# Patient Record
Sex: Female | Born: 1990
Health system: Southern US, Community
[De-identification: ages and names within clinical notes are randomized; demographics above are authoritative.]

## PROBLEM LIST (undated history)

## (undated) ENCOUNTER — Inpatient Hospital Stay (HOSPITAL_COMMUNITY): Payer: Self-pay

## (undated) DIAGNOSIS — O10019 Pre-existing essential hypertension complicating pregnancy, unspecified trimester: Secondary | ICD-10-CM

## (undated) DIAGNOSIS — R87629 Unspecified abnormal cytological findings in specimens from vagina: Secondary | ICD-10-CM

## (undated) DIAGNOSIS — E669 Obesity, unspecified: Secondary | ICD-10-CM

## (undated) DIAGNOSIS — I10 Essential (primary) hypertension: Secondary | ICD-10-CM

## (undated) DIAGNOSIS — O139 Gestational [pregnancy-induced] hypertension without significant proteinuria, unspecified trimester: Secondary | ICD-10-CM

## (undated) HISTORY — DX: Essential (primary) hypertension: I10

## (undated) HISTORY — DX: Gestational (pregnancy-induced) hypertension without significant proteinuria, unspecified trimester: O13.9

## (undated) HISTORY — DX: Obesity, unspecified: E66.9

## (undated) HISTORY — PX: LAPAROSCOPIC CHOLECYSTECTOMY W/ CHOLANGIOGRAPHY: SUR757

## (undated) HISTORY — DX: Unspecified abnormal cytological findings in specimens from vagina: R87.629

## (undated) HISTORY — DX: Pre-existing essential hypertension complicating pregnancy, unspecified trimester: O10.019

---
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
## 2009-08-12 ENCOUNTER — Inpatient Hospital Stay (HOSPITAL_COMMUNITY): Admission: AD | Admit: 2009-08-12 | Discharge: 2009-08-12 | Payer: Self-pay | Admitting: Obstetrics and Gynecology

## 2009-08-12 ENCOUNTER — Ambulatory Visit: Payer: Self-pay | Admitting: Nurse Practitioner

## 2009-08-15 ENCOUNTER — Inpatient Hospital Stay (HOSPITAL_COMMUNITY): Admission: AD | Admit: 2009-08-15 | Discharge: 2009-08-15 | Payer: Self-pay | Admitting: Obstetrics and Gynecology

## 2009-08-15 ENCOUNTER — Ambulatory Visit: Payer: Self-pay | Admitting: Nurse Practitioner

## 2009-08-22 ENCOUNTER — Ambulatory Visit (HOSPITAL_COMMUNITY): Admission: RE | Admit: 2009-08-22 | Discharge: 2009-08-22 | Payer: Self-pay | Admitting: Obstetrics and Gynecology

## 2009-08-22 ENCOUNTER — Ambulatory Visit: Payer: Self-pay | Admitting: Obstetrics and Gynecology

## 2009-08-22 ENCOUNTER — Inpatient Hospital Stay (HOSPITAL_COMMUNITY): Admission: AD | Admit: 2009-08-22 | Discharge: 2009-08-22 | Payer: Self-pay | Admitting: Family Medicine

## 2009-08-29 ENCOUNTER — Inpatient Hospital Stay (HOSPITAL_COMMUNITY): Admission: RE | Admit: 2009-08-29 | Discharge: 2009-08-29 | Payer: Self-pay | Admitting: Family Medicine

## 2009-08-29 ENCOUNTER — Ambulatory Visit: Payer: Self-pay | Admitting: Nurse Practitioner

## 2009-10-09 ENCOUNTER — Ambulatory Visit (HOSPITAL_COMMUNITY): Admission: RE | Admit: 2009-10-09 | Discharge: 2009-10-09 | Payer: Self-pay | Admitting: Family Medicine

## 2009-10-30 ENCOUNTER — Ambulatory Visit (HOSPITAL_COMMUNITY): Admission: RE | Admit: 2009-10-30 | Discharge: 2009-10-30 | Payer: Self-pay | Admitting: Family Medicine

## 2009-11-13 ENCOUNTER — Encounter: Payer: Self-pay | Admitting: Family Medicine

## 2009-11-13 ENCOUNTER — Ambulatory Visit (HOSPITAL_COMMUNITY): Admission: RE | Admit: 2009-11-13 | Discharge: 2009-11-13 | Payer: Self-pay | Admitting: Family Medicine

## 2009-12-04 ENCOUNTER — Ambulatory Visit (HOSPITAL_COMMUNITY)
Admission: RE | Admit: 2009-12-04 | Discharge: 2009-12-04 | Payer: Self-pay | Source: Home / Self Care | Admitting: Family Medicine

## 2009-12-04 ENCOUNTER — Encounter: Payer: Self-pay | Admitting: Family Medicine

## 2009-12-09 ENCOUNTER — Ambulatory Visit: Payer: Self-pay | Admitting: Family Medicine

## 2009-12-10 ENCOUNTER — Encounter: Payer: Self-pay | Admitting: Family Medicine

## 2009-12-15 ENCOUNTER — Inpatient Hospital Stay (HOSPITAL_COMMUNITY)
Admission: AD | Admit: 2009-12-15 | Discharge: 2009-12-15 | Payer: Self-pay | Source: Home / Self Care | Attending: Family Medicine | Admitting: Family Medicine

## 2009-12-25 ENCOUNTER — Encounter: Payer: Self-pay | Admitting: Family Medicine

## 2009-12-25 ENCOUNTER — Ambulatory Visit: Payer: Self-pay | Admitting: Family Medicine

## 2009-12-25 LAB — CONVERTED CEMR LAB
ALT: 18 units/L (ref 0–35)
AST: 13 units/L (ref 0–37)
Albumin: 3.4 g/dL — ABNORMAL LOW (ref 3.5–5.2)
Alkaline Phosphatase: 98 units/L (ref 39–117)
BUN: 6 mg/dL (ref 6–23)
CO2: 25 meq/L (ref 19–32)
Calcium: 9.1 mg/dL (ref 8.4–10.5)
Chloride: 104 meq/L (ref 96–112)
Creatinine, Ser: 0.66 mg/dL (ref 0.40–1.20)
Glucose, Bld: 80 mg/dL
Glucose, Bld: 138 mg/dL — ABNORMAL HIGH (ref 70–99)
Glucose, Urine, Semiquant: NEGATIVE
HCT: 32.3 % — ABNORMAL LOW (ref 36.0–46.0)
Hemoglobin: 11.9 g/dL
Hemoglobin: 10.6 g/dL — ABNORMAL LOW (ref 12.0–15.0)
MCHC: 32.8 g/dL (ref 30.0–36.0)
MCV: 94.7 fL (ref 78.0–100.0)
Platelets: 254 10*3/uL (ref 150–400)
Potassium: 3.8 meq/L (ref 3.5–5.3)
RBC: 3.41 M/uL — ABNORMAL LOW (ref 3.87–5.11)
RDW: 13.1 % (ref 11.5–15.5)
Sodium: 137 meq/L (ref 135–145)
Total Bilirubin: 0.3 mg/dL (ref 0.3–1.2)
Total Protein: 6.7 g/dL (ref 6.0–8.3)
WBC: 9.6 10*3/uL (ref 4.0–10.5)

## 2009-12-26 ENCOUNTER — Encounter: Payer: Self-pay | Admitting: Family Medicine

## 2009-12-26 ENCOUNTER — Ambulatory Visit: Payer: Self-pay | Admitting: Family Medicine

## 2009-12-26 LAB — CONVERTED CEMR LAB: Protein, 24H Urine: 45 mg/24hr — ABNORMAL LOW (ref 50–100)

## 2009-12-31 ENCOUNTER — Ambulatory Visit: Payer: Self-pay | Admitting: Family Medicine

## 2010-01-01 ENCOUNTER — Ambulatory Visit: Payer: Self-pay | Admitting: Family Medicine

## 2010-01-01 ENCOUNTER — Encounter: Payer: Self-pay | Admitting: Family Medicine

## 2010-01-01 DIAGNOSIS — O99810 Abnormal glucose complicating pregnancy: Secondary | ICD-10-CM | POA: Insufficient documentation

## 2010-01-09 ENCOUNTER — Ambulatory Visit
Admission: RE | Admit: 2010-01-09 | Discharge: 2010-01-09 | Payer: Self-pay | Source: Home / Self Care | Attending: Obstetrics and Gynecology | Admitting: Obstetrics and Gynecology

## 2010-01-09 LAB — POCT URINALYSIS DIPSTICK
Bilirubin Urine: NEGATIVE
Hemoglobin, Urine: NEGATIVE
Ketones, ur: NEGATIVE mg/dL
Nitrite: NEGATIVE
Protein, ur: NEGATIVE mg/dL
Specific Gravity, Urine: 1.025 (ref 1.005–1.030)
Urine Glucose, Fasting: NEGATIVE mg/dL
Urobilinogen, UA: 1 mg/dL (ref 0.0–1.0)
pH: 6 (ref 5.0–8.0)

## 2010-01-23 ENCOUNTER — Encounter: Payer: Self-pay | Admitting: Family Medicine

## 2010-01-23 ENCOUNTER — Ambulatory Visit
Admission: RE | Admit: 2010-01-23 | Discharge: 2010-01-23 | Payer: Self-pay | Source: Home / Self Care | Attending: Family Medicine | Admitting: Family Medicine

## 2010-01-23 ENCOUNTER — Encounter: Payer: Self-pay | Admitting: Obstetrics & Gynecology

## 2010-01-23 LAB — CONVERTED CEMR LAB
ALT: 17 units/L (ref 0–35)
AST: 13 units/L (ref 0–37)
Albumin: 3.7 g/dL (ref 3.5–5.2)
Alkaline Phosphatase: 108 units/L (ref 39–117)
BUN: 7 mg/dL (ref 6–23)
CO2: 25 meq/L (ref 19–32)
Calcium: 9.3 mg/dL (ref 8.4–10.5)
Chloride: 104 meq/L (ref 96–112)
Creatinine, Ser: 0.54 mg/dL (ref 0.40–1.20)
Creatinine, Urine: 61.2 mg/dL
Glucose, Bld: 76 mg/dL (ref 70–99)
HCT: 34.9 % — ABNORMAL LOW (ref 36.0–46.0)
HIV: NONREACTIVE
Hemoglobin: 11.4 g/dL — ABNORMAL LOW (ref 12.0–15.0)
MCHC: 32.7 g/dL (ref 30.0–36.0)
MCV: 95.4 fL (ref 78.0–100.0)
Platelets: 250 10*3/uL (ref 150–400)
Potassium: 4.1 meq/L (ref 3.5–5.3)
RBC: 3.66 M/uL — ABNORMAL LOW (ref 3.87–5.11)
RDW: 12.8 % (ref 11.5–15.5)
Sodium: 135 meq/L (ref 135–145)
Total Bilirubin: 0.4 mg/dL (ref 0.3–1.2)
Total Protein, Urine: <3
Total Protein: 6.8 g/dL (ref 6.0–8.3)
Uric Acid, Serum: 4.5 mg/dL (ref 2.4–7.0)
WBC: 8 10*3/uL (ref 4.0–10.5)

## 2010-01-24 ENCOUNTER — Encounter: Payer: Self-pay | Admitting: Obstetrics & Gynecology

## 2010-01-24 LAB — CONVERTED CEMR LAB
Collection Interval-CRCL: 24 hr
Creatinine 24 HR UR: 984 mg/24hr (ref 700–1800)
Creatinine Clearance: 127 mL/min — ABNORMAL HIGH (ref 75–115)
Creatinine, Urine: 75.7 mg/dL
Protein, 24H Urine: 39 mg/24hr — ABNORMAL LOW (ref 50–100)

## 2010-01-27 LAB — POCT URINALYSIS DIPSTICK
Bilirubin Urine: NEGATIVE
Hgb urine dipstick: NEGATIVE
Ketones, ur: NEGATIVE mg/dL
Nitrite: NEGATIVE
Protein, ur: NEGATIVE mg/dL
Specific Gravity, Urine: 1.01 (ref 1.005–1.030)
Urine Glucose, Fasting: NEGATIVE mg/dL
Urobilinogen, UA: 1 mg/dL (ref 0.0–1.0)
pH: 6.5 (ref 5.0–8.0)

## 2010-01-30 ENCOUNTER — Ambulatory Visit
Admission: RE | Admit: 2010-01-30 | Discharge: 2010-01-30 | Payer: Self-pay | Source: Home / Self Care | Attending: Obstetrics and Gynecology | Admitting: Obstetrics and Gynecology

## 2010-01-30 LAB — POCT URINALYSIS DIPSTICK
Bilirubin Urine: NEGATIVE
Hgb urine dipstick: NEGATIVE
Ketones, ur: NEGATIVE mg/dL
Nitrite: NEGATIVE
Protein, ur: NEGATIVE mg/dL
Specific Gravity, Urine: <=1.005 (ref 1.005–1.030)
Urine Glucose, Fasting: NEGATIVE mg/dL
Urobilinogen, UA: 0.2 mg/dL (ref 0.0–1.0)
pH: 5.5 (ref 5.0–8.0)

## 2010-01-31 ENCOUNTER — Ambulatory Visit (HOSPITAL_COMMUNITY)
Admission: RE | Admit: 2010-01-31 | Discharge: 2010-01-31 | Payer: Self-pay | Source: Home / Self Care | Attending: Obstetrics & Gynecology | Admitting: Obstetrics & Gynecology

## 2010-02-04 NOTE — Miscellaneous (Signed)
Summary: ROI  ROI   Imported By: De Nurse 12/12/2009 16:27:20  _____________________________________________________________________  External Attachment:    Type:   Image     Comment:   External Document

## 2010-02-04 NOTE — Assessment & Plan Note (Signed)
Summary: np,df   Vital Signs:  Patient profile:   20 year old female Height:      65 inches Weight:      245.5 pounds Pulse rate:   80 / minute BP sitting:   130 / 80  (right arm)  Vitals Entered By: Arlyss Repress CMA, (December 09, 2009 2:57 PM)  History of Present Illness: pt is seeking to establish/transfer care for her pregnancy.  she is a 20yo G1P0 at 21.6 wks by LMP c/w 17 wk sono without any acute complaints today.  she denies any contractions, bleeding, spotting, leakage of fluid, and notes good fetal movement.  she has been followed at the health department and per her report denies any complications with this pregnancy.   Past History:  Past Medical History: none  Past Surgical History: none  Family History: sis with preeclampsia/chronic hypertension  Social History: Alcohol use-no Drug use-no no tobaccoDrug Use:  no  Physical Exam  General:  Well-developed,well-nourished,in no acute distress; alert,appropriate and cooperative throughout examination Lungs:  Normal respiratory effort, chest expands symmetrically. Lungs are clear to auscultation, no crackles or wheezes. Heart:  Normal rate and regular rhythm. S1 and S2 normal without gallop, murmur, click, rub or other extra sounds. Abdomen:  Bowel sounds positive,abdomen soft and non-tender without masses, organomegaly or hernias noted.Gravid, FH 27-obese. FHR 150s Extremities:  No clubbing, cyanosis, edema, or deformity noted with normal full range of motion of all joints.     Impression & Recommendations:  Problem # 1:  INTRAUTERINE PREGNANCY (ICD-V22.2) Assessment New labs and ultrasound reviewed.  pt is at 21.6 wks by LMP c/w 17 wk ultrasound and most recent ultrasound from 11/30 with adequate growth and is consistent with LMP.  will have pt to establish care with continuity physician.  pt with borderline BP manual 132/84.  will have her to return in 2 weeks for BP check and ROB visit.  pt currently  asymptomatic.  will obtain medical records from the health department and will review at next visit.   Patient Instructions: 1)  It was a pleasure to meet with you today.  2)  Please make a follow up appointment in 2 weeks for BP check and ROB visit.  please assign continuity MD, either PGY1 or PGY2. thanks.  3)  Return to the Emergency department if with any contractions, bleeding, spotting, decreased fetal movement, leakage of fluid, or any other concerning symptoms.  4)  Please sign release of information for prenatal records from health department.    Orders Added: 1)  [facility]- New Level 3 [99203]     OB Initial Intake Information    Race: Black    Marital status: Single   Flowsheet View for Follow-up Visit    Weight:     245.5    Blood pressure:   130 / 80

## 2010-02-06 ENCOUNTER — Encounter: Payer: Self-pay | Admitting: Obstetrics & Gynecology

## 2010-02-06 ENCOUNTER — Other Ambulatory Visit: Payer: Self-pay

## 2010-02-06 DIAGNOSIS — IMO0002 Reserved for concepts with insufficient information to code with codable children: Secondary | ICD-10-CM

## 2010-02-06 DIAGNOSIS — O139 Gestational [pregnancy-induced] hypertension without significant proteinuria, unspecified trimester: Secondary | ICD-10-CM

## 2010-02-06 LAB — POCT URINALYSIS DIPSTICK
Hgb urine dipstick: NEGATIVE
Nitrite: NEGATIVE
Protein, ur: NEGATIVE mg/dL
Specific Gravity, Urine: 1.025 (ref 1.005–1.030)
Urine Glucose, Fasting: NEGATIVE mg/dL
Urobilinogen, UA: 1 mg/dL (ref 0.0–1.0)
pH: 6 (ref 5.0–8.0)

## 2010-02-06 LAB — CONVERTED CEMR LAB: TSH: 0.649 microintl units/mL (ref 0.350–4.500)

## 2010-02-06 NOTE — Assessment & Plan Note (Signed)
Summary: NOB/TRANSFER FROM HD/DSL   Vital Signs:  Patient profile:   20 year old female LMP:     07/09/2009 Height:      65 inches Weight:      245.5 pounds BMI:     41.00 Temp:     98.4 degrees F oral Pulse rate:   86 / minute BP sitting:   151 / 87  (left arm) Cuff size:   large  Vitals Entered By: Jimmy Footman, CMA (December 25, 2009 1:41 PM)  Serial Vital Signs/Assessments:  Time      Position  BP       Pulse  Resp  Temp     By                     142/76                         Lloyd Huger MD                     130/78                         Lloyd Huger MD  CC: New OB from HD/ 24    1/7 Is Patient Diabetic? No Pain Assessment Patient in pain? no       Have you ever been in a relationship where you felt threatened, hurt or afraid?No  LMP (date): 07/09/2009 EDC by LMP==> 04/15/2010 EDC 04/15/2010 LMP - Reliable? YES     On BCP's at conception: no Enter LMP: 07/09/2009   Primary Provider:  Maryelizabeth Kaufmann MD  CC:  New OB from HD/ 24    1/7.  History of Present Illness: 20 yo G1 at 24.1 weeks for routine OB visit. No complications thus far. On review of records, has had SBP ranging 136-138 and DBP 81-86 throughout first and second trimesters. Today she has elevated to 151/87 with manual recheck 142/76. Denies any HA, RUQ pain, pedal edema, visual changes.   Habits & Providers  Alcohol-Tobacco-Diet     Tobacco Status: never     Cigarette Packs/Day: n/a  Past History:  Past Medical History: Last updated: 12/09/2009 none  Past Surgical History: Last updated: 12/09/2009 none  Family History: Last updated: 12/09/2009 sis with preeclampsia/chronic hypertension  Social History: Last updated: 12/09/2009 Alcohol use-no Drug use-no no tobacco  Risk Factors: Smoking Status: never (12/25/2009) Packs/Day: n/a (12/25/2009)  Social History: Smoking Status:  never Occupation:  [employer] Hepatitis Risk:  no Packs/Day:  n/a  Review of Systems  The  patient denies anorexia, fever, weight loss, weight gain, chest pain, syncope, dyspnea on exertion, peripheral edema, prolonged cough, headaches, abdominal pain, and severe indigestion/heartburn.    Physical Exam  General:  Well-developed,well-nourished,in no acute distress; alert,appropriate and cooperative throughout examination Head:  normocephalic and atraumatic.   Eyes:  No corneal or conjunctival inflammation noted. EOMI. Perrla. Funduscopic exam benign, without hemorrhages, exudates or papilledema. Vision grossly normal. Lungs:  Normal respiratory effort, chest expands symmetrically. Lungs are clear to auscultation, no crackles or wheezes. Heart:  Normal rate and regular rhythm. S1 and S2 normal without gallop, murmur, click, rub or other extra sounds. Abdomen:  Gravid, Fundal height 25 cm. Nontender, BS+ Extremities:  No clubbing, cyanosis, edema, or deformity noted with normal full range of motion of all joints.   Neurologic:  No cranial nerve deficits noted. Station and gait are  normal. Plantar reflexes are down-going bilaterally. DTRs are symmetrical throughout. Sensory, motor and coordinative functions appear intact. Skin:  Intact without suspicious lesions or rashes Psych:  Cognition and judgment appear intact. Alert and cooperative with normal attention span and concentration. No apparent delusions, illusions, hallucinations  PHQ9 score is 1. Additional Exam:  (10/02/09 routine labs at HD): HIV NR, HepBsAg neg, RPR neg, blood type O+, Antibody screen negative, Rubella Imm, Gonorrhea/chlamydia negative, urine ctx no growth, 1hr GTT 80 Integrated screen Negative (10/30/09)   Impression & Recommendations:  Problem # 1:  INTRAUTERINE PREGNANCY (ICD-V22.2) Patient has hypertension, possibly pre-existing. No current symptoms preeclampsia, will check routine labs to rule out. Refer to [facility] for hypertension in pregnancy management. Patient will follow up tomorrow for work-in appointment  and blood pressure check. If protein is present in urine, will need 24 hour urine collection and closer follow up.   Orders: UA Glucose/Protein-[facility] (81002) Comp Met-[facility] ([id number]) CBC-[facility] (09811) Medicaid OB visit - [facility] (99213)Future Orders: Glucose 1 hr-[facility] (91478) ... 12/27/2009  Patient Instructions: 1)  Nice to meet you. 2)  Will schedule you for an appointment tomorrow to check blood pressure. It is on the borderline high.  3)  Your baby's heart tones are great!   Orders Added: 1)  UA Glucose/Protein-[facility] [81002] 2)  Comp Met-[facility] [[id number]] 3)  CBC-[facility] [85027] 4)  Glucose 1 hr-[facility] [82950] 5)  Medicaid OB visit - [facility] [[id number]]     OB Initial Intake Information    Positive HCG by: [facility]    Race: Black    Marital status: Single    Occupation: outside work    Type of work: [employer]    Number of children at home: 0  FOB Information    Husband/Father of baby: Marcia Brash    FOB occupation Landscaping    FOB Comments: Boyfriend, supportive  Menstrual History    LMP (date): 07/09/2009    EDC by LMP: 04/15/2010    Best Working EDC: 04/15/2010    LMP - Reliable? : YES    On BCP's at conception: no    Pre Pregnancy Weight: 244 lbs.    Symptoms since LMP: fatigue Prenatal Visit    FOB name: Marcia Brash [facility] Confirmation:    New working [facility]: 04/15/2010    LMP reliable? YES    Last menses onset (LMP) date: 07/09/2009    EDC by LMP: 04/15/2010 Ultrasound Dating Information:    First U/S on 08/29/2009   Gest age: 21.5   EDC: 04/09/2010.    Second U/S on 11/13/2009   Gest age: 40.6   EDC: 04/17/2010.   Genetic History     Thalassemia:     mother: no    Neural tube defect:   mother: no    Down's Syndrome:   mother: no    Tay-Sachs:     mother: no    Sickle Cell Dz/Trait:   mother: no    Hemophilia:     mother: no    Muscular Dystrophy:   mother: no    Cystic Fibrosis:   mother: no    Huntington's Dz:   mother: no    Mental Retardation:   mother: no    Fragile  X:     mother: no    Other Genetic or       Chromosomal Dz:   mother: no    Child with other       birth defect:     mother: no    >  3 spont. abortions:   mother: no    Hx of stillbirth:     mother: no  Infection Risk History    High Risk Hepatitis B: no    Immunized against Hepatitis B: yes    Exposure to TB: no    Patient with history of Genital Herpes: no    Sexual partner with history of Genital Herpes: no    History of STD (GC, Chlamydia, Syphilis, HPV): no    Rash, Viral, or Febrile Illness since LMP: no    Exposure to Cat Litter: no    Chicken Pox Immune Status: Hx of Disease: Immune    Occupational Exposure to Children: other  Environmental Exposures    Xray Exposure since LMP: no    Chemical or other exposure: no    Medication, drug, or alcohol use since LMP: no   Flowsheet View for Follow-up Visit    Estimated weeks of       gestation:     24 1/7    Weight:     245.5    Blood pressure:   151 / 87    Urine Protein:     trace    Urine Glucose:   negative    Headache:     No    Nausea/vomiting:   No    Edema:     0    Vaginal bleeding:   no    Vaginal discharge:   no    Fundal height:      25    FHR:       140    Fetal activity:     yes    Labor symptoms:   no    Fetal position:     N/A    Taking prenatal vits?   Y    Smoking:     n/a    Next visit:     4 wk    Laboratory Results   Urine Tests  Date/Time Received: December 25, 2009 2:35 PM  Date/Time Reported: December 25, 2009 2:57 PM   Routine Urinalysis   Glucose: negative   (Normal Range: Negative) Protein: trace   (Normal Range: Negative)    Comments: ...............test performed by......Marland KitchenBonnie A. [place]az[place], MLS (ASCP)cm   CBC   HGB:  11.9 g/dL   (Normal Range: 86.5-78.4 in Males, 12.0-15.0 in Females)     Prevention & Chronic Care Immunizations   Influenza vaccine: Not documented    Tetanus booster: Not documented    Pneumococcal vaccine: Not documented  Other Screening    Pap smear: Not documented   Pap smear action/deferral: Not indicated-other  (12/25/2009)   Smoking status: never  (12/25/2009)     Lab Results    Hgb:     11.9 g/dL    Glu:     80  mg/dL    LMP:      69/62/9528 Urinalysis:      Prot:     trace    Glu:     negative    Appended Document: NOB/TRANSFER FROM HD/DSL Patient chart and old records from Health Dept reviewed, case discussed with Dr. Cristal Ford.  Patient with history of borderline HTN, elevated initial pressure on today's visit.  She failed her 1hrGTT today.  I agree with checking pre-eclampsia labs today, recheck BP tomorrow as well.  Will refer to [facility] for evaluation of patient with suspected essential hypertension, check 3hrGTT and refer to [facility].

## 2010-02-06 NOTE — Letter (Signed)
Summary: *Referral Letter  [facility]  [address]   [place], [place] [address]   Phone: [phone]  Fax: [phone]    12/25/2009  Thank you in advance for agreeing to see my patient:  Carol Blair [address] [place], [place]  [address]  Phone: [phone]  Reason for Referral: Hypertension in Pregnancy  Carol Blair is a pleasant 19yo G1 with uncomplicated pregancy found to have elevated blood pressures consistently 130/80s range. Today in the office she has 151/87 with recheck 142/76. We are checking urine studies and routine preeclampsia labs, but would like her to be established in [facility] for her hypertension.  Current Medical Problems: 1)  INTRAUTERINE PREGNANCY (ICD-V22.2)   Current Medications: PNV   Past Medical History: 1)  none   Pertinent Labs: Prenatal labs negative.    Thank you again for agreeing to see our patient; please contact us if you have any further questions or need additional information.  Sincerely,  Lloyd Huger MD

## 2010-02-06 NOTE — Assessment & Plan Note (Signed)
Summary: F/U BP/[facility]   Vital Signs:  Patient profile:   20 year old female Height:      65 inches Weight:      247 pounds Temp:     97.7 degrees F oral Pulse rate:   68 / minute BP sitting:   133 / 80  Vitals Entered By: Tessie Fass CMA (December 26, 2009 3:31 PM) CC: F/U BP (24.2 wks) Pain Assessment Patient in pain? no        Primary Care Provider:  Maryelizabeth Kaufmann MD  CC:  F/U BP (24.2 wks).  History of Present Illness: 20 yo G1 at 24.2 weeks for follow up for elevated BP to  151/87 with manual recheck 142/76.Marland Kitchen No complications thus far and normal pre-ecclampsia labs. 24 hr urine done today. BP 133/80 today. Denies prior history of hypertension. Has had SBP ranging 136-138 and DBP 81-86 throughout first and second trimesters.    Denies any HA, RUQ pain, pedal edema, visual changes.   Habits & Providers  Alcohol-Tobacco-Diet     Cigarette Packs/Day: n/a  Physical Exam  Abdomen:  Gravid, Fundal height 25 cm. Nontender, BS+   Impression & Recommendations:  Problem # 1:  INTRAUTERINE PREGNANCY (ICD-V22.2) 19 yo G1 at 24.2 weeks with elevated BP. Normotensive (though still somewhat elevated for pregnancy) today. PIH labs wnl. 24 hour urine pending. Patient to follow up next week for blood pressure recheck and fetal dopplers, then follow up with [facility]. Also needs 3 hr GTT for failed one hour. Discussed with Dr. Mauricio Po who agrees with plan as outlined.   Orders: 24hr. Urine TP- [facility] [phone]) Medicaid OB visit - [facility] [phone])  Patient Instructions: 1)  Make an appointment for Wednesday  2)  If you have any bleeding or contractions go straight to [facility]  3)  Come in on Wednesday as well for your 3 hr glucose test.    Orders Added: 1)  24hr. Urine TP- [facility] [[id number]] 2)  Medicaid OB visit - [facility] [[id number]]     Flowsheet View for Follow-up Visit    Estimated weeks of       gestation:     24 2/7    Weight:     247    Blood pressure:   133 /  80    Hx headache?     No    Nausea/vomiting?   No    Edema?     0    Bleeding?     no    Leakage/discharge?   no    Fetal activity:       yes    Labor symptoms?   no    Fundal height:      25    FHR:       156    Fetal position:      N/A    Taking Vitamins?   Y    Smoking PPD:   n/a    Comment:     Follow up on Wednesday, then at [facility] clinic; 3 hr GTT Wednesday    Resident:     Wallene Huh

## 2010-02-06 NOTE — Assessment & Plan Note (Signed)
Summary: f/u HTN per Dr Wallene Huh   Vital Signs:  Patient profile:   20 year old female Height:      65 inches Weight:      247.6 pounds BMI:     41.35 Temp:     97.8 degrees F oral Pulse rate:   55 / minute BP sitting:   135 / 80  (left arm) Cuff size:   regular  Vitals Entered By: Jimmy Footman, CMA (December 31, 2009 10:58 AM) CC: HTN follow up Is Patient Diabetic? No Pain Assessment Patient in pain? no        Primary Care Carol Blair:  Carol Kaufmann MD  CC:  HTN follow up.  History of Present Illness: Pt was seen by Dr. Wallene Huh and had high BP the day of the visit. She has normal PIH labs and her 24 hour urine had low protein at 45. She has not had any headaches, RUQ, blurry vision, leg swelling or any other symptoms.  She has an appointment tomorrow to get her 3 hr GTT and an appointment with [facility] clinic on Jan 5th. BP is better today!  Habits & Providers  Alcohol-Tobacco-Diet     Tobacco Status: never     Cigarette Packs/Day: n/a  Current Medications (verified): 1)  Prenatal Vitamins .... Take 1 Daily While Pregnant and Breast Feeding.  Allergies (verified): No Known Drug Allergies  Review of Systems        vitals reviewed and pertinent negatives and positives seen in HPI   Physical Exam  General:  Well-developed,well-nourished,in no acute distress; alert,appropriate and cooperative throughout examination Abdomen:  gravid, 25 cm, no tenderness Psych:  Cognition and judgment appear intact. Alert and cooperative with normal attention span and concentration. No apparent delusions, illusions, hallucinations   Impression & Recommendations:  Problem # 1:  INTRAUTERINE PREGNANCY (ICD-V22.2) Assessment Improved There was concern for pre-eclampsia but the patient is not having any signs or symptoms of it now. Her BP is back down into her normal range (130's). She has never been told that she is hypertensive prior to pregnancy. She has a sister who had pre-eclampsia. She  is being evaluated at [facility] clinic on Jan 5th. Pt is also havnig her 3 hr GTT tomorrow. Pt given verbal instructions to follow up with coming appointments. She verbalized consent.   Orders: Medicaid OB visit - [facility] ([id number])  Complete Medication List: 1)  Prenatal Vitamins  .... Take 1 daily while pregnant and breast feeding.   Orders Added: 1)  Medicaid OB visit - [facility] [[id number]]     Flowsheet View for Follow-up Visit    Estimated weeks of       gestation:     25 0/7    Weight:     247.6    Blood pressure:   135 / 80    Hx headache?     No    Nausea/vomiting?   No    Edema?     0    Bleeding?     no    Leakage/discharge?   no    Fetal activity:       yes    Labor symptoms?   no    Fundal height:      25    FHR:       155    Fetal position:      N/A    Taking Vitamins?   Y    Smoking PPD:   n/a    Comment:  come in tomorrow for 3 GTT    Next visit:     1 d    Resident:     Clotilde Dieter

## 2010-02-12 ENCOUNTER — Encounter: Payer: Self-pay | Admitting: *Deleted

## 2010-02-20 ENCOUNTER — Other Ambulatory Visit: Payer: Self-pay

## 2010-02-20 DIAGNOSIS — O139 Gestational [pregnancy-induced] hypertension without significant proteinuria, unspecified trimester: Secondary | ICD-10-CM

## 2010-02-20 DIAGNOSIS — O0990 Supervision of high risk pregnancy, unspecified, unspecified trimester: Secondary | ICD-10-CM

## 2010-02-20 LAB — POCT URINALYSIS DIPSTICK
Bilirubin Urine: NEGATIVE
Ketones, ur: NEGATIVE mg/dL
Nitrite: NEGATIVE
Protein, ur: NEGATIVE mg/dL
Specific Gravity, Urine: <=1.005 (ref 1.005–1.030)
Urine Glucose, Fasting: NEGATIVE mg/dL
Urobilinogen, UA: 0.2 mg/dL (ref 0.0–1.0)
pH: 5.5 (ref 5.0–8.0)

## 2010-02-24 ENCOUNTER — Other Ambulatory Visit: Payer: Medicaid Other

## 2010-02-24 DIAGNOSIS — O139 Gestational [pregnancy-induced] hypertension without significant proteinuria, unspecified trimester: Secondary | ICD-10-CM

## 2010-02-27 ENCOUNTER — Other Ambulatory Visit: Payer: Self-pay | Admitting: Obstetrics & Gynecology

## 2010-02-27 ENCOUNTER — Other Ambulatory Visit: Payer: Medicaid Other

## 2010-02-27 ENCOUNTER — Other Ambulatory Visit: Payer: Self-pay

## 2010-02-27 DIAGNOSIS — O139 Gestational [pregnancy-induced] hypertension without significant proteinuria, unspecified trimester: Secondary | ICD-10-CM

## 2010-02-27 LAB — POCT URINALYSIS DIPSTICK
Bilirubin Urine: NEGATIVE
Hgb urine dipstick: NEGATIVE
Ketones, ur: NEGATIVE mg/dL
Nitrite: NEGATIVE
Protein, ur: NEGATIVE mg/dL
Specific Gravity, Urine: <=1.005 (ref 1.005–1.030)
Urine Glucose, Fasting: NEGATIVE mg/dL
Urobilinogen, UA: 0.2 mg/dL (ref 0.0–1.0)
pH: 6 (ref 5.0–8.0)

## 2010-03-03 ENCOUNTER — Other Ambulatory Visit: Payer: Medicaid Other

## 2010-03-03 DIAGNOSIS — O139 Gestational [pregnancy-induced] hypertension without significant proteinuria, unspecified trimester: Secondary | ICD-10-CM

## 2010-03-06 ENCOUNTER — Encounter (HOSPITAL_COMMUNITY): Payer: Self-pay

## 2010-03-06 ENCOUNTER — Ambulatory Visit (HOSPITAL_COMMUNITY)
Admission: RE | Admit: 2010-03-06 | Discharge: 2010-03-06 | Disposition: A | Discharge status: Home / Self Care | Payer: Medicaid Other | Source: Ambulatory Visit | Attending: Obstetrics & Gynecology | Admitting: Obstetrics & Gynecology

## 2010-03-06 ENCOUNTER — Other Ambulatory Visit: Payer: Self-pay

## 2010-03-06 ENCOUNTER — Other Ambulatory Visit: Payer: Medicaid Other

## 2010-03-06 DIAGNOSIS — O139 Gestational [pregnancy-induced] hypertension without significant proteinuria, unspecified trimester: Secondary | ICD-10-CM

## 2010-03-06 DIAGNOSIS — Z3689 Encounter for other specified antenatal screening: Secondary | ICD-10-CM | POA: Insufficient documentation

## 2010-03-06 LAB — POCT URINALYSIS DIPSTICK
Nitrite: NEGATIVE
Urobilinogen, UA: 1 mg/dL (ref 0.0–1.0)
pH: 7 (ref 5.0–8.0)

## 2010-03-10 ENCOUNTER — Other Ambulatory Visit: Payer: Medicaid Other

## 2010-03-10 DIAGNOSIS — O139 Gestational [pregnancy-induced] hypertension without significant proteinuria, unspecified trimester: Secondary | ICD-10-CM

## 2010-03-13 ENCOUNTER — Other Ambulatory Visit: Payer: Self-pay

## 2010-03-13 ENCOUNTER — Other Ambulatory Visit: Payer: Medicaid Other

## 2010-03-13 DIAGNOSIS — O165 Unspecified maternal hypertension, complicating the puerperium: Secondary | ICD-10-CM

## 2010-03-13 LAB — POCT URINALYSIS DIPSTICK
Hgb urine dipstick: NEGATIVE
Ketones, ur: NEGATIVE mg/dL
Nitrite: NEGATIVE
Protein, ur: NEGATIVE mg/dL
pH: 7.5 (ref 5.0–8.0)

## 2010-03-17 ENCOUNTER — Other Ambulatory Visit: Payer: Medicaid Other

## 2010-03-17 DIAGNOSIS — O139 Gestational [pregnancy-induced] hypertension without significant proteinuria, unspecified trimester: Secondary | ICD-10-CM

## 2010-03-18 LAB — URINALYSIS, ROUTINE W REFLEX MICROSCOPIC
Glucose, UA: NEGATIVE mg/dL
Hgb urine dipstick: NEGATIVE
Protein, ur: NEGATIVE mg/dL
pH: 7 (ref 5.0–8.0)

## 2010-03-20 ENCOUNTER — Encounter: Payer: Self-pay | Admitting: *Deleted

## 2010-03-20 ENCOUNTER — Other Ambulatory Visit: Payer: Medicaid Other

## 2010-03-20 ENCOUNTER — Other Ambulatory Visit: Payer: Self-pay

## 2010-03-20 DIAGNOSIS — O139 Gestational [pregnancy-induced] hypertension without significant proteinuria, unspecified trimester: Secondary | ICD-10-CM

## 2010-03-20 LAB — POCT URINALYSIS DIPSTICK
Glucose, UA: NEGATIVE mg/dL
Hgb urine dipstick: NEGATIVE
Ketones, ur: NEGATIVE mg/dL
Specific Gravity, Urine: 1.015 (ref 1.005–1.030)
Urobilinogen, UA: 1 mg/dL (ref 0.0–1.0)

## 2010-03-20 LAB — CONVERTED CEMR LAB
Chlamydia, DNA Probe: NEGATIVE
GC Probe Amp, Genital: NEGATIVE

## 2010-03-21 LAB — WET PREP, GENITAL

## 2010-03-21 LAB — URINALYSIS, ROUTINE W REFLEX MICROSCOPIC
Bilirubin Urine: NEGATIVE
Ketones, ur: 15 mg/dL — AB
Nitrite: NEGATIVE
Protein, ur: NEGATIVE mg/dL
Specific Gravity, Urine: >1.03 — ABNORMAL HIGH (ref 1.005–1.030)
Urobilinogen, UA: 4 mg/dL — ABNORMAL HIGH (ref 0.0–1.0)

## 2010-03-21 LAB — GC/CHLAMYDIA PROBE AMP, GENITAL: GC Probe Amp, Genital: NEGATIVE

## 2010-03-21 LAB — CBC
Hemoglobin: 11.8 g/dL — ABNORMAL LOW (ref 12.0–15.0)
Platelets: 283 10*3/uL (ref 150–400)
RBC: 3.84 MIL/uL — ABNORMAL LOW (ref 3.87–5.11)
WBC: 6.7 10*3/uL (ref 4.0–10.5)

## 2010-03-21 LAB — ABO/RH: ABO/RH(D): O POS

## 2010-03-21 LAB — HCG, QUANTITATIVE, PREGNANCY: hCG, Beta Chain, Quant, S: 434 m[IU]/mL — ABNORMAL HIGH (ref <5)

## 2010-03-24 ENCOUNTER — Other Ambulatory Visit: Payer: Medicaid Other

## 2010-03-24 DIAGNOSIS — O139 Gestational [pregnancy-induced] hypertension without significant proteinuria, unspecified trimester: Secondary | ICD-10-CM

## 2010-03-27 ENCOUNTER — Encounter: Payer: Self-pay | Admitting: Physician Assistant

## 2010-03-27 ENCOUNTER — Other Ambulatory Visit: Payer: Self-pay | Admitting: Obstetrics & Gynecology

## 2010-03-27 ENCOUNTER — Other Ambulatory Visit: Payer: Medicaid Other

## 2010-03-27 DIAGNOSIS — O139 Gestational [pregnancy-induced] hypertension without significant proteinuria, unspecified trimester: Secondary | ICD-10-CM

## 2010-03-27 DIAGNOSIS — Z331 Pregnant state, incidental: Secondary | ICD-10-CM

## 2010-03-27 DIAGNOSIS — E669 Obesity, unspecified: Secondary | ICD-10-CM

## 2010-03-27 DIAGNOSIS — O99210 Obesity complicating pregnancy, unspecified trimester: Secondary | ICD-10-CM

## 2010-03-27 LAB — POCT URINALYSIS DIP (DEVICE)
Protein, ur: NEGATIVE mg/dL
Urobilinogen, UA: 0.2 mg/dL (ref 0.0–1.0)
pH: 7 (ref 5.0–8.0)

## 2010-03-27 LAB — CONVERTED CEMR LAB

## 2010-03-31 ENCOUNTER — Ambulatory Visit (HOSPITAL_COMMUNITY): Payer: Medicaid Other

## 2010-03-31 ENCOUNTER — Ambulatory Visit (HOSPITAL_COMMUNITY)
Admission: RE | Admit: 2010-03-31 | Discharge: 2010-03-31 | Disposition: A | Discharge status: Home / Self Care | Payer: Medicaid Other | Source: Ambulatory Visit | Attending: Obstetrics & Gynecology | Admitting: Obstetrics & Gynecology

## 2010-03-31 ENCOUNTER — Other Ambulatory Visit: Payer: Medicaid Other

## 2010-03-31 DIAGNOSIS — O139 Gestational [pregnancy-induced] hypertension without significant proteinuria, unspecified trimester: Secondary | ICD-10-CM | POA: Insufficient documentation

## 2010-03-31 DIAGNOSIS — Z3689 Encounter for other specified antenatal screening: Secondary | ICD-10-CM | POA: Insufficient documentation

## 2010-04-03 ENCOUNTER — Other Ambulatory Visit: Payer: Self-pay | Admitting: Family Medicine

## 2010-04-03 ENCOUNTER — Inpatient Hospital Stay (HOSPITAL_COMMUNITY): Payer: Medicaid Other

## 2010-04-03 ENCOUNTER — Other Ambulatory Visit: Payer: Medicaid Other

## 2010-04-03 ENCOUNTER — Inpatient Hospital Stay (HOSPITAL_COMMUNITY)
Admission: AD | Admit: 2010-04-03 | Discharge: 2010-04-03 | Disposition: A | Discharge status: Home / Self Care | Payer: Medicaid Other | Source: Ambulatory Visit | Attending: Obstetrics & Gynecology | Admitting: Obstetrics & Gynecology

## 2010-04-03 DIAGNOSIS — O99210 Obesity complicating pregnancy, unspecified trimester: Secondary | ICD-10-CM

## 2010-04-03 DIAGNOSIS — O139 Gestational [pregnancy-induced] hypertension without significant proteinuria, unspecified trimester: Secondary | ICD-10-CM | POA: Insufficient documentation

## 2010-04-03 DIAGNOSIS — Z331 Pregnant state, incidental: Secondary | ICD-10-CM

## 2010-04-03 DIAGNOSIS — E669 Obesity, unspecified: Secondary | ICD-10-CM

## 2010-04-03 LAB — PROTEIN / CREATININE RATIO, URINE
Creatinine, Urine: 111.4 mg/dL
Protein Creatinine Ratio: 0.1 (ref 0.00–0.15)
Total Protein, Urine: 11 mg/dL

## 2010-04-03 LAB — COMPREHENSIVE METABOLIC PANEL
Albumin: 2.9 g/dL — ABNORMAL LOW (ref 3.5–5.2)
Alkaline Phosphatase: 122 U/L — ABNORMAL HIGH (ref 39–117)
BUN: 5 mg/dL — ABNORMAL LOW (ref 6–23)
Chloride: 103 mEq/L (ref 96–112)
Creatinine, Ser: 0.73 mg/dL (ref 0.4–1.2)
GFR calc non Af Amer: >60 mL/min (ref >60)
Glucose, Bld: 78 mg/dL (ref 70–99)
Potassium: 3.9 mEq/L (ref 3.5–5.1)
Total Bilirubin: 0.5 mg/dL (ref 0.3–1.2)

## 2010-04-03 LAB — CBC
HCT: 37 % (ref 36.0–46.0)
MCH: 30.8 pg (ref 26.0–34.0)
MCV: 95.1 fL (ref 78.0–100.0)
Platelets: 207 10*3/uL (ref 150–400)
RBC: 3.89 MIL/uL (ref 3.87–5.11)
WBC: 8.8 10*3/uL (ref 4.0–10.5)

## 2010-04-03 LAB — POCT URINALYSIS DIP (DEVICE)
Glucose, UA: NEGATIVE mg/dL
Hgb urine dipstick: NEGATIVE
Ketones, ur: NEGATIVE mg/dL
Protein, ur: NEGATIVE mg/dL
Specific Gravity, Urine: 1.01 (ref 1.005–1.030)
Urobilinogen, UA: 0.2 mg/dL (ref 0.0–1.0)

## 2010-04-07 ENCOUNTER — Other Ambulatory Visit: Payer: Medicaid Other

## 2010-04-07 DIAGNOSIS — O139 Gestational [pregnancy-induced] hypertension without significant proteinuria, unspecified trimester: Secondary | ICD-10-CM

## 2010-04-10 ENCOUNTER — Other Ambulatory Visit: Payer: Self-pay | Admitting: Obstetrics and Gynecology

## 2010-04-10 DIAGNOSIS — Z331 Pregnant state, incidental: Secondary | ICD-10-CM

## 2010-04-10 DIAGNOSIS — O139 Gestational [pregnancy-induced] hypertension without significant proteinuria, unspecified trimester: Secondary | ICD-10-CM

## 2010-04-10 LAB — POCT URINALYSIS DIP (DEVICE)
Ketones, ur: NEGATIVE mg/dL
Nitrite: NEGATIVE
Protein, ur: NEGATIVE mg/dL
Urobilinogen, UA: 0.2 mg/dL (ref 0.0–1.0)
pH: 6 (ref 5.0–8.0)

## 2010-04-14 ENCOUNTER — Other Ambulatory Visit: Payer: Medicaid Other

## 2010-04-14 DIAGNOSIS — O139 Gestational [pregnancy-induced] hypertension without significant proteinuria, unspecified trimester: Secondary | ICD-10-CM

## 2010-04-15 ENCOUNTER — Inpatient Hospital Stay (HOSPITAL_COMMUNITY)
Admission: RE | Admit: 2010-04-15 | Discharge: 2010-04-18 | DRG: 775 | Disposition: A | Discharge status: Home / Self Care | Payer: Medicaid Other | Source: Ambulatory Visit | Attending: Obstetrics & Gynecology | Admitting: Obstetrics & Gynecology

## 2010-04-15 DIAGNOSIS — O139 Gestational [pregnancy-induced] hypertension without significant proteinuria, unspecified trimester: Principal | ICD-10-CM | POA: Diagnosis present

## 2010-04-15 LAB — CBC
HCT: 35.6 % — ABNORMAL LOW (ref 36.0–46.0)
WBC: 8.3 10*3/uL (ref 4.0–10.5)

## 2010-04-15 LAB — RPR: RPR Ser Ql: NONREACTIVE

## 2010-04-16 DIAGNOSIS — O139 Gestational [pregnancy-induced] hypertension without significant proteinuria, unspecified trimester: Secondary | ICD-10-CM

## 2010-04-17 LAB — CBC
HCT: 30.9 % — ABNORMAL LOW (ref 36.0–46.0)
Hemoglobin: 10.1 g/dL — ABNORMAL LOW (ref 12.0–15.0)
MCH: 31.2 pg (ref 26.0–34.0)
MCHC: 32.7 g/dL (ref 30.0–36.0)

## 2010-04-22 ENCOUNTER — Ambulatory Visit (INDEPENDENT_AMBULATORY_CARE_PROVIDER_SITE_OTHER): Payer: Medicaid Other | Admitting: Family Medicine

## 2010-04-22 ENCOUNTER — Encounter: Payer: Self-pay | Admitting: Family Medicine

## 2010-04-22 ENCOUNTER — Telehealth: Payer: Self-pay | Admitting: *Deleted

## 2010-04-22 DIAGNOSIS — I10 Essential (primary) hypertension: Secondary | ICD-10-CM | POA: Insufficient documentation

## 2010-04-22 DIAGNOSIS — Z309 Encounter for contraceptive management, unspecified: Secondary | ICD-10-CM

## 2010-04-22 LAB — POCT UA - MICROSCOPIC ONLY

## 2010-04-22 LAB — CBC
MCV: 93.8 fL (ref 78.0–100.0)
Platelets: 305 10*3/uL (ref 150–400)
RBC: 3.7 MIL/uL — ABNORMAL LOW (ref 3.87–5.11)
WBC: 7.6 10*3/uL (ref 4.0–10.5)

## 2010-04-22 LAB — COMPREHENSIVE METABOLIC PANEL
Albumin: 3.5 g/dL (ref 3.5–5.2)
CO2: 25 mEq/L (ref 19–32)
Calcium: 10.8 mg/dL — ABNORMAL HIGH (ref 8.4–10.5)
Chloride: 104 mEq/L (ref 96–112)
Glucose, Bld: 93 mg/dL (ref 70–99)
Sodium: 140 mEq/L (ref 135–145)
Total Bilirubin: 0.4 mg/dL (ref 0.3–1.2)
Total Protein: 6.9 g/dL (ref 6.0–8.3)

## 2010-04-22 LAB — POCT URINALYSIS DIPSTICK
Ketones, UA: NEGATIVE
Protein, UA: NEGATIVE
Spec Grav, UA: 1.015

## 2010-04-22 NOTE — Progress Notes (Signed)
  Subjective:    Patient ID: Carol Blair, female    DOB: 1990/01/25, 20 y.o.   MRN: [id number]  HPI 1. HTN. Patient in clinic today after [facility] RN recommended evaluation of BP 156/102. Recently gave birth on 4/11 by SVD, discharged on 04/18/10. BP elevated since early 2nd trimester. Early pregnancy values 130s/80s. Antepartum and peripartum values 140s-150s/80-100. Patient was followed in high-risk clinic during pregnancy and monitored without antihypertensive agents necessary. Post-partum BP ranged from 137-154/83-104. Was given prescription for HCTZ 25 daily and only started this medication today after [facility] RN checked her BP. Not taking medications for pain. Denies visual changes, HA, swelling, SOB, abdominal pain, dizzyness, recent change in salt intake. No problems with her baby girl.    Review of Systems See HPI.    Objective:   Physical Exam  Vitals reviewed. Constitutional: She is oriented to person, place, and time. She appears well-developed and well-nourished. No distress.  HENT:  Head: Normocephalic and atraumatic.  Eyes: Conjunctivae and EOM are normal. Pupils are equal, round, and reactive to light.  Cardiovascular: Normal rate and regular rhythm.  Exam reveals no gallop.   No murmur heard. Pulmonary/Chest: Effort normal and breath sounds normal. No respiratory distress. She has no wheezes. She has no rales. She exhibits no tenderness.  Abdominal: Soft.  Musculoskeletal: She exhibits no edema and no tenderness.       Trace bilateral LE edema. 2+ DP pulses bilaterally.   Neurological: She is alert and oriented to person, place, and time. She exhibits normal muscle tone. Coordination normal.  Skin: She is not diaphoretic.          Assessment & Plan:

## 2010-04-22 NOTE — Patient Instructions (Signed)
Start taking HCTZ in the morning. Normal blood pressure in <140/90.  Please make nursing appointment for BP check on Thursday or Friday this week. Call clinic sooner if you have vision changes, headaches, swelling, shortness of breath, or stomach pains. Don't start birth control until 05/19/2010. See you on May 1!   Hypertension (High Blood Pressure) As your heart beats, it forces blood through your arteries. This force is your blood pressure. If the pressure is too high, it is called hypertension (HTN) or high blood pressure. HTN is dangerous because you may have it and not know it. High blood pressure may mean that your heart has to work harder to pump blood. Your arteries may be narrow or stiff. The extra work puts you at risk for heart disease, stroke, and other problems.  Blood pressure consists of two numbers, a higher number over a lower, 110/72, for example. It is stated as "110 over 72." The ideal is below 120 for the top number (systolic) and under 80 for the bottom (diastolic). Write down your blood pressure today. You should pay close attention to your blood pressure if you have certain conditions such as:  Heart failure.  Prior heart attack.   Diabetes   Chronic kidney disease.   Prior stroke.   Multiple risk factors for heart disease.   To see if you have HTN, your blood pressure should be measured while you are seated with your arm held at the level of the heart. It should be measured at least twice. A one-time elevated blood pressure reading (especially in the Emergency Department) does not mean that you need treatment. There may be conditions in which the blood pressure is different between your right and left arms. It is important to see your caregiver soon for a recheck. Most people have essential hypertension which means that there is not a specific cause. This type of high blood pressure may be lowered by changing lifestyle factors such as:  Stress.  Smoking.   Lack  of exercise.   Excessive weight.  Drug/tobacco/alcohol use.   Eating less salt.   Most people do not have symptoms from high blood pressure until it has caused damage to the body. Effective treatment can often prevent, delay or reduce that damage. TREATMENT Treatment for high blood pressure, when a cause has been identified, is directed at the cause. There are a large number of medications to treat HTN. These fall into several categories, and your caregiver will help you select the medicines that are best for you. Medications may have side effects. You should review side effects with your caregiver. If your blood pressure stays high after you have made lifestyle changes or started on medicines,   Your medication(s) may need to be changed.   Other problems may need to be addressed.   Be certain you understand your prescriptions, and know how and when to take your medicine.   Be sure to follow up with your caregiver within the time frame advised (usually within two weeks) to have your blood pressure rechecked and to review your medications.   If you are taking more than one medicine to lower your blood pressure, make sure you know how and at what times they should be taken. Taking two medicines at the same time can result in blood pressure that is too low.  SEEK IMMEDIATE MEDICAL CARE IF YOU DEVELOP:  A severe headache, blurred or changing vision, or confusion.   Unusual weakness or numbness, or a faint feeling.  Severe chest or abdominal pain, vomiting, or breathing problems.  MAKE SURE YOU:   Understand these instructions.   Will watch your condition.   Will get help right away if you are not doing well or get worse.  Document Released: 12/22/2004 Document Re-Released: 06/11/2009 [facility] Patient Information 2011 [place], [place].

## 2010-04-22 NOTE — Telephone Encounter (Signed)
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 , nurse  stating  BP reading today was 156/102. Patient was given HCTZ at hospital discharge after birth of baby but she has not started. Was instructed today to start . Patient denies headache, or visual changes. Does have some edema. Appointment is scheduled today for appointment with Dr. Cristal Ford.

## 2010-04-22 NOTE — Assessment & Plan Note (Addendum)
Goal is <140/90. No symptoms of pre-eclampsia currently. Will check UA, CBC and CMET to rule out. Likely has some pre-existing hypertension and has family history of sister having pre-eclampsia and subsequent chronic HTN. Advised to start HCTZ daily and f/u for BP check this week to evaluate therapeutic effect of medication. Routine post-partum visit on 05/06/10 scheduled. Return to care as stated or emergency care if swelling, HA, visual changes, SOB or any other concerning symptoms. PATP.

## 2010-04-24 ENCOUNTER — Ambulatory Visit (INDEPENDENT_AMBULATORY_CARE_PROVIDER_SITE_OTHER): Payer: Medicaid Other | Admitting: *Deleted

## 2010-04-24 VITALS — BP 142/88 | HR 68

## 2010-04-24 DIAGNOSIS — I10 Essential (primary) hypertension: Secondary | ICD-10-CM

## 2010-04-24 NOTE — Progress Notes (Signed)
In for BP check . She  Started HCTZ 04/22/2010. BP checked manually  using large adult cuff.  BP RA 146/88 and LA 142/88 pulse 68. Dr. Deirdre Priest notified.  advised for patient to check BP outside of office and bring log to next appointment. Has appointment scheduled 05/06/2010 .

## 2010-05-06 ENCOUNTER — Encounter: Payer: Self-pay | Admitting: Family Medicine

## 2010-05-06 ENCOUNTER — Ambulatory Visit (INDEPENDENT_AMBULATORY_CARE_PROVIDER_SITE_OTHER): Payer: Medicaid Other | Admitting: Family Medicine

## 2010-05-06 VITALS — BP 142/100 | HR 95 | Temp 98.1°F | Ht 65.0 in | Wt 234.6 lb

## 2010-05-06 DIAGNOSIS — I10 Essential (primary) hypertension: Secondary | ICD-10-CM

## 2010-05-06 DIAGNOSIS — Z309 Encounter for contraceptive management, unspecified: Secondary | ICD-10-CM

## 2010-05-06 LAB — POCT URINALYSIS DIPSTICK
Blood, UA: NEGATIVE
Ketones, UA: NEGATIVE
Protein, UA: NEGATIVE
Spec Grav, UA: 1.015
Urobilinogen, UA: 1

## 2010-05-06 LAB — COMPREHENSIVE METABOLIC PANEL
AST: 15 U/L (ref 0–37)
Albumin: 4.2 g/dL (ref 3.5–5.2)
Alkaline Phosphatase: 124 U/L — ABNORMAL HIGH (ref 39–117)
Potassium: 3.3 mEq/L — ABNORMAL LOW (ref 3.5–5.3)
Sodium: 138 mEq/L (ref 135–145)
Total Bilirubin: 0.5 mg/dL (ref 0.3–1.2)
Total Protein: 8.5 g/dL — ABNORMAL HIGH (ref 6.0–8.3)

## 2010-05-06 LAB — CBC
MCH: 30.1 pg (ref 26.0–34.0)
MCHC: 32 g/dL (ref 30.0–36.0)
Platelets: 378 10*3/uL (ref 150–400)
RDW: 13.1 % (ref 11.5–15.5)

## 2010-05-06 MED ORDER — AMLODIPINE BESYLATE 10 MG PO TABS: 10.0000 mg | ORAL_TABLET | Freq: Every day | ORAL | Status: DC

## 2010-05-06 NOTE — Progress Notes (Signed)
  Subjective:    Patient ID: Carol Blair, female    DOB: [birth year], 20 y.o.   MRN: [id number]  HPI 1. HTN. Gestational/?chronic HTN diagnosed during recent pregnancy. Did return for BP check recently. Was improved 146/82 after starting HCTZ. Has been taking medicine regularly, but not checking BP at home. Felt stomach cramps/spasm yesterday and took motrin but threw it up. Denies pain, SOB, dizzyness, HA, visual changes, increased urination. Has not started OCPs since postpartum discharge on 4/13 (gave birth on 4/11).    Review of Systems See HPI    Objective:   Physical Exam  Vitals reviewed. Constitutional: She is oriented to person, place, and time. She appears well-developed and well-nourished. No distress.  HENT:  Head: Normocephalic and atraumatic.  Eyes: EOM are normal. Pupils are equal, round, and reactive to light.  Cardiovascular: Normal rate, regular rhythm and normal heart sounds.   No murmur heard. Pulmonary/Chest: Effort normal and breath sounds normal. No respiratory distress. She has no wheezes. She has no rales.  Abdominal: Soft. She exhibits no distension. There is no tenderness.  Musculoskeletal: She exhibits no edema.  Neurological: She is alert and oriented to person, place, and time. No cranial nerve deficit.  Skin: She is not diaphoretic.  Psychiatric: She has a normal mood and affect.          Assessment & Plan:

## 2010-05-06 NOTE — Assessment & Plan Note (Signed)
Will check pre-eclampsia labs once more today (negative 4/17) to evaluate for late-onset pre-eclamptic syndrome or proteinuria. Plan to obtain 24-hr urine if protein is present. Will also start norvasc in addition to HCTZ. Follow up for BP check in one week. Goal <140/90. Will avoid OCPs due to increased risk of VTE with uncontrolled HTN currently.

## 2010-05-06 NOTE — Assessment & Plan Note (Signed)
Will avoid estrogen-containing agents. Discussed depo vs IUD. Patient not currently sexually active or interested in pursuiting these options. Will follow up as needed.

## 2010-05-06 NOTE — Patient Instructions (Signed)
Please start taking norvasc daily with your HCTZ for blood pressure. Schedule a blood pressure check in next week or call office with measurements from home.  Do not start your birth control pills. We should avoid estrogen in birth control with your high blood pressure. Can discuss depo or Mirena IUD as good options with fewer side effects. Please call office or go to [facility] hospital if you have headache, vision changes, swelling, or shortness of breath.

## 2010-05-07 ENCOUNTER — Emergency Department (HOSPITAL_COMMUNITY)
Admission: EM | Admit: 2010-05-07 | Discharge: 2010-05-07 | Disposition: A | Discharge status: Home / Self Care | Payer: Medicaid Other | Attending: Emergency Medicine | Admitting: Emergency Medicine

## 2010-05-07 ENCOUNTER — Emergency Department (HOSPITAL_COMMUNITY): Payer: Medicaid Other

## 2010-05-07 DIAGNOSIS — R112 Nausea with vomiting, unspecified: Secondary | ICD-10-CM | POA: Insufficient documentation

## 2010-05-07 DIAGNOSIS — Z79899 Other long term (current) drug therapy: Secondary | ICD-10-CM | POA: Insufficient documentation

## 2010-05-07 DIAGNOSIS — R1013 Epigastric pain: Secondary | ICD-10-CM | POA: Insufficient documentation

## 2010-05-07 DIAGNOSIS — I10 Essential (primary) hypertension: Secondary | ICD-10-CM | POA: Insufficient documentation

## 2010-05-07 DIAGNOSIS — K5900 Constipation, unspecified: Secondary | ICD-10-CM | POA: Insufficient documentation

## 2010-05-07 DIAGNOSIS — K8020 Calculus of gallbladder without cholecystitis without obstruction: Secondary | ICD-10-CM | POA: Insufficient documentation

## 2010-05-07 LAB — DIFFERENTIAL
Basophils Absolute: 0 10*3/uL (ref 0.0–0.1)
Eosinophils Relative: 0 % (ref 0–5)
Lymphocytes Relative: 29 % (ref 12–46)
Lymphs Abs: 2.2 10*3/uL (ref 0.7–4.0)
Monocytes Absolute: 0.5 10*3/uL (ref 0.1–1.0)
Monocytes Relative: 7 % (ref 3–12)
Neutro Abs: 4.8 10*3/uL (ref 1.7–7.7)

## 2010-05-07 LAB — URINALYSIS, ROUTINE W REFLEX MICROSCOPIC
Nitrite: NEGATIVE
Protein, ur: NEGATIVE mg/dL
Specific Gravity, Urine: 1.022 (ref 1.005–1.030)
Urobilinogen, UA: >8 mg/dL — ABNORMAL HIGH (ref 0.0–1.0)

## 2010-05-07 LAB — BASIC METABOLIC PANEL
CO2: 27 mEq/L (ref 19–32)
Calcium: 10.5 mg/dL (ref 8.4–10.5)
Creatinine, Ser: 0.79 mg/dL (ref 0.4–1.2)
GFR calc Af Amer: >60 mL/min (ref >60)
GFR calc non Af Amer: >60 mL/min (ref >60)
Sodium: 138 mEq/L (ref 135–145)

## 2010-05-07 LAB — CBC
HCT: 37.8 % (ref 36.0–46.0)
Hemoglobin: 12.7 g/dL (ref 12.0–15.0)
MCH: 30.8 pg (ref 26.0–34.0)
MCHC: 33.6 g/dL (ref 30.0–36.0)
MCV: 91.7 fL (ref 78.0–100.0)
RDW: 12.6 % (ref 11.5–15.5)

## 2010-05-07 LAB — HEPATIC FUNCTION PANEL
Alkaline Phosphatase: 145 U/L — ABNORMAL HIGH (ref 39–117)
Bilirubin, Direct: 0.4 mg/dL — ABNORMAL HIGH (ref 0.0–0.3)
Indirect Bilirubin: 0.5 mg/dL (ref 0.3–0.9)
Total Bilirubin: 0.9 mg/dL (ref 0.3–1.2)

## 2010-05-07 LAB — POCT PREGNANCY, URINE: Preg Test, Ur: NEGATIVE

## 2010-05-07 LAB — URINE MICROSCOPIC-ADD ON

## 2010-05-14 ENCOUNTER — Ambulatory Visit: Payer: Medicaid Other | Admitting: Obstetrics and Gynecology

## 2010-05-14 ENCOUNTER — Ambulatory Visit: Payer: Medicaid Other | Admitting: Obstetrics & Gynecology

## 2010-05-23 ENCOUNTER — Ambulatory Visit (INDEPENDENT_AMBULATORY_CARE_PROVIDER_SITE_OTHER): Payer: Medicaid Other | Admitting: Family Medicine

## 2010-05-23 ENCOUNTER — Telehealth: Payer: Self-pay | Admitting: Family Medicine

## 2010-05-23 ENCOUNTER — Encounter: Payer: Self-pay | Admitting: Family Medicine

## 2010-05-23 VITALS — BP 125/77 | HR 75 | Temp 97.9°F | Ht 65.0 in | Wt 235.2 lb

## 2010-05-23 DIAGNOSIS — I10 Essential (primary) hypertension: Secondary | ICD-10-CM

## 2010-05-23 DIAGNOSIS — K8020 Calculus of gallbladder without cholecystitis without obstruction: Secondary | ICD-10-CM | POA: Insufficient documentation

## 2010-05-23 NOTE — Assessment & Plan Note (Signed)
Pt's blood pressure is improved. Pt is taking BP pills and doing well with them.

## 2010-05-23 NOTE — Assessment & Plan Note (Signed)
Pt told that she had gallstones in the ED 05-07-10. She had labs that were abnormal at the time including elelvated liver enzymes and a low potassium. Plan to repeat those labs today.

## 2010-05-23 NOTE — Progress Notes (Signed)
Post-partum; Pt was not seen for pregnancy care other than the NOB visit. She was seen at the [facility] clinic the rest of the pregnancy. She was suppose to follow up with them for post partum care but for multiple reasons has not made it there. She comes in today with a h/o induction at 40.0 weeks, vaginal delivery, spotting for 2 weeks after the delivery and then a normal menstruation that started yesterday. She is not having sex now and is not taking her birth control pills. She says she has been abstinent since 2 months into the pregnancy. She has no discharge, she is having abdominal pain (see below), she is bottle feeding, sleeping 5 hours a night, feels happy (no depression), she starts back to work on Monday and is noticing some constipation (see below).  Abd pain: Pt is having some RUQ abdominal pain. She went to the ED on 05-07-10 and was told that she has gallstones. She was told to have surgery in 2-3 weeks but says that she can not get the surgery until some time in July due to her pregnancy medicaid not paying for it and having to wait until regular pregnancy kicks in. She was given Vicodin which she says helped some but is not taking care of all the pain. She is having some constipation with the medicine and is taking colace and bisacodyl for it.   HTN: Pt was diagnosed with HTN during the pregnancy although we suspect she had it before. When she first came to [place] in Dec she was already pregnant and hypertensive. She does not remember being told she was hypertensive before the pregnancy but since it has not resolved post-partum it was likely present before.   ROS: neg except as noted in HPI.   PE: Gen: holding and feeding baby.  GU: normal introitus, no erythema, no tears, no bleeding, uterus and adnexa are normal size.

## 2010-05-23 NOTE — Telephone Encounter (Signed)
Is asking to speak with Dr Clotilde Dieter about meds for her gall stones before she has surgery.  Wants to know if anything can be called in to [facility] [address] [facility]

## 2010-05-23 NOTE — Patient Instructions (Addendum)
Your blood pressure is better today. We are checking your blood today because your liver enzymes and your potassium levels were at of balance on 05-07-10.  Keep up the good work with the baby.

## 2010-05-24 LAB — COMPREHENSIVE METABOLIC PANEL
AST: 44 U/L — ABNORMAL HIGH (ref 0–37)
Albumin: 4.2 g/dL (ref 3.5–5.2)
Alkaline Phosphatase: 146 U/L — ABNORMAL HIGH (ref 39–117)
Glucose, Bld: 97 mg/dL (ref 70–99)
Potassium: 3.8 mEq/L (ref 3.5–5.3)
Sodium: 138 mEq/L (ref 135–145)
Total Bilirubin: 0.6 mg/dL (ref 0.3–1.2)
Total Protein: 7.7 g/dL (ref 6.0–8.3)

## 2010-05-26 ENCOUNTER — Encounter: Payer: Self-pay | Admitting: Family Medicine

## 2010-05-26 NOTE — Telephone Encounter (Signed)
Called and left a message.  Advised pt to stay away from Tylenol as her liver enzymes are already elevated.  Also, advised the patient to use motrin or aleve for the pain. Advised that she say away from spicy and oily/fatty foods.  She can call me or her PCP Dr. Cristal Ford back if she has any further questions.

## 2010-05-29 ENCOUNTER — Ambulatory Visit: Payer: Medicaid Other | Admitting: Family Medicine

## 2010-06-01 ENCOUNTER — Emergency Department (HOSPITAL_COMMUNITY)
Admission: EM | Admit: 2010-06-01 | Discharge: 2010-06-01 | Disposition: A | Discharge status: Home / Self Care | Payer: Medicaid Other | Attending: Emergency Medicine | Admitting: Emergency Medicine

## 2010-06-01 DIAGNOSIS — R1011 Right upper quadrant pain: Secondary | ICD-10-CM | POA: Insufficient documentation

## 2010-06-01 DIAGNOSIS — E669 Obesity, unspecified: Secondary | ICD-10-CM | POA: Insufficient documentation

## 2010-06-01 DIAGNOSIS — M549 Dorsalgia, unspecified: Secondary | ICD-10-CM | POA: Insufficient documentation

## 2010-06-01 DIAGNOSIS — K8020 Calculus of gallbladder without cholecystitis without obstruction: Secondary | ICD-10-CM | POA: Insufficient documentation

## 2010-06-01 DIAGNOSIS — I10 Essential (primary) hypertension: Secondary | ICD-10-CM | POA: Insufficient documentation

## 2010-06-01 LAB — URINE MICROSCOPIC-ADD ON

## 2010-06-01 LAB — DIFFERENTIAL
Basophils Absolute: 0 10*3/uL (ref 0.0–0.1)
Eosinophils Relative: 0 % (ref 0–5)
Lymphocytes Relative: 24 % (ref 12–46)
Monocytes Absolute: 0.4 10*3/uL (ref 0.1–1.0)
Monocytes Relative: 5 % (ref 3–12)
Neutro Abs: 5.4 10*3/uL (ref 1.7–7.7)

## 2010-06-01 LAB — URINALYSIS, ROUTINE W REFLEX MICROSCOPIC
Hgb urine dipstick: NEGATIVE
Nitrite: NEGATIVE
Protein, ur: NEGATIVE mg/dL
Specific Gravity, Urine: 1.02 (ref 1.005–1.030)
Urobilinogen, UA: 4 mg/dL — ABNORMAL HIGH (ref 0.0–1.0)

## 2010-06-01 LAB — CBC
HCT: 38.3 % (ref 36.0–46.0)
Hemoglobin: 13 g/dL (ref 12.0–15.0)
MCH: 30.4 pg (ref 26.0–34.0)
MCHC: 33.9 g/dL (ref 30.0–36.0)
RDW: 12.6 % (ref 11.5–15.5)

## 2010-06-01 LAB — COMPREHENSIVE METABOLIC PANEL
BUN: 7 mg/dL (ref 6–23)
CO2: 29 mEq/L (ref 19–32)
Calcium: 10.1 mg/dL (ref 8.4–10.5)
Creatinine, Ser: 0.67 mg/dL (ref 0.4–1.2)
GFR calc non Af Amer: >60 mL/min (ref >60)
Glucose, Bld: 99 mg/dL (ref 70–99)
Total Protein: 8.3 g/dL (ref 6.0–8.3)

## 2010-06-01 LAB — LIPASE, BLOOD: Lipase: 23 U/L (ref 11–59)

## 2010-06-01 LAB — POCT PREGNANCY, URINE: Preg Test, Ur: NEGATIVE

## 2010-06-04 ENCOUNTER — Ambulatory Visit: Payer: Medicaid Other | Admitting: Obstetrics and Gynecology

## 2010-06-04 ENCOUNTER — Encounter (INDEPENDENT_AMBULATORY_CARE_PROVIDER_SITE_OTHER): Payer: Self-pay | Admitting: General Surgery

## 2010-06-05 ENCOUNTER — Other Ambulatory Visit: Payer: Self-pay | Admitting: General Surgery

## 2010-06-05 ENCOUNTER — Encounter (HOSPITAL_COMMUNITY): Payer: Medicaid Other

## 2010-06-05 ENCOUNTER — Ambulatory Visit (HOSPITAL_COMMUNITY)
Admission: RE | Admit: 2010-06-05 | Discharge: 2010-06-05 | Disposition: A | Discharge status: Home / Self Care | Payer: Medicaid Other | Source: Ambulatory Visit | Attending: General Surgery | Admitting: General Surgery

## 2010-06-05 DIAGNOSIS — Z01811 Encounter for preprocedural respiratory examination: Secondary | ICD-10-CM | POA: Insufficient documentation

## 2010-06-05 DIAGNOSIS — I10 Essential (primary) hypertension: Secondary | ICD-10-CM | POA: Insufficient documentation

## 2010-06-05 DIAGNOSIS — K8020 Calculus of gallbladder without cholecystitis without obstruction: Secondary | ICD-10-CM

## 2010-06-05 DIAGNOSIS — I498 Other specified cardiac arrhythmias: Secondary | ICD-10-CM | POA: Insufficient documentation

## 2010-06-05 DIAGNOSIS — Z01818 Encounter for other preprocedural examination: Secondary | ICD-10-CM | POA: Insufficient documentation

## 2010-06-05 DIAGNOSIS — Z01810 Encounter for preprocedural cardiovascular examination: Secondary | ICD-10-CM | POA: Insufficient documentation

## 2010-06-05 DIAGNOSIS — Z01812 Encounter for preprocedural laboratory examination: Secondary | ICD-10-CM | POA: Insufficient documentation

## 2010-06-05 LAB — COMPREHENSIVE METABOLIC PANEL
Albumin: 3.7 g/dL (ref 3.5–5.2)
Alkaline Phosphatase: 132 U/L — ABNORMAL HIGH (ref 39–117)
BUN: 6 mg/dL (ref 6–23)
CO2: 30 mEq/L (ref 19–32)
Chloride: 99 mEq/L (ref 96–112)
GFR calc non Af Amer: >60 mL/min (ref >60)
Potassium: 3.4 mEq/L — ABNORMAL LOW (ref 3.5–5.1)
Total Bilirubin: 0.4 mg/dL (ref 0.3–1.2)

## 2010-06-05 LAB — SURGICAL PCR SCREEN: MRSA, PCR: NEGATIVE

## 2010-06-10 ENCOUNTER — Other Ambulatory Visit: Payer: Self-pay | Admitting: General Surgery

## 2010-06-10 ENCOUNTER — Ambulatory Visit (HOSPITAL_COMMUNITY): Payer: Medicaid Other

## 2010-06-10 ENCOUNTER — Ambulatory Visit (HOSPITAL_COMMUNITY)
Admission: RE | Admit: 2010-06-10 | Discharge: 2010-06-10 | Disposition: A | Discharge status: Home / Self Care | Payer: Medicaid Other | Source: Ambulatory Visit | Attending: General Surgery | Admitting: General Surgery

## 2010-06-10 DIAGNOSIS — R1011 Right upper quadrant pain: Secondary | ICD-10-CM | POA: Insufficient documentation

## 2010-06-10 DIAGNOSIS — K8020 Calculus of gallbladder without cholecystitis without obstruction: Secondary | ICD-10-CM | POA: Insufficient documentation

## 2010-06-10 DIAGNOSIS — E669 Obesity, unspecified: Secondary | ICD-10-CM | POA: Insufficient documentation

## 2010-06-10 DIAGNOSIS — I10 Essential (primary) hypertension: Secondary | ICD-10-CM | POA: Insufficient documentation

## 2010-06-10 HISTORY — PX: LAPAROSCOPIC CHOLECYSTECTOMY W/ CHOLANGIOGRAPHY: SUR757

## 2010-07-03 ENCOUNTER — Ambulatory Visit: Payer: Medicaid Other | Admitting: Family Medicine

## 2010-07-07 ENCOUNTER — Encounter (INDEPENDENT_AMBULATORY_CARE_PROVIDER_SITE_OTHER): Payer: Self-pay | Admitting: General Surgery

## 2010-07-10 ENCOUNTER — Encounter (INDEPENDENT_AMBULATORY_CARE_PROVIDER_SITE_OTHER): Payer: Self-pay | Admitting: General Surgery

## 2010-07-10 ENCOUNTER — Ambulatory Visit (INDEPENDENT_AMBULATORY_CARE_PROVIDER_SITE_OTHER): Payer: Medicaid Other | Admitting: General Surgery

## 2010-07-10 DIAGNOSIS — K8010 Calculus of gallbladder with chronic cholecystitis without obstruction: Secondary | ICD-10-CM

## 2010-07-10 DIAGNOSIS — E669 Obesity, unspecified: Secondary | ICD-10-CM

## 2010-07-10 DIAGNOSIS — I10 Essential (primary) hypertension: Secondary | ICD-10-CM

## 2010-07-10 NOTE — Patient Instructions (Addendum)
Can resume normal activities.  Avoid fatty, greasy foods.

## 2010-07-10 NOTE — Op Note (Signed)
NAMEZONNIQUE, NORKUS               ACCOUNT NO.:  [id number]  MEDICAL RECORD NO.:  [id number]  LOCATION:  DAYL                         FACILITY:  [facility]  PHYSICIAN:  Mary Sella. Andrey Campanile, MD     DATE OF BIRTH:  07-07-1990  DATE OF PROCEDURE:  06/10/2010 DATE OF DISCHARGE:  06/10/2010                              OPERATIVE REPORT   PREOPERATIVE DIAGNOSIS:  Symptomatic cholelithiasis.  POSTOPERATIVE DIAGNOSIS:  Symptomatic cholelithiasis.  PROCEDURE:  Laparoscopic cholecystectomy with intraoperative cholangiogram.  SURGEON:  Mary Sella. Andrey Campanile, MD  ASSISTANT SURGEON:  None.  ANESTHESIA:  General plus 30 cc of Exparel.  SPECIMEN:  Gallbladder.  ESTIMATED BLOOD LOSS:  Minimal.  FINDINGS:  Critical view was achieved.  The cholangiogram demonstrated prompt filling of the cystic duct, common hepatic, left and right hepatic ducts, and common bile duct and emptying into the duodenum. There was no evidence of filling defect.  INDICATIONS FOR PROCEDURE:  The patient is an obese 20 year old [place] American female who recently delivered a child about 21 weeks ago.  She has been having intermittent right upper quadrant pain.  This actually started during the pregnancy, but has worsened now that she has delivered.  The pain will start in the right upper quadrant and radiate to both sidesof her abdomen.  She will occasionally have nausea and vomiting.  It generally occurs postprandial.  She went to the ER where an ultrasound was done, which demonstrated multiple small gallstones. We discussed the risks and benefits of surgery including bleeding, infection, injury to surrounding structures, bile leak, prolonged diarrhea, DVT occurrence, injury to the common bile duct requiring major reconstructive bile duct surgery, need to convert to an open procedure, as well as failure to abdominal pain.  She elected to proceed to surgery.  DESCRIPTION OF PROCEDURE:  After obtaining informed consent, the  patient was brought to the operating room and placed supine on the operating table.  General endotracheal anesthesia was established.  A surgical time-out was performed.  Sequential compression devices were placed. Her abdomen was prepped and draped in the usual standard surgical fashion.  She received 2 g of cefoxitin prior to skin incision.  A surgical time-out was performed.  A 1 inch vertical infraumbilical incision was made with a #11 blade.  The fascia was grasped with Kocher and lifted anteriorly.  Next, the fascia was incised with a #11 blade and the abdominal cavity was entered.  Next, a pursestring suture consisting of 0-Vicryl on a UR-6 needle was placed from the fascial edges.  A Hasson trocar was directly placed into the abdominal cavity and secured with a pursestring suture.  Pneumoperitoneum was smoothly established up to a patient pressure of 15 mmHg.  Laparoscope was advanced and the abdominal cavity was surveilled.  There were no overt signs of abnormal pathology.  There were no signs of injury to surrounding structures.  She was then placed in reverse Trendelenburg and rotated slightly to the left.  Three additional 5 mm trocars were placed under direct visualization, one in the subxiphoid and two in the right hypochondrium, all under direct visualization.  The gallbladder was grasped and then lifted toward the right shoulder.  The neck was  grasped and retracted laterally.  I then incised the peritoneum overlying the gallbladder both medially and laterally with hook electrocautery.  Using a [employer], I dissected out 2 structures entering the gallbladder, one was the cystic duct and one was the cystic artery, each were circumferentially dissected out around with a [place] along with the aid of the hook electrocautery. There were no other structures entering the gallbladder.  A clip was placed on the distal cystic duct as it entered the gallbladder.   A small nick was made in the skin and the cholangiogram catheter was percutaneously advanced through the abdominal wall into the abdominal cavity.  I then partially transected the cystic duct and threaded the cholangiogram catheter into the cystic duct and secured it with a clip. The cholangiogram was performed with the results as described above. The fluoro machine was then brought out.  We then reestablished pneumoperitoneum.  I removed the clip, securing the cholangiogram catheter.  Both the clip and the cholangiogram catheter were discarded from the abdominal cavity.  Four clips were placed on the downside of the cystic duct.  I then transected it with Endoshears.  The cystic artery actually had an anterior and a posterior branch and each branch was very diminutive, each were about 1000 mm.  I put 1 clip on the anterior, 1 clip on the posterior and then used hook electrocautery to ligate the cystic artery distally.  I then rolled the gallbladder up out of the gallbladder fossa using hook electrocautery.  The posterior wall of the gallbladder was somewhat thin.  The gallbladder was entered with spillage of some bile.  However, there was no spillage of stones.  The gallbladder was eventually freed from the gallbladder fossa.  The camera was placed in the subxiphoid trocar and the specimen bag was advanced through the umbilical trocar.  The gallbladder was placed within the specimen bag and then the bag containing the gallbladder was removed from the abdominal cavity.  Pneumoperitoneum was reestablished.  I then obtained hemostasis with hook electrocautery in the gallbladder fossa. I then irrigated the right upper quadrant with 1 L of saline.  There was no evidence of bile leak.  There was no evidence of bleeding.  There was no evidence of spilled stones.  I removed this on trocar and tied down the previously placed pursestring suture, thus obliterating the fascial defect.  This was  confirmed laparoscopically.  There was no evidence of air leak at umbilicus.  I then injected Exparel preperitoneally and in subcutaneous tissue at the umbilicus as well as at all trocar sites. Pneumoperitoneum was released and the remaining trocars were removed. All skin incisions were closed with 4-0 Monocryl in a subcuticular fashion followed by application of benzoin, Steri-Strips, and sterile bandages.  All needle, instruments, and sponge counts were x2.  There were no immediate complications.  The patient tolerated the procedure well.     Mary Sella. Andrey Campanile, MD     EMW/MEDQ  D:  06/10/2010  T:  06/11/2010  Job:  [id number]  cc:   Lloyd Huger, MD  Electronically Signed by Gaynelle Adu M.D. on 07/10/2010 12:09:08 PM

## 2010-07-10 NOTE — Progress Notes (Signed)
Procedure: Laparoscopic cholecystectomy with intraoperative cholangiogram on June 10, 2010  History of Present Ilness: Patient comes in today for her first postoperative visit after undergoing the above mentioned procedure. She did quite well after surgery. She only took 3 pain pills. She denies any fevers, chills, nausea, vomiting, constipation, or abdominal pain. She had one episode of loose stool last week but that was after eating fried food. Her appetite is normal. She has no questions or concerns today.  Physical Exam: Well-developed well-nourished overweight African American female in no apparent distress Lungs are clear Regular rate and rhythm Abdomen is soft, nontender, nondistended. Well-healed trocar incisions. No signs of umbilical hernia. No scleral icterus or jaundice.  Pathology: Chronic cholecystitis, cholelithiasis, and cholesterolosis  Assessment and Plan: Status post laparoscopic cholecystectomy with interoperative cholangiogram for chronic cholecystitis doing well. I reviewed her surgical pathology with her. Since she is doing well we will see her on an as-needed basis.

## 2010-08-29 ENCOUNTER — Other Ambulatory Visit: Payer: Self-pay | Admitting: Family Medicine

## 2010-08-29 NOTE — Telephone Encounter (Signed)
Refill request

## 2010-09-02 NOTE — Telephone Encounter (Signed)
rx was sent to pharmacy in April . Called and pharmacist states they only had 3 refills listed. He will put in for 6 refills now.

## 2011-01-06 NOTE — L&D Delivery Note (Signed)
Delivery Note At 12:56 AM a viable and healthy female was delivered via Vaginal, Spontaneous Delivery (Presentation: ; Right Occiput Anterior) with light meconium.  APGAR: 9, 9; weight 6 lb 1.4 oz (2761 g).   Placenta status: Intact, Spontaneous.  Cord: 3 vessels with the following complications: None.    Anesthesia: None  Episiotomy: None Lacerations: None Suture Repair: n/a Est. Blood Loss (mL): 200  Mom to postpartum.  Baby to nursery-stable.  Simone Curia 09/16/2011, 5:13 AM  I have seen and examined this patient and I agree with the above. OVELL, ADKINSON 8:12 AM 09/16/2011

## 2011-02-04 ENCOUNTER — Other Ambulatory Visit: Payer: Self-pay | Admitting: Family Medicine

## 2011-02-11 ENCOUNTER — Ambulatory Visit (INDEPENDENT_AMBULATORY_CARE_PROVIDER_SITE_OTHER): Payer: Medicaid Other | Admitting: Family Medicine

## 2011-02-11 ENCOUNTER — Encounter: Payer: Self-pay | Admitting: Family Medicine

## 2011-02-11 VITALS — BP 152/79 | HR 79 | Temp 97.9°F | Ht 65.0 in | Wt 251.6 lb

## 2011-02-11 DIAGNOSIS — Z3201 Encounter for pregnancy test, result positive: Secondary | ICD-10-CM

## 2011-02-11 DIAGNOSIS — I10 Essential (primary) hypertension: Secondary | ICD-10-CM

## 2011-02-11 DIAGNOSIS — O10019 Pre-existing essential hypertension complicating pregnancy, unspecified trimester: Secondary | ICD-10-CM

## 2011-02-11 DIAGNOSIS — N912 Amenorrhea, unspecified: Secondary | ICD-10-CM

## 2011-02-11 DIAGNOSIS — O0990 Supervision of high risk pregnancy, unspecified, unspecified trimester: Secondary | ICD-10-CM | POA: Insufficient documentation

## 2011-02-11 DIAGNOSIS — O10919 Unspecified pre-existing hypertension complicating pregnancy, unspecified trimester: Secondary | ICD-10-CM

## 2011-02-11 DIAGNOSIS — Z3490 Encounter for supervision of normal pregnancy, unspecified, unspecified trimester: Secondary | ICD-10-CM

## 2011-02-11 HISTORY — DX: Pre-existing essential hypertension complicating pregnancy, unspecified trimester: O10.019

## 2011-02-11 LAB — POCT URINE PREGNANCY: Preg Test, Ur: POSITIVE

## 2011-02-11 MED ORDER — TH PRENATAL VITAMINS 28-0.8 MG PO TABS: 1.0000 | ORAL_TABLET | Freq: Every day | ORAL | Status: DC

## 2011-02-11 NOTE — Assessment & Plan Note (Signed)
Off meds due to pregnancy. No urinary or lab evidence of secondary causes. Strong family history.

## 2011-02-11 NOTE — Assessment & Plan Note (Signed)
BP elevated to previous levels of 150/79.  Last pregnancy patient was followed in [facility] clinic and never required treatment or developed pre-eclampsia. Will obtain OB panel labs today. Sent rx for PNVs. Will refer to high risk clinic for [facility].

## 2011-02-11 NOTE — Progress Notes (Signed)
  Subjective:    Patient ID: Carol Blair, female    DOB: September 06, 1990, 21 y.o.   MRN: [id number]  HPI  1. Positive pregnancy test. Has positive test at home and today in office.  LMP 12/25/10. Did not desire pregnancy now but does wish to keep it. States she used a condom and got pregnant. Actually is smiling and seems happy today. Delivered first daughter in April 2012. Doing well, mother is very supportive. Current FOB is a long-time family friend and also supportive. Denies any n/v, edema, bleeding.  2. HTN. Has not been taking medications since her cholecystectomy. HTN has been present since prior to first pregnancy last year. Her sister also has HTN. Has been feeling very well recently and is surprised BP is elevated. Denies HA, edema, CP.   Review of Systems See HPI otherwise negative.     Objective:   Physical Exam  Vitals reviewed. Constitutional: She is oriented to person, place, and time. She appears well-developed and well-nourished. No distress.  HENT:  Head: Normocephalic and atraumatic.  Eyes: EOM are normal. Pupils are equal, round, and reactive to light.  Cardiovascular: Normal rate, regular rhythm, normal heart sounds and intact distal pulses.   No murmur heard. Pulmonary/Chest: Effort normal and breath sounds normal. No respiratory distress. She has no wheezes. She has no rales.  Musculoskeletal: She exhibits no edema and no tenderness.  Neurological: She is alert and oriented to person, place, and time. No cranial nerve deficit. She exhibits normal muscle tone. Coordination normal.  Skin: No rash noted.  Psychiatric: She has a normal mood and affect.       Assessment & Plan:
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
## 2011-02-12 LAB — OBSTETRIC PANEL
Basophils Relative: 0 % (ref 0–1)
Hemoglobin: 11.5 g/dL — ABNORMAL LOW (ref 12.0–15.0)
Hepatitis B Surface Ag: NEGATIVE
Lymphs Abs: 2.9 10*3/uL (ref 0.7–4.0)
MCHC: 32 g/dL (ref 30.0–36.0)
Monocytes Relative: 6 % (ref 3–12)
Neutro Abs: 3.7 10*3/uL (ref 1.7–7.7)
Neutrophils Relative %: 52 % (ref 43–77)
RBC: 3.99 MIL/uL (ref 3.87–5.11)
Rh Type: POSITIVE
WBC: 7.1 10*3/uL (ref 4.0–10.5)

## 2011-02-12 LAB — HIV ANTIBODY (ROUTINE TESTING W REFLEX): HIV: NONREACTIVE

## 2011-02-26 ENCOUNTER — Ambulatory Visit (INDEPENDENT_AMBULATORY_CARE_PROVIDER_SITE_OTHER): Payer: Medicaid Other | Admitting: Family Medicine

## 2011-02-26 ENCOUNTER — Other Ambulatory Visit (HOSPITAL_COMMUNITY)
Admission: RE | Admit: 2011-02-26 | Discharge: 2011-02-26 | Disposition: A | Discharge status: Home / Self Care | Payer: Medicaid Other | Source: Ambulatory Visit | Attending: Family Medicine | Admitting: Family Medicine

## 2011-02-26 ENCOUNTER — Encounter: Payer: Self-pay | Admitting: Family Medicine

## 2011-02-26 ENCOUNTER — Other Ambulatory Visit: Payer: Self-pay | Admitting: Family Medicine

## 2011-02-26 DIAGNOSIS — R87810 Cervical high risk human papillomavirus (HPV) DNA test positive: Secondary | ICD-10-CM | POA: Insufficient documentation

## 2011-02-26 DIAGNOSIS — Z3682 Encounter for antenatal screening for nuchal translucency: Secondary | ICD-10-CM

## 2011-02-26 DIAGNOSIS — Z01419 Encounter for gynecological examination (general) (routine) without abnormal findings: Secondary | ICD-10-CM | POA: Insufficient documentation

## 2011-02-26 DIAGNOSIS — Z113 Encounter for screening for infections with a predominantly sexual mode of transmission: Secondary | ICD-10-CM | POA: Insufficient documentation

## 2011-02-26 DIAGNOSIS — I10 Essential (primary) hypertension: Secondary | ICD-10-CM

## 2011-02-26 DIAGNOSIS — O10019 Pre-existing essential hypertension complicating pregnancy, unspecified trimester: Secondary | ICD-10-CM

## 2011-02-26 DIAGNOSIS — Z23 Encounter for immunization: Secondary | ICD-10-CM

## 2011-02-26 LAB — OB RESULTS CONSOLE GC/CHLAMYDIA: Gonorrhea: NEGATIVE

## 2011-02-26 LAB — POCT URINALYSIS DIP (DEVICE)
Bilirubin Urine: NEGATIVE
pH: 6.5 (ref 5.0–8.0)

## 2011-02-26 MED ORDER — INFLUENZA VIRUS VACC SPLIT PF IM SUSP
0.5000 mL | INTRAMUSCULAR | Status: AC
  Administered 2011-02-26: 0.5 mL via INTRAMUSCULAR

## 2011-02-26 NOTE — Progress Notes (Signed)
First Trimester Screen March 20,2013 at 10 am.

## 2011-02-26 NOTE — Progress Notes (Signed)
   Subjective:    Carol Blair is a G2P1001 [redacted]w[redacted]d being seen today for her first obstetrical visit.  Her obstetrical history is significant for Hypertension. Patient does not intend to breast feed. Pregnancy history fully reviewed.  Patient reports no complaints.  Filed Vitals:   02/26/11 0810 02/26/11 0812  BP: 142/82 141/84  Pulse: 92   Temp: 96.8 F (36 C)   Weight: 246 lb 12.8 oz (111.948 kg)     HISTORY: OB History    Grav Para Term Preterm Abortions TAB SAB Ect Mult Living   2 1 1       1      # Outc Date GA Lbr Len/2nd Wgt Sex Del Anes PTL Lv   1 TRM 4/12 [redacted]w[redacted]d  6lb1.9oz(2.776kg) F SVD EPI  Yes   Comments: System Generated. Please review and update pregnancy details.   2 CUR              Past Medical History  Diagnosis Date  . Hypertension   . Obesity    Past Surgical History  Procedure Date  . Laparoscopic cholecystectomy w/ cholangiography 06/10/10    Dr Gaynelle Adu   Family History  Problem Relation Age of Onset  . Hypertension Sister   . Lupus Sister   . Diabetes Sister   . Cancer Maternal Grandmother     GI  . Cancer Maternal Aunt     Breast  . Cancer Maternal Uncle     colon     Exam    Uterine Size: size equals dates  Pelvic Exam:    Perineum: Normal Perineum   Vulva: normal, Bartholin's, Urethra, Skene's normal   Vagina:  normal mucosa       Cervix: multiparous appearance   Adnexa: normal adnexa   Bony Pelvis: average       Skin: normal coloration and turgor, no rashes    Neurologic: oriented, normal   Extremities: no deformities   HEENT sclera clear, anicteric   Mouth/Teeth mucous membranes moist, pharynx normal without lesions   Neck supple   Cardiovascular: regular rate and rhythm   Respiratory:  appears well, vitals normal, no respiratory distress, acyanotic, normal RR, ear and throat exam is normal, neck free of mass or lymphadenopathy, chest clear, no wheezing, crepitations, rhonchi, normal symmetric air entry   Abdomen:  soft, non-tender; bowel sounds normal; no masses,  no organomegaly          Assessment:    Pregnancy: G2P1001 Patient Active Problem List  Diagnoses  . Hypertension  . Supervision of high-risk pregnancy  . Chronic hypertension in pregnancy        Plan:     Initial labs drawn. Prenatal vitamins. Problem list reviewed and updated. Genetic Screening discussed First Screen: ordered.  Ultrasound discussed; fetal survey: discussed.  Follow up in 3 weeks. Baseline labs and 24 hour urine Pap and GC/Chlam today   Jyra Lagares S 02/26/2011

## 2011-02-26 NOTE — Patient Instructions (Addendum)
Pregnancy - First Trimester During sexual intercourse, millions of sperm go into the vagina. Only 1 sperm will penetrate and fertilize the female egg while it is in the Fallopian tube. One week later, the fertilized egg implants into the wall of the uterus. An embryo begins to develop into a baby. At 6 to 8 weeks, the eyes and face are formed and the heartbeat can be seen on ultrasound. At the end of 12 weeks (first trimester), all the baby's organs are formed. Now that you are pregnant, you will want to do everything you can to have a healthy baby. Two of the most important things are to get good prenatal care and follow your caregiver's instructions. Prenatal care is all the medical care you receive before the baby's birth. It is given to prevent, find, and treat problems during the pregnancy and childbirth. PRENATAL EXAMS  During prenatal visits, your weight, blood pressure and urine are checked. This is done to make sure you are healthy and progressing normally during the pregnancy.   A pregnant woman should gain 25 to 35 pounds during the pregnancy. However, if you are over weight or underweight, your caregiver will advise you regarding your weight.   Your caregiver will ask and answer questions for you.   Blood work, cervical cultures, other necessary tests and a Pap test are done during your prenatal exams. These tests are done to check on your health and the probable health of your baby. Tests are strongly recommended and done for HIV with your permission. This is the virus that causes AIDS. These tests are done because medications can be given to help prevent your baby from being born with this infection should you have been infected without knowing it. Blood work is also used to find out your blood type, previous infections and follow your blood levels (hemoglobin).   Low hemoglobin (anemia) is common during pregnancy. Iron and vitamins are given to help prevent this. Later in the pregnancy,  blood tests for diabetes will be done along with any other tests if any problems develop. You may need tests to make sure you and the baby are doing well.   You may need other tests to make sure you and the baby are doing well.  CHANGES DURING THE FIRST TRIMESTER (THE FIRST 3 MONTHS OF PREGNANCY) Your body goes through many changes during pregnancy. They vary from person to person. Talk to your caregiver about changes you notice and are concerned about. Changes can include:  Your menstrual period stops.   The egg and sperm carry the genes that determine what you look like. Genes from you and your partner are forming a baby. The female genes determine whether the baby is a boy or a girl.   Your body increases in girth and you may feel bloated.   Feeling sick to your stomach (nauseous) and throwing up (vomiting). If the vomiting is uncontrollable, call your caregiver.   Your breasts will begin to enlarge and become tender.   Your nipples may stick out more and become darker.   The need to urinate more. Painful urination may mean you have a bladder infection.   Tiring easily.   Loss of appetite.   Cravings for certain kinds of food.   At first, you may gain or lose a couple of pounds.   You may have changes in your emotions from day to day (excited to be pregnant or concerned something may go wrong with the pregnancy and baby).     You may have more vivid and strange dreams.  HOME CARE INSTRUCTIONS   It is very important to avoid all smoking, alcohol and un-prescribed drugs during your pregnancy. These affect the formation and growth of the baby. Avoid chemicals while pregnant to ensure the delivery of a healthy infant.   Start your prenatal visits by the 12th week of pregnancy. They are usually scheduled monthly at first, then more often in the last 2 months before delivery. Keep your caregiver's appointments. Follow your caregiver's instructions regarding medication use, blood and lab  tests, exercise, and diet.   During pregnancy, you are providing food for you and your baby. Eat regular, well-balanced meals. Choose foods such as meat, fish, milk and other low fat dairy products, vegetables, fruits, and whole-grain breads and cereals. Your caregiver will tell you of the ideal weight gain.   You can help morning sickness by keeping soda crackers at the bedside. Eat a couple before arising in the morning. You may want to use the crackers without salt on them.   Eating 4 to 5 small meals rather than 3 large meals a day also may help the nausea and vomiting.   Drinking liquids between meals instead of during meals also seems to help nausea and vomiting.   A physical sexual relationship may be continued throughout pregnancy if there are no other problems. Problems may be early (premature) leaking of amniotic fluid from the membranes, vaginal bleeding, or belly (abdominal) pain.   Exercise regularly if there are no restrictions. Check with your caregiver or physical therapist if you are unsure of the safety of some of your exercises. Greater weight gain will occur in the last 2 trimesters of pregnancy. Exercising will help:   Control your weight.   Keep you in shape.   Prepare you for labor and delivery.   Help you lose your pregnancy weight after you deliver your baby.   Wear a good support or jogging bra for breast tenderness during pregnancy. This may help if worn during sleep too.   Ask when prenatal classes are available. Begin classes when they are offered.   Do not use hot tubs, steam rooms or saunas.   Wear your seat belt when driving. This protects you and your baby if you are in an accident.   Avoid raw meat, uncooked cheese, cat litter boxes and soil used by cats throughout the pregnancy. These carry germs that can cause birth defects in the baby.   The first trimester is a good time to visit your dentist for your dental health. Getting your teeth cleaned is  OK. Use a softer toothbrush and brush gently during pregnancy.   Ask for help if you have financial, counseling or nutritional needs during pregnancy. Your caregiver will be able to offer counseling for these needs as well as refer you for other special needs.   Do not take any medications or herbs unless told by your caregiver.   Inform your caregiver if there is any mental or physical domestic violence.   Make a list of emergency phone numbers of family, friends, hospital, and police and fire departments.   Write down your questions. Take them to your prenatal visit.   Do not douche.   Do not cross your legs.   If you have to stand for long periods of time, rotate you feet or take small steps in a circle.   You may have more vaginal secretions that may require a sanitary pad. Do not use   tampons or scented sanitary pads.  MEDICATIONS AND DRUG USE IN PREGNANCY  Take prenatal vitamins as directed. The vitamin should contain 1 milligram of folic acid. Keep all vitamins out of reach of children. Only a couple vitamins or tablets containing iron may be fatal to a baby or young child when ingested.   Avoid use of all medications, including herbs, over-the-counter medications, not prescribed or suggested by your caregiver. Only take over-the-counter or prescription medicines for pain, discomfort, or fever as directed by your caregiver. Do not use aspirin, ibuprofen, or naproxen unless directed by your caregiver.   Let your caregiver also know about herbs you may be using.   Alcohol is related to a number of birth defects. This includes fetal alcohol syndrome. All alcohol, in any form, should be avoided completely. Smoking will cause low birth rate and premature babies.   Street or illegal drugs are very harmful to the baby. They are absolutely forbidden. A baby born to an addicted mother will be addicted at birth. The baby will go through the same withdrawal an adult does.   Let your  caregiver know about any medications that you have to take and for what reason you take them.  MISCARRIAGE IS COMMON DURING PREGNANCY A miscarriage does not mean you did something wrong. It is not a reason to worry about getting pregnant again. Your caregiver will help you with questions you may have. If you have a miscarriage, you may need minor surgery. SEEK MEDICAL CARE IF:  You have any concerns or worries during your pregnancy. It is better to call with your questions if you feel they cannot wait, rather than worry about them. SEEK IMMEDIATE MEDICAL CARE IF:   An unexplained oral temperature above 102 F (38.9 C) develops, or as your caregiver suggests.   You have leaking of fluid from the vagina (birth canal). If leaking membranes are suspected, take your temperature and inform your caregiver of this when you call.   There is vaginal spotting or bleeding. Notify your caregiver of the amount and how many pads are used.   You develop a bad smelling vaginal discharge with a change in the color.   You continue to feel sick to your stomach (nauseated) and have no relief from remedies suggested. You vomit blood or coffee ground-like materials.   You lose more than 2 pounds of weight in 1 week.   You gain more than 2 pounds of weight in 1 week and you notice swelling of your face, hands, feet, or legs.   You gain 5 pounds or more in 1 week (even if you do not have swelling of your hands, face, legs, or feet).   You get exposed to German measles and have never had them.   You are exposed to fifth disease or chickenpox.   You develop belly (abdominal) pain. Round ligament discomfort is a common non-cancerous (benign) cause of abdominal pain in pregnancy. Your caregiver still must evaluate this.   You develop headache, fever, diarrhea, pain with urination, or shortness of breath.   You fall or are in a car accident or have any kind of trauma.   There is mental or physical violence in  your home.  Document Released: 12/16/2000 Document Revised: 09/03/2010 Document Reviewed: 06/19/2008 [employer] Patient Information 2012 [employer]. Breastfeeding BENEFITS OF BREASTFEEDING For the baby  The first milk (colostrum) helps the baby's digestive system function better.   There are antibodies from the mother in the milk that help   the baby fight off infections.   The baby has a lower incidence of asthma, allergies, and SIDS (sudden infant death syndrome).   The nutrients in breast milk are better than formulas for the baby and helps the baby's brain grow better.   Babies who breastfeed have less gas, colic, and constipation.  For the mother  Breastfeeding helps develop a very special bond between mother and baby.   It is more convenient, always available at the correct temperature and cheaper than formula feeding.   It burns calories in the mother and helps with losing weight that was gained during pregnancy.   It makes the uterus contract back down to normal size faster and slows bleeding following delivery.   Breastfeeding mothers have a lower risk of developing breast cancer.  NURSE FREQUENTLY  A healthy, full-term baby may breastfeed as often as every hour or space his or her feedings to every 3 hours.   How often to nurse will vary from baby to baby. Watch your baby for signs of hunger, not the clock.   Nurse as often as the baby requests, or when you feel the need to reduce the fullness of your breasts.   Awaken the baby if it has been 3 to 4 hours since the last feeding.   Frequent feeding will help the mother make more milk and will prevent problems like sore nipples and engorgement of the breasts.  BABY'S POSITION AT THE BREAST  Whether lying down or sitting, be sure that the baby's tummy is facing your tummy.   Support the breast with 4 fingers underneath the breast and the thumb above. Make sure your fingers are well away from the nipple and baby's  mouth.   Stroke the baby's lips and cheek closest to the breast gently with your finger or nipple.   When the baby's mouth is open wide enough, place all of your nipple and as much of the dark area around the nipple as possible into your baby's mouth.   Pull the baby in close so the tip of the nose and the baby's cheeks touch the breast during the feeding.  FEEDINGS  The length of each feeding varies from baby to baby and from feeding to feeding.   The baby must suck about 2 to 3 minutes for your milk to get to him or her. This is called a "let down." For this reason, allow the baby to feed on each breast as long as he or she wants. Your baby will end the feeding when he or she has received the right balance of nutrients.   To break the suction, put your finger into the corner of the baby's mouth and slide it between his or her gums before removing your breast from his or her mouth. This will help prevent sore nipples.  REDUCING BREAST ENGORGEMENT  In the first week after your baby is born, you may experience signs of breast engorgement. When breasts are engorged, they feel heavy, warm, full, and may be tender to the touch. You can reduce engorgement if you:   Nurse frequently, every 2 to 3 hours. Mothers who breastfeed early and often have fewer problems with engorgement.   Place light ice packs on your breasts between feedings. This reduces swelling. Wrap the ice packs in a lightweight towel to protect your skin.   Apply moist hot packs to your breast for 5 to 10 minutes before each feeding. This increases circulation and helps the milk flow.     Gently massage your breast before and during the feeding.   Make sure that the baby empties at least one breast at every feeding before switching sides.   Use a breast pump to empty the breasts if your baby is sleepy or not nursing well. You may also want to pump if you are returning to work or or you feel you are getting engorged.   Avoid  bottle feeds, pacifiers or supplemental feedings of water or juice in place of breastfeeding.   Be sure the baby is latched on and positioned properly while breastfeeding.   Prevent fatigue, stress, and anemia.   Wear a supportive bra, avoiding underwire styles.   Eat a balanced diet with enough fluids.  If you follow these suggestions, your engorgement should improve in 24 to 48 hours. If you are still experiencing difficulty, call your lactation consultant or caregiver. IS MY BABY GETTING ENOUGH MILK? Sometimes, mothers worry about whether their babies are getting enough milk. You can be assured that your baby is getting enough milk if:  The baby is actively sucking and you hear swallowing.   The baby nurses at least 8 to 12 times in a 24 hour time period. Nurse your baby until he or she unlatches or falls asleep at the first breast (at least 10 to 20 minutes), then offer the second side.   The baby is wetting 5 to 6 disposable diapers (6 to 8 cloth diapers) in a 24 hour period by 5 to 6 days of age.   The baby is having at least 2 to 3 stools every 24 hours for the first few months. Breast milk is all the food your baby needs. It is not necessary for your baby to have water or formula. In fact, to help your breasts make more milk, it is best not to give your baby supplemental feedings during the early weeks.   The stool should be soft and yellow.   The baby should gain 4 to 7 ounces per week after he is 4 days old.  TAKE CARE OF YOURSELF Take care of your breasts by:  Bathing or showering daily.   Avoiding the use of soaps on your nipples.   Start feedings on your left breast at one feeding and on your right breast at the next feeding.   You will notice an increase in your milk supply 2 to 5 days after delivery. You may feel some discomfort from engorgement, which makes your breasts very firm and often tender. Engorgement "peaks" out within 24 to 48 hours. In the meantime, apply  warm moist towels to your breasts for 5 to 10 minutes before feeding. Gentle massage and expression of some milk before feeding will soften your breasts, making it easier for your baby to latch on. Wear a well fitting nursing bra and air dry your nipples for 10 to 15 minutes after each feeding.   Only use cotton bra pads.   Only use pure lanolin on your nipples after nursing. You do not need to wash it off before nursing.  Take care of yourself by:   Eating well-balanced meals and nutritious snacks.   Drinking milk, fruit juice, and water to satisfy your thirst (about 8 glasses a day).   Getting plenty of rest.   Increasing calcium in your diet (1200 mg a day).   Avoiding foods that you notice affect the baby in a bad way.  SEEK MEDICAL CARE IF:   You have any questions or difficulty   with breastfeeding.   You need help.   You have a hard, red, sore area on your breast, accompanied by a fever of 100.5 F (38.1 C) or more.   Your baby is too sleepy to eat well or is having trouble sleeping.   Your baby is wetting less than 6 diapers per day, by 5 days of age.   Your baby's skin or white part of his or her eyes is more yellow than it was in the hospital.   You feel depressed.  Document Released: 12/22/2004 Document Revised: 09/03/2010 Document Reviewed: 08/06/2008 [employer] Patient Information 2012 [employer]. 

## 2011-03-09 ENCOUNTER — Telehealth: Payer: Self-pay | Admitting: Family Medicine

## 2011-03-09 NOTE — Telephone Encounter (Signed)
Patient is calling to find what kind of cold medicine is ok to take since she is pregnant.

## 2011-03-10 NOTE — Telephone Encounter (Signed)
lvm and informed pt of the following that she can take for cold sx:  cepacol throat lozenges, cold-eeze (40mg  per day), flonase (rx), robitussin (alcohol free), tylenol (2 reg. Strength q 4 hours, 2 extra strength q 6 hours), vitamin c drops (1000 mg q day).Laureen Ochs, Viann Shove

## 2011-03-19 ENCOUNTER — Ambulatory Visit (INDEPENDENT_AMBULATORY_CARE_PROVIDER_SITE_OTHER): Payer: Medicaid Other | Admitting: Obstetrics & Gynecology

## 2011-03-19 ENCOUNTER — Telehealth: Payer: Self-pay | Admitting: *Deleted

## 2011-03-19 VITALS — BP 134/83 | HR 75 | Temp 97.7°F | Wt 240.8 lb

## 2011-03-19 DIAGNOSIS — O10019 Pre-existing essential hypertension complicating pregnancy, unspecified trimester: Secondary | ICD-10-CM

## 2011-03-19 DIAGNOSIS — O10919 Unspecified pre-existing hypertension complicating pregnancy, unspecified trimester: Secondary | ICD-10-CM

## 2011-03-19 LAB — COMPREHENSIVE METABOLIC PANEL
ALT: 20 U/L (ref 0–35)
AST: 16 U/L (ref 0–37)
Albumin: 3.8 g/dL (ref 3.5–5.2)
Alkaline Phosphatase: 88 U/L (ref 39–117)
BUN: 5 mg/dL — ABNORMAL LOW (ref 6–23)
CO2: 24 mEq/L (ref 19–32)
Calcium: 9.6 mg/dL (ref 8.4–10.5)
Chloride: 104 mEq/L (ref 96–112)
Creat: 0.64 mg/dL (ref 0.50–1.10)
Glucose, Bld: 89 mg/dL (ref 70–99)
Potassium: 3.9 mEq/L (ref 3.5–5.3)
Sodium: 138 mEq/L (ref 135–145)
Total Bilirubin: 0.5 mg/dL (ref 0.3–1.2)
Total Protein: 7.1 g/dL (ref 6.0–8.3)

## 2011-03-19 LAB — POCT URINALYSIS DIP (DEVICE)
Glucose, UA: NEGATIVE mg/dL
Nitrite: NEGATIVE
Protein, ur: 100 mg/dL — AB
Specific Gravity, Urine: 1.025 (ref 1.005–1.030)
Urobilinogen, UA: >=8 mg/dL (ref 0.0–1.0)
pH: 6.5 (ref 5.0–8.0)

## 2011-03-19 NOTE — Telephone Encounter (Signed)
Telephoned pt's home # left message to return call to clinic.

## 2011-03-19 NOTE — Progress Notes (Signed)
Pulse: 75

## 2011-03-19 NOTE — Telephone Encounter (Signed)
Message copied by Lynnell Dike on Thu Mar 19, 2011  8:33 AM ------      Message from: Reva Bores      Created: Tue Mar 17, 2011 11:42 AM       May treat with Flagyl 2 gms po, have her get partner treated      ----- Message -----         From: Lurlean Horns, LPN         Sent: 03/17/2011  10:53 AM           To: Reva Bores, MD            Please review abnormal pap and pap also showed trichmomonas. Please advise.

## 2011-03-19 NOTE — Patient Instructions (Signed)
Breastfeeding BENEFITS OF BREASTFEEDING For the baby  The first milk (colostrum) helps the baby's digestive system function better.   There are antibodies from the mother in the milk that help the baby fight off infections.   The baby has a lower incidence of asthma, allergies, and SIDS (sudden infant death syndrome).   The nutrients in breast milk are better than formulas for the baby and helps the baby's brain grow better.   Babies who breastfeed have less gas, colic, and constipation.  For the mother  Breastfeeding helps develop a very special bond between mother and baby.   It is more convenient, always available at the correct temperature and cheaper than formula feeding.   It burns calories in the mother and helps with losing weight that was gained during pregnancy.   It makes the uterus contract back down to normal size faster and slows bleeding following delivery.   Breastfeeding mothers have a lower risk of developing breast cancer.  NURSE FREQUENTLY  A healthy, full-term baby may breastfeed as often as every hour or space his or her feedings to every 3 hours.   How often to nurse will vary from baby to baby. Watch your baby for signs of hunger, not the clock.   Nurse as often as the baby requests, or when you feel the need to reduce the fullness of your breasts.   Awaken the baby if it has been 3 to 4 hours since the last feeding.   Frequent feeding will help the mother make more milk and will prevent problems like sore nipples and engorgement of the breasts.  BABY'S POSITION AT THE BREAST  Whether lying down or sitting, be sure that the baby's tummy is facing your tummy.   Support the breast with 4 fingers underneath the breast and the thumb above. Make sure your fingers are well away from the nipple and baby's mouth.   Stroke the baby's lips and cheek closest to the breast gently with your finger or nipple.   When the baby's mouth is open wide enough, place all  of your nipple and as much of the dark area around the nipple as possible into your baby's mouth.   Pull the baby in close so the tip of the nose and the baby's cheeks touch the breast during the feeding.  FEEDINGS  The length of each feeding varies from baby to baby and from feeding to feeding.   The baby must suck about 2 to 3 minutes for your milk to get to him or her. This is called a "let down." For this reason, allow the baby to feed on each breast as long as he or she wants. Your baby will end the feeding when he or she has received the right balance of nutrients.   To break the suction, put your finger into the corner of the baby's mouth and slide it between his or her gums before removing your breast from his or her mouth. This will help prevent sore nipples.  REDUCING BREAST ENGORGEMENT  In the first week after your baby is born, you may experience signs of breast engorgement. When breasts are engorged, they feel heavy, warm, full, and may be tender to the touch. You can reduce engorgement if you:   Nurse frequently, every 2 to 3 hours. Mothers who breastfeed early and often have fewer problems with engorgement.   Place light ice packs on your breasts between feedings. This reduces swelling. Wrap the ice packs in a   lightweight towel to protect your skin.   Apply moist hot packs to your breast for 5 to 10 minutes before each feeding. This increases circulation and helps the milk flow.   Gently massage your breast before and during the feeding.   Make sure that the baby empties at least one breast at every feeding before switching sides.   Use a breast pump to empty the breasts if your baby is sleepy or not nursing well. You may also want to pump if you are returning to work or or you feel you are getting engorged.   Avoid bottle feeds, pacifiers or supplemental feedings of water or juice in place of breastfeeding.   Be sure the baby is latched on and positioned properly while  breastfeeding.   Prevent fatigue, stress, and anemia.   Wear a supportive bra, avoiding underwire styles.   Eat a balanced diet with enough fluids.  If you follow these suggestions, your engorgement should improve in 24 to 48 hours. If you are still experiencing difficulty, call your lactation consultant or caregiver. IS MY BABY GETTING ENOUGH MILK? Sometimes, mothers worry about whether their babies are getting enough milk. You can be assured that your baby is getting enough milk if:  The baby is actively sucking and you hear swallowing.   The baby nurses at least 8 to 12 times in a 24 hour time period. Nurse your baby until he or she unlatches or falls asleep at the first breast (at least 10 to 20 minutes), then offer the second side.   The baby is wetting 5 to 6 disposable diapers (6 to 8 cloth diapers) in a 24 hour period by 5 to 6 days of age.   The baby is having at least 2 to 3 stools every 24 hours for the first few months. Breast milk is all the food your baby needs. It is not necessary for your baby to have water or formula. In fact, to help your breasts make more milk, it is best not to give your baby supplemental feedings during the early weeks.   The stool should be soft and yellow.   The baby should gain 4 to 7 ounces per week after he is 4 days old.  TAKE CARE OF YOURSELF Take care of your breasts by:  Bathing or showering daily.   Avoiding the use of soaps on your nipples.   Start feedings on your left breast at one feeding and on your right breast at the next feeding.   You will notice an increase in your milk supply 2 to 5 days after delivery. You may feel some discomfort from engorgement, which makes your breasts very firm and often tender. Engorgement "peaks" out within 24 to 48 hours. In the meantime, apply warm moist towels to your breasts for 5 to 10 minutes before feeding. Gentle massage and expression of some milk before feeding will soften your breasts, making  it easier for your baby to latch on. Wear a well fitting nursing bra and air dry your nipples for 10 to 15 minutes after each feeding.   Only use cotton bra pads.   Only use pure lanolin on your nipples after nursing. You do not need to wash it off before nursing.  Take care of yourself by:   Eating well-balanced meals and nutritious snacks.   Drinking milk, fruit juice, and water to satisfy your thirst (about 8 glasses a day).   Getting plenty of rest.   Increasing calcium in   your diet (1200 mg a day).   Avoiding foods that you notice affect the baby in a bad way.  SEEK MEDICAL CARE IF:   You have any questions or difficulty with breastfeeding.   You need help.   You have a hard, red, sore area on your breast, accompanied by a fever of 100.5 F (38.1 C) or more.   Your baby is too sleepy to eat well or is having trouble sleeping.   Your baby is wetting less than 6 diapers per day, by 5 days of age.   Your baby's skin or white part of his or her eyes is more yellow than it was in the hospital.   You feel depressed.  Document Released: 12/22/2004 Document Revised: 12/11/2010 Document Reviewed: 08/06/2008 [employer] Patient Information 2012 [employer]. 

## 2011-03-19 NOTE — Progress Notes (Signed)
March 29th first screen.  Blood and protein in urine.  Will send urine culture.

## 2011-03-20 LAB — CREATININE CLEARANCE, URINE, 24 HOUR
Creatinine Clearance: 160 mL/min — ABNORMAL HIGH (ref 75–115)
Creatinine, 24H Ur: 1474 mg/d (ref 700–1800)
Creatinine: 0.64 mg/dL (ref 0.50–1.10)

## 2011-03-20 LAB — PROTEIN, URINE, 24 HOUR: Protein, 24H Urine: 72 mg/d (ref 50–100)

## 2011-03-20 NOTE — Telephone Encounter (Signed)
Left message that I was calling with test result information and treatment prescribed. Please call back and leave message on nurse voice mail of when she can be reached. We may not be able to return her call after 1130 today- will call back next week.

## 2011-03-23 MED ORDER — METRONIDAZOLE 500 MG PO TABS: 2000.0000 mg | ORAL_TABLET | Freq: Once | ORAL | Status: AC

## 2011-03-23 NOTE — Telephone Encounter (Signed)
Pt left message on Friday @ 1032 that she was returning our call.  I spoke with her and explained abnormal Pap, need for Colpo and Trich infection. Pt was advised to have her partner get treated for trich. She stated that she is no longer having sex and does not have relationship with the individual. She will obtain Rx and we will call back [id number] appt.  Pt voiced understanding and stated to contact her @ [phone]- may leave a message of appt details.

## 2011-03-24 ENCOUNTER — Telehealth: Payer: Self-pay | Admitting: *Deleted

## 2011-03-24 MED ORDER — METRONIDAZOLE 500 MG PO TABS: ORAL_TABLET | ORAL | Status: DC

## 2011-03-24 NOTE — Telephone Encounter (Signed)
Returned pt call and she stated that she vomited immediately after taking 2 of the 4 pills of Flagyl prescribed. She is asking if there is an alternate medication or can we prescribe 2 additional pills and she will try again.

## 2011-03-24 NOTE — Telephone Encounter (Signed)
Pt left message stating that she has a question about her Rx

## 2011-03-24 NOTE — Telephone Encounter (Signed)
After consult w/Dr. Jolayne Panther, I spoke w/pt and gave her the following options: she may try 2 additional pills with the 2 remaining pills or she may take 1 pill twice daily for 7 days.  Pt would like to try the 2 additional pills. Rx sent to pharmacy as requested.

## 2011-03-25 ENCOUNTER — Other Ambulatory Visit: Payer: Self-pay

## 2011-03-25 ENCOUNTER — Ambulatory Visit (HOSPITAL_COMMUNITY)
Admission: RE | Admit: 2011-03-25 | Discharge: 2011-03-25 | Disposition: A | Discharge status: Home / Self Care | Payer: Medicaid Other | Source: Ambulatory Visit | Attending: Family Medicine | Admitting: Family Medicine

## 2011-03-25 ENCOUNTER — Encounter: Payer: Self-pay | Admitting: Family Medicine

## 2011-03-25 VITALS — BP 144/72 | HR 62 | Wt 241.0 lb

## 2011-03-25 DIAGNOSIS — O351XX0 Maternal care for (suspected) chromosomal abnormality in fetus, not applicable or unspecified: Secondary | ICD-10-CM | POA: Insufficient documentation

## 2011-03-25 DIAGNOSIS — O10919 Unspecified pre-existing hypertension complicating pregnancy, unspecified trimester: Secondary | ICD-10-CM

## 2011-03-25 DIAGNOSIS — O10019 Pre-existing essential hypertension complicating pregnancy, unspecified trimester: Secondary | ICD-10-CM | POA: Insufficient documentation

## 2011-03-25 DIAGNOSIS — O3510X0 Maternal care for (suspected) chromosomal abnormality in fetus, unspecified, not applicable or unspecified: Secondary | ICD-10-CM | POA: Insufficient documentation

## 2011-03-25 DIAGNOSIS — Z3689 Encounter for other specified antenatal screening: Secondary | ICD-10-CM | POA: Insufficient documentation

## 2011-03-25 DIAGNOSIS — Z3682 Encounter for antenatal screening for nuchal translucency: Secondary | ICD-10-CM

## 2011-03-25 NOTE — Progress Notes (Signed)
Obstetric ultrasound for nuchal translucency performed today.  Findings reassuring.  Blood sample for first trimester screening also obtained.  Please see [facility] for full report.  Follow up ultrasound scheduled in 6 weeks for evaluation of fetal anatomy.

## 2011-04-06 ENCOUNTER — Telehealth: Payer: Self-pay | Admitting: *Deleted

## 2011-04-06 NOTE — Telephone Encounter (Signed)
Called pt and spoke w/female who stated that Carol Blair was not there right now. I asked her to give Torah a message to call the nurse line with details of her question or problem. She agreed.

## 2011-04-06 NOTE — Telephone Encounter (Signed)
Pt left message stating that she has questions about her medications and the sx she is having.

## 2011-04-09 ENCOUNTER — Ambulatory Visit (INDEPENDENT_AMBULATORY_CARE_PROVIDER_SITE_OTHER): Payer: Medicaid Other | Admitting: Advanced Practice Midwife

## 2011-04-09 VITALS — BP 137/83 | Temp 96.7°F | Wt 237.1 lb

## 2011-04-09 DIAGNOSIS — R87811 Vaginal high risk human papillomavirus (HPV) DNA test positive: Secondary | ICD-10-CM

## 2011-04-09 DIAGNOSIS — O10919 Unspecified pre-existing hypertension complicating pregnancy, unspecified trimester: Secondary | ICD-10-CM

## 2011-04-09 DIAGNOSIS — N879 Dysplasia of cervix uteri, unspecified: Secondary | ICD-10-CM | POA: Insufficient documentation

## 2011-04-09 DIAGNOSIS — O10019 Pre-existing essential hypertension complicating pregnancy, unspecified trimester: Secondary | ICD-10-CM

## 2011-04-09 DIAGNOSIS — A5901 Trichomonal vulvovaginitis: Secondary | ICD-10-CM

## 2011-04-09 DIAGNOSIS — IMO0002 Reserved for concepts with insufficient information to code with codable children: Secondary | ICD-10-CM

## 2011-04-09 LAB — POCT URINALYSIS DIP (DEVICE)
Bilirubin Urine: NEGATIVE
Glucose, UA: NEGATIVE mg/dL
Hgb urine dipstick: NEGATIVE
Ketones, ur: NEGATIVE mg/dL
Nitrite: NEGATIVE
Specific Gravity, Urine: <=1.005 (ref 1.005–1.030)

## 2011-04-09 NOTE — Patient Instructions (Signed)
Abnormal Pap Test WHAT DOES A PAP TEST CHECK FOR? A Pap test checks the cells in the cervix for any infections or changes that may turn into cancer. The cells are checked to see if they look normal or if they show any changes (abnormal). Abnormal changes may be a sign of problems with your cervix. Cervical cells that are abnormal are called cervical dysplasia. Dysplasia is not cancer. It is a pre-cancerous change in the cells of your cervix. WHAT DOES AN ABNORMAL PAP TEST MEAN? Most times, an abnormal Pap test does not mean you have cancer. However, it does show that there may be a problem. Your doctor will want to do other tests to find out more about the abnormal cells.  Your abnormal Pap test results could show:  Small changes that should be carefully watched.   Dysplasia that could grow into cancer.   Cancer.  When dysplasia is found and treated early, it most often does not grow into cancer. WHAT WILL BE DONE ABOUT MY ABNORMAL PAP TEST? You may have:  A colposcopy test done. Your cevix will be looked at using a strong light and a microscope.   A cone biopsy. A small, cone-shaped sample of your cervix is taken out. The part that is taken out is the area where the abnormal cells are.   Cryosurgery. The abnormal cells on your cervix will be frozen.   Loop Electrical Excision Procedure (LEEP). The abnormal cells will be taken out.  WHAT IF I HAVE A DYSPLASIA OR A CANCER? You and your doctor may choose a hysterectomy as the best treatment for you. During this surgery, your womb (uterus) and your cervix will be taken out. WHAT SHOULD YOU DO AFTER BEING TREATED? Keep having Pap tests and checkups as often as your doctor tells you. Your cervical problem will be carefully watched so it does not get worse. Also, your doctor can watch for, and treat, any new problems that may come up. Document Released: 04/08/2010 Document Revised: 12/11/2010 Document Reviewed: 11/23/2010 [facility] Patient  Information 2012 [place], [place].Pregnancy - Second Trimester The second trimester of pregnancy (3 to 6 months) is a period of rapid growth for you and your baby. At the end of the sixth month, your baby is about 9 inches long and weighs 1 1/2 pounds. You will begin to feel the baby move between 18 and 20 weeks of the pregnancy. This is called quickening. Weight gain is faster. A clear fluid (colostrum) may leak out of your breasts. You may feel small contractions of the womb (uterus). This is known as false labor or Braxton-Hicks contractions. This is like a practice for labor when the baby is ready to be born. Usually, the problems with morning sickness have usually passed by the end of your first trimester. Some women develop small dark blotches (called cholasma, mask of pregnancy) on their face that usually goes away after the baby is born. Exposure to the sun makes the blotches worse. Acne may also develop in some pregnant women and pregnant women who have acne, may find that it goes away. PRENATAL EXAMS  Blood work may continue to be done during prenatal exams. These tests are done to check on your health and the probable health of your baby. Blood work is used to follow your blood levels (hemoglobin). Anemia (low hemoglobin) is common during pregnancy. Iron and vitamins are given to help prevent this. You will also be checked for diabetes between 24 and 28 weeks of the  pregnancy. Some of the previous blood tests may be repeated.   The size of the uterus is measured during each visit. This is to make sure that the baby is continuing to grow properly according to the dates of the pregnancy.   Your blood pressure is checked every prenatal visit. This is to make sure you are not getting toxemia.   Your urine is checked to make sure you do not have an infection, diabetes or protein in the urine.   Your weight is checked often to make sure gains are happening at the suggested rate. This is to ensure that  both you and your baby are growing normally.   Sometimes, an ultrasound is performed to confirm the proper growth and development of the baby. This is a test which bounces harmless sound waves off the baby so your caregiver can more accurately determine due dates.  Sometimes, a specialized test is done on the amniotic fluid surrounding the baby. This test is called an amniocentesis. The amniotic fluid is obtained by sticking a needle into the belly (abdomen). This is done to check the chromosomes in instances where there is a concern about possible genetic problems with the baby. It is also sometimes done near the end of pregnancy if an early delivery is required. In this case, it is done to help make sure the baby's lungs are mature enough for the baby to live outside of the womb. CHANGES OCCURING IN THE SECOND TRIMESTER OF PREGNANCY Your body goes through many changes during pregnancy. They vary from person to person. Talk to your caregiver about changes you notice that you are concerned about.  During the second trimester, you will likely have an increase in your appetite. It is normal to have cravings for certain foods. This varies from person to person and pregnancy to pregnancy.   Your lower abdomen will begin to bulge.   You may have to urinate more often because the uterus and baby are pressing on your bladder. It is also common to get more bladder infections during pregnancy (pain with urination). You can help this by drinking lots of fluids and emptying your bladder before and after intercourse.   You may begin to get stretch marks on your hips, abdomen, and breasts. These are normal changes in the body during pregnancy. There are no exercises or medications to take that prevent this change.   You may begin to develop swollen and bulging veins (varicose veins) in your legs. Wearing support hose, elevating your feet for 15 minutes, 3 to 4 times a day and limiting salt in your diet helps  lessen the problem.   Heartburn may develop as the uterus grows and pushes up against the stomach. Antacids recommended by your caregiver helps with this problem. Also, eating smaller meals 4 to 5 times a day helps.   Constipation can be treated with a stool softener or adding bulk to your diet. Drinking lots of fluids, vegetables, fruits, and whole grains are helpful.   Exercising is also helpful. If you have been very active up until your pregnancy, most of these activities can be continued during your pregnancy. If you have been less active, it is helpful to start an exercise program such as walking.   Hemorrhoids (varicose veins in the rectum) may develop at the end of the second trimester. Warm sitz baths and hemorrhoid cream recommended by your caregiver helps hemorrhoid problems.   Backaches may develop during this time of your pregnancy. Avoid heavy  lifting, wear low heal shoes and practice good posture to help with backache problems.   Some pregnant women develop tingling and numbness of their hand and fingers because of swelling and tightening of ligaments in the wrist (carpel tunnel syndrome). This goes away after the baby is born.   As your breasts enlarge, you may have to get a bigger bra. Get a comfortable, cotton, support bra. Do not get a nursing bra until the last month of the pregnancy if you will be nursing the baby.   You may get a dark line from your belly button to the pubic area called the linea nigra.   You may develop rosy cheeks because of increase blood flow to the face.   You may develop spider looking lines of the face, neck, arms and chest. These go away after the baby is born.  HOME CARE INSTRUCTIONS   It is extremely important to avoid all smoking, herbs, alcohol, and unprescribed drugs during your pregnancy. These chemicals affect the formation and growth of the baby. Avoid these chemicals throughout the pregnancy to ensure the delivery of a healthy infant.    Most of your home care instructions are the same as suggested for the first trimester of your pregnancy. Keep your caregiver's appointments. Follow your caregiver's instructions regarding medication use, exercise and diet.   During pregnancy, you are providing food for you and your baby. Continue to eat regular, well-balanced meals. Choose foods such as meat, fish, milk and other low fat dairy products, vegetables, fruits, and whole-grain breads and cereals. Your caregiver will tell you of the ideal weight gain.   A physical sexual relationship may be continued up until near the end of pregnancy if there are no other problems. Problems could include early (premature) leaking of amniotic fluid from the membranes, vaginal bleeding, abdominal pain, or other medical or pregnancy problems.   Exercise regularly if there are no restrictions. Check with your caregiver if you are unsure of the safety of some of your exercises. The greatest weight gain will occur in the last 2 trimesters of pregnancy. Exercise will help you:   Control your weight.   Get you in shape for labor and delivery.   Lose weight after you have the baby.   Wear a good support or jogging bra for breast tenderness during pregnancy. This may help if worn during sleep. Pads or tissues may be used in the bra if you are leaking colostrum.   Do not use hot tubs, steam rooms or saunas throughout the pregnancy.   Wear your seat belt at all times when driving. This protects you and your baby if you are in an accident.   Avoid raw meat, uncooked cheese, cat litter boxes and soil used by cats. These carry germs that can cause birth defects in the baby.   The second trimester is also a good time to visit your dentist for your dental health if this has not been done yet. Getting your teeth cleaned is OK. Use a soft toothbrush. Brush gently during pregnancy.   It is easier to loose urine during pregnancy. Tightening up and strengthening  the pelvic muscles will help with this problem. Practice stopping your urination while you are going to the bathroom. These are the same muscles you need to strengthen. It is also the muscles you would use as if you were trying to stop from passing gas. You can practice tightening these muscles up 10 times a set and repeating this about  3 times per day. Once you know what muscles to tighten up, do not perform these exercises during urination. It is more likely to contribute to an infection by backing up the urine.   Ask for help if you have financial, counseling or nutritional needs during pregnancy. Your caregiver will be able to offer counseling for these needs as well as refer you for other special needs.   Your skin may become oily. If so, wash your face with mild soap, use non-greasy moisturizer and oil or cream based makeup.  MEDICATIONS AND DRUG USE IN PREGNANCY  Take prenatal vitamins as directed. The vitamin should contain 1 milligram of folic acid. Keep all vitamins out of reach of children. Only a couple vitamins or tablets containing iron may be fatal to a baby or young child when ingested.   Avoid use of all medications, including herbs, over-the-counter medications, not prescribed or suggested by your caregiver. Only take over-the-counter or prescription medicines for pain, discomfort, or fever as directed by your caregiver. Do not use aspirin.   Let your caregiver also know about herbs you may be using.   Alcohol is related to a number of birth defects. This includes fetal alcohol syndrome. All alcohol, in any form, should be avoided completely. Smoking will cause low birth rate and premature babies.   Street or illegal drugs are very harmful to the baby. They are absolutely forbidden. A baby born to an addicted mother will be addicted at birth. The baby will go through the same withdrawal an adult does.  SEEK MEDICAL CARE IF:  You have any concerns or worries during your pregnancy.  It is better to call with your questions if you feel they cannot wait, rather than worry about them. SEEK IMMEDIATE MEDICAL CARE IF:   An unexplained oral temperature above 102 F (38.9 C) develops, or as your caregiver suggests.   You have leaking of fluid from the vagina (birth canal). If leaking membranes are suspected, take your temperature and tell your caregiver of this when you call.   There is vaginal spotting, bleeding, or passing clots. Tell your caregiver of the amount and how many pads are used. Light spotting in pregnancy is common, especially following intercourse.   You develop a bad smelling vaginal discharge with a change in the color from clear to white.   You continue to feel sick to your stomach (nauseated) and have no relief from remedies suggested. You vomit blood or coffee ground-like materials.   You lose more than 2 pounds of weight or gain more than 2 pounds of weight over 1 week, or as suggested by your caregiver.   You notice swelling of your face, hands, feet, or legs.   You get exposed to [place] measles and have never had them.   You are exposed to fifth disease or chickenpox.   You develop belly (abdominal) pain. Round ligament discomfort is a common non-cancerous (benign) cause of abdominal pain in pregnancy. Your caregiver still must evaluate you.   You develop a bad headache that does not go away.   You develop fever, diarrhea, pain with urination, or shortness of breath.   You develop visual problems, blurry, or double vision.   You fall or are in a car accident or any kind of trauma.   There is mental or physical violence at home.  Document Released: 12/16/2000 Document Revised: 12/11/2010 Document Reviewed: 06/20/2008 [facility] Patient Information 2012 [place], [place].

## 2011-04-09 NOTE — Progress Notes (Signed)
Anatomy US scheduled May 1st. AFP at [place]. Dr. Shawnie Pons will review recommendations for ASCUS pos HRHPV for 21 year-olds.

## 2011-04-09 NOTE — Progress Notes (Signed)
Pulse- 98 

## 2011-05-06 ENCOUNTER — Ambulatory Visit (HOSPITAL_COMMUNITY)
Admission: RE | Admit: 2011-05-06 | Discharge: 2011-05-06 | Disposition: A | Discharge status: Home / Self Care | Payer: Medicaid Other | Source: Ambulatory Visit | Attending: Advanced Practice Midwife | Admitting: Advanced Practice Midwife

## 2011-05-06 DIAGNOSIS — Z363 Encounter for antenatal screening for malformations: Secondary | ICD-10-CM | POA: Insufficient documentation

## 2011-05-06 DIAGNOSIS — Z1389 Encounter for screening for other disorder: Secondary | ICD-10-CM | POA: Insufficient documentation

## 2011-05-06 DIAGNOSIS — O10019 Pre-existing essential hypertension complicating pregnancy, unspecified trimester: Secondary | ICD-10-CM | POA: Insufficient documentation

## 2011-05-06 DIAGNOSIS — Z3689 Encounter for other specified antenatal screening: Secondary | ICD-10-CM

## 2011-05-06 DIAGNOSIS — O358XX0 Maternal care for other (suspected) fetal abnormality and damage, not applicable or unspecified: Secondary | ICD-10-CM | POA: Insufficient documentation

## 2011-05-06 DIAGNOSIS — O10919 Unspecified pre-existing hypertension complicating pregnancy, unspecified trimester: Secondary | ICD-10-CM

## 2011-05-06 NOTE — Progress Notes (Signed)
Patient seen for detailed anatomy scan.  See [facility] report for details.  Alpha Gula, MD  Single IUP at 18 6/7 weeks and 18 4/7 weeks by ultrasound today. Fetal growth appropriate. Normal detailed anatomy scan. Normal first trimester screen. No markers associated with aneuploidy noted.   Follow up ultrasound as clinically indicated.

## 2011-05-07 ENCOUNTER — Ambulatory Visit (INDEPENDENT_AMBULATORY_CARE_PROVIDER_SITE_OTHER): Payer: Medicaid Other | Admitting: Obstetrics & Gynecology

## 2011-05-07 VITALS — BP 132/76 | Temp 97.1°F | Wt 235.3 lb

## 2011-05-07 DIAGNOSIS — IMO0002 Reserved for concepts with insufficient information to code with codable children: Secondary | ICD-10-CM

## 2011-05-07 DIAGNOSIS — R8271 Bacteriuria: Secondary | ICD-10-CM

## 2011-05-07 DIAGNOSIS — O10019 Pre-existing essential hypertension complicating pregnancy, unspecified trimester: Secondary | ICD-10-CM

## 2011-05-07 DIAGNOSIS — R87811 Vaginal high risk human papillomavirus (HPV) DNA test positive: Secondary | ICD-10-CM

## 2011-05-07 DIAGNOSIS — I10 Essential (primary) hypertension: Secondary | ICD-10-CM

## 2011-05-07 NOTE — Progress Notes (Signed)
Pulse: 72

## 2011-05-07 NOTE — Progress Notes (Signed)
Addended by: Lynnell Dike on: 05/07/2011 01:31 PM   Modules accepted: Orders

## 2011-05-07 NOTE — Progress Notes (Signed)
ASCCP guidelines sates rpt cytology in 21-22 yo females who are ASCUS with HPV positive (in 12 months).  Pt aware.  BP nml.  AFP today.

## 2011-05-07 NOTE — Patient Instructions (Signed)
Contraception Choices Birth control (contraception) can stop pregnancy from happening. Different types of birth control work in different ways. Some can:  Make the mucus in the cervix thick. This makes it hard for sperm to get into the uterus.   Thin the lining of the uterus. This makes it hard for an egg to attach to the wall of the uterus.   Stop the ovaries from releasing an egg.   Block the sperm from reaching the egg.  Certain types of surgery can stop pregnancy from happening. For women, the sugery closes the fallopian tubes (tubal ligation). For men, the surgery stops sperm from releasing during sex (vasectomy). HORMONAL BIRTH CONTROL Hormonal birth control stops pregnancy by putting hormones into your body. Types of birth control include:  A small tube put under the skin of the upper arm (implant). The tube can stay in place for 3 years.   Shots given every 3 months.   Pills taken every day or once after sex (intercourse).   Patches that are changed once a week.   A ring put into the vagina (vaginal ring). The ring is left in place for 3 weeks and removed for 1 week. Then, a new ring is put in the vagina.  BARRIER BIRTH CONTROL  Barrier birth control blocks sperm from reaching the egg. Types of birth control include:   A thin covering worn on the penis (female condom) during sex.   A soft, loose covering put into the vagina (female condom) before sex.   A rubber bowl that sits over the cervix (diaphragm). The bowl must be made for you. The bowl is put into the vagina before sex. The bowl is left in place for 6 to 8 hours after sex.   A small, soft cup that fits over the cervix (cervical cap). The cup must be made for you. The cup can be left in place for 48 hours after sex.   A sponge that is put into the vagina before sex.   A chemical that kills or blocks sperm from getting into the cervix and uterus (spermicide). The chemical may be a cream, jelly, foam, or pill.    INTRAUTERINE (IUD) BIRTH CONTROL  IUD birth control is a small, T-shaped piece of plastic. The plastic is put inside the uterus. There are 2 types of IUD:  Copper IUD. The IUD is covered in copper wire. The copper makes a fluid that kills sperm. It can stay in place for 10 years.   Hormone IUD. The hormone stops pregnancy from happening. It can stay in place for 5 years.  NATURAL FAMILY PLANNING BIRTH CONTROL  Natural family planning means not having sex or using barrier birth control when the woman is fertile. A woman can:  Use a calendar to keep track of when she is fertile.   Use a thermometer to measure her body temperature.  Protect yourself against sexual diseases no matter what type of birth control you use. Talk to your doctor about which type of birth control is best for you. Document Released: 10/19/2008 Document Revised: 12/11/2010 Document Reviewed: 04/30/2010 [employer] Patient Information 2012 [employer].Breastfeeding BENEFITS OF BREASTFEEDING For the baby  The first milk (colostrum) helps the baby's digestive system function better.   There are antibodies from the mother in the milk that help the baby fight off infections.   The baby has a lower incidence of asthma, allergies, and SIDS (sudden infant death syndrome).   The nutrients in breast milk are better   than formulas for the baby and helps the baby's brain grow better.   Babies who breastfeed have less gas, colic, and constipation.  For the mother  Breastfeeding helps develop a very special bond between mother and baby.   It is more convenient, always available at the correct temperature and cheaper than formula feeding.   It burns calories in the mother and helps with losing weight that was gained during pregnancy.   It makes the uterus contract back down to normal size faster and slows bleeding following delivery.   Breastfeeding mothers have a lower risk of developing breast cancer.  NURSE  FREQUENTLY  A healthy, full-term baby may breastfeed as often as every hour or space his or her feedings to every 3 hours.   How often to nurse will vary from baby to baby. Watch your baby for signs of hunger, not the clock.   Nurse as often as the baby requests, or when you feel the need to reduce the fullness of your breasts.   Awaken the baby if it has been 3 to 4 hours since the last feeding.   Frequent feeding will help the mother make more milk and will prevent problems like sore nipples and engorgement of the breasts.  BABY'S POSITION AT THE BREAST  Whether lying down or sitting, be sure that the baby's tummy is facing your tummy.   Support the breast with 4 fingers underneath the breast and the thumb above. Make sure your fingers are well away from the nipple and baby's mouth.   Stroke the baby's lips and cheek closest to the breast gently with your finger or nipple.   When the baby's mouth is open wide enough, place all of your nipple and as much of the dark area around the nipple as possible into your baby's mouth.   Pull the baby in close so the tip of the nose and the baby's cheeks touch the breast during the feeding.  FEEDINGS  The length of each feeding varies from baby to baby and from feeding to feeding.   The baby must suck about 2 to 3 minutes for your milk to get to him or her. This is called a "let down." For this reason, allow the baby to feed on each breast as long as he or she wants. Your baby will end the feeding when he or she has received the right balance of nutrients.   To break the suction, put your finger into the corner of the baby's mouth and slide it between his or her gums before removing your breast from his or her mouth. This will help prevent sore nipples.  REDUCING BREAST ENGORGEMENT  In the first week after your baby is born, you may experience signs of breast engorgement. When breasts are engorged, they feel heavy, warm, full, and may be tender  to the touch. You can reduce engorgement if you:   Nurse frequently, every 2 to 3 hours. Mothers who breastfeed early and often have fewer problems with engorgement.   Place light ice packs on your breasts between feedings. This reduces swelling. Wrap the ice packs in a lightweight towel to protect your skin.   Apply moist hot packs to your breast for 5 to 10 minutes before each feeding. This increases circulation and helps the milk flow.   Gently massage your breast before and during the feeding.   Make sure that the baby empties at least one breast at every feeding before switching sides.     Use a breast pump to empty the breasts if your baby is sleepy or not nursing well. You may also want to pump if you are returning to work or or you feel you are getting engorged.   Avoid bottle feeds, pacifiers or supplemental feedings of water or juice in place of breastfeeding.   Be sure the baby is latched on and positioned properly while breastfeeding.   Prevent fatigue, stress, and anemia.   Wear a supportive bra, avoiding underwire styles.   Eat a balanced diet with enough fluids.  If you follow these suggestions, your engorgement should improve in 24 to 48 hours. If you are still experiencing difficulty, call your lactation consultant or caregiver. IS MY BABY GETTING ENOUGH MILK? Sometimes, mothers worry about whether their babies are getting enough milk. You can be assured that your baby is getting enough milk if:  The baby is actively sucking and you hear swallowing.   The baby nurses at least 8 to 12 times in a 24 hour time period. Nurse your baby until he or she unlatches or falls asleep at the first breast (at least 10 to 20 minutes), then offer the second side.   The baby is wetting 5 to 6 disposable diapers (6 to 8 cloth diapers) in a 24 hour period by 5 to 6 days of age.   The baby is having at least 2 to 3 stools every 24 hours for the first few months. Breast milk is all the  food your baby needs. It is not necessary for your baby to have water or formula. In fact, to help your breasts make more milk, it is best not to give your baby supplemental feedings during the early weeks.   The stool should be soft and yellow.   The baby should gain 4 to 7 ounces per week after he is 4 days old.  TAKE CARE OF YOURSELF Take care of your breasts by:  Bathing or showering daily.   Avoiding the use of soaps on your nipples.   Start feedings on your left breast at one feeding and on your right breast at the next feeding.   You will notice an increase in your milk supply 2 to 5 days after delivery. You may feel some discomfort from engorgement, which makes your breasts very firm and often tender. Engorgement "peaks" out within 24 to 48 hours. In the meantime, apply warm moist towels to your breasts for 5 to 10 minutes before feeding. Gentle massage and expression of some milk before feeding will soften your breasts, making it easier for your baby to latch on. Wear a well fitting nursing bra and air dry your nipples for 10 to 15 minutes after each feeding.   Only use cotton bra pads.   Only use pure lanolin on your nipples after nursing. You do not need to wash it off before nursing.  Take care of yourself by:   Eating well-balanced meals and nutritious snacks.   Drinking milk, fruit juice, and water to satisfy your thirst (about 8 glasses a day).   Getting plenty of rest.   Increasing calcium in your diet (1200 mg a day).   Avoiding foods that you notice affect the baby in a bad way.  SEEK MEDICAL CARE IF:   You have any questions or difficulty with breastfeeding.   You need help.   You have a hard, red, sore area on your breast, accompanied by a fever of 100.5 F (38.1 C) or more.     Your baby is too sleepy to eat well or is having trouble sleeping.   Your baby is wetting less than 6 diapers per day, by 5 days of age.   Your baby's skin or white part of his or  her eyes is more yellow than it was in the hospital.   You feel depressed.  Document Released: 12/22/2004 Document Revised: 12/11/2010 Document Reviewed: 08/06/2008 [employer] Patient Information 2012 [employer]. 

## 2011-05-10 LAB — CULTURE, OB URINE

## 2011-05-18 ENCOUNTER — Encounter: Payer: Self-pay | Admitting: *Deleted

## 2011-05-22 ENCOUNTER — Encounter: Payer: Self-pay | Admitting: *Deleted

## 2011-05-28 ENCOUNTER — Ambulatory Visit (INDEPENDENT_AMBULATORY_CARE_PROVIDER_SITE_OTHER): Payer: Medicaid Other | Admitting: Obstetrics and Gynecology

## 2011-05-28 ENCOUNTER — Encounter: Payer: Self-pay | Admitting: Obstetrics and Gynecology

## 2011-05-28 VITALS — BP 136/82 | Temp 97.5°F | Wt 235.6 lb

## 2011-05-28 DIAGNOSIS — I10 Essential (primary) hypertension: Secondary | ICD-10-CM

## 2011-05-28 DIAGNOSIS — O10019 Pre-existing essential hypertension complicating pregnancy, unspecified trimester: Secondary | ICD-10-CM

## 2011-05-28 LAB — POCT URINALYSIS DIP (DEVICE)
Bilirubin Urine: NEGATIVE
Nitrite: NEGATIVE
pH: 7.5 (ref 5.0–8.0)

## 2011-05-28 NOTE — Progress Notes (Signed)
P-83 

## 2011-05-28 NOTE — Progress Notes (Signed)
Patient doing well without complaints. Cont monitoring BP

## 2011-06-11 ENCOUNTER — Ambulatory Visit (INDEPENDENT_AMBULATORY_CARE_PROVIDER_SITE_OTHER): Payer: Medicaid Other | Admitting: Physician Assistant

## 2011-06-11 VITALS — BP 125/67 | Temp 97.8°F | Wt 234.5 lb

## 2011-06-11 DIAGNOSIS — O10019 Pre-existing essential hypertension complicating pregnancy, unspecified trimester: Secondary | ICD-10-CM

## 2011-06-11 LAB — POCT URINALYSIS DIP (DEVICE)
Glucose, UA: NEGATIVE mg/dL
Hgb urine dipstick: NEGATIVE
Nitrite: NEGATIVE
Urobilinogen, UA: 4 mg/dL — ABNORMAL HIGH (ref 0.0–1.0)
pH: 7 (ref 5.0–8.0)

## 2011-06-11 NOTE — Progress Notes (Signed)
No complaints. No s/s Pre-x. Growth [place] ordered. Precautions reviewed.

## 2011-06-11 NOTE — Patient Instructions (Signed)

## 2011-06-11 NOTE — Progress Notes (Signed)
P - 74 

## 2011-06-11 NOTE — Progress Notes (Signed)
U/S scheduled July 02, 2011 at 845 am.

## 2011-07-02 ENCOUNTER — Ambulatory Visit (INDEPENDENT_AMBULATORY_CARE_PROVIDER_SITE_OTHER): Payer: Medicaid Other | Admitting: Family

## 2011-07-02 ENCOUNTER — Ambulatory Visit (HOSPITAL_COMMUNITY)
Admission: RE | Admit: 2011-07-02 | Discharge: 2011-07-02 | Disposition: A | Discharge status: Home / Self Care | Payer: Medicaid Other | Source: Ambulatory Visit | Attending: Physician Assistant | Admitting: Physician Assistant

## 2011-07-02 VITALS — BP 125/78 | Temp 97.3°F | Wt 236.0 lb

## 2011-07-02 DIAGNOSIS — O0990 Supervision of high risk pregnancy, unspecified, unspecified trimester: Secondary | ICD-10-CM

## 2011-07-02 DIAGNOSIS — O10019 Pre-existing essential hypertension complicating pregnancy, unspecified trimester: Secondary | ICD-10-CM

## 2011-07-02 DIAGNOSIS — E669 Obesity, unspecified: Secondary | ICD-10-CM | POA: Insufficient documentation

## 2011-07-02 LAB — CBC
MCH: 31 pg (ref 26.0–34.0)
MCHC: 33.9 g/dL (ref 30.0–36.0)
Platelets: 254 10*3/uL (ref 150–400)

## 2011-07-02 LAB — POCT URINALYSIS DIP (DEVICE)
Nitrite: NEGATIVE
Protein, ur: NEGATIVE mg/dL
Urobilinogen, UA: 2 mg/dL — ABNORMAL HIGH (ref 0.0–1.0)
pH: 7 (ref 5.0–8.0)

## 2011-07-02 NOTE — Progress Notes (Signed)
No questions or concerns; [place] today with growth at 45%; 1 hr/CBC/RPR/HIV completed

## 2011-07-02 NOTE — Addendum Note (Signed)
Addended by: Melissa Noon on: 07/02/2011 11:43 AM   Modules accepted: Orders

## 2011-07-02 NOTE — Progress Notes (Signed)
Pulse 68 Needs 1 hr @ 1120 ,CBC, RPR, HIV

## 2011-07-03 LAB — RPR

## 2011-07-07 ENCOUNTER — Encounter: Payer: Self-pay | Admitting: Family

## 2011-07-16 ENCOUNTER — Encounter: Payer: Self-pay | Admitting: Physician Assistant

## 2011-07-16 ENCOUNTER — Ambulatory Visit (INDEPENDENT_AMBULATORY_CARE_PROVIDER_SITE_OTHER): Payer: Medicaid Other | Admitting: Physician Assistant

## 2011-07-16 VITALS — BP 125/67 | Temp 97.2°F | Wt 236.6 lb

## 2011-07-16 DIAGNOSIS — O10019 Pre-existing essential hypertension complicating pregnancy, unspecified trimester: Secondary | ICD-10-CM

## 2011-07-16 LAB — POCT URINALYSIS DIP (DEVICE)
Nitrite: NEGATIVE
Protein, ur: NEGATIVE mg/dL
pH: 6 (ref 5.0–8.0)

## 2011-07-16 NOTE — Progress Notes (Signed)
No complaints. No s/s pre-x.

## 2011-07-16 NOTE — Patient Instructions (Addendum)

## 2011-07-16 NOTE — Progress Notes (Signed)
P= 86  

## 2011-07-17 ENCOUNTER — Inpatient Hospital Stay (HOSPITAL_COMMUNITY)
Admission: AD | Admit: 2011-07-17 | Discharge: 2011-07-17 | Disposition: A | Discharge status: Home / Self Care | Payer: Medicaid Other | Source: Ambulatory Visit | Attending: Obstetrics & Gynecology | Admitting: Obstetrics & Gynecology

## 2011-07-17 ENCOUNTER — Encounter (HOSPITAL_COMMUNITY): Payer: Self-pay | Admitting: *Deleted

## 2011-07-17 DIAGNOSIS — O10019 Pre-existing essential hypertension complicating pregnancy, unspecified trimester: Secondary | ICD-10-CM

## 2011-07-17 DIAGNOSIS — O0990 Supervision of high risk pregnancy, unspecified, unspecified trimester: Secondary | ICD-10-CM

## 2011-07-17 DIAGNOSIS — A5901 Trichomonal vulvovaginitis: Secondary | ICD-10-CM

## 2011-07-17 DIAGNOSIS — O99891 Other specified diseases and conditions complicating pregnancy: Secondary | ICD-10-CM | POA: Insufficient documentation

## 2011-07-17 DIAGNOSIS — A599 Trichomoniasis, unspecified: Secondary | ICD-10-CM

## 2011-07-17 DIAGNOSIS — N949 Unspecified condition associated with female genital organs and menstrual cycle: Secondary | ICD-10-CM | POA: Insufficient documentation

## 2011-07-17 LAB — URINALYSIS, ROUTINE W REFLEX MICROSCOPIC
Bilirubin Urine: NEGATIVE
Ketones, ur: NEGATIVE mg/dL
Nitrite: NEGATIVE
Urobilinogen, UA: 1 mg/dL (ref 0.0–1.0)
pH: 6.5 (ref 5.0–8.0)

## 2011-07-17 LAB — URINE MICROSCOPIC-ADD ON

## 2011-07-17 MED ORDER — METRONIDAZOLE 500 MG PO TABS: 500.0000 mg | ORAL_TABLET | Freq: Two times a day (BID) | ORAL | Status: DC

## 2011-07-17 NOTE — MAU Note (Signed)
Pt states noted mucus like discharge with pink/dark brown vaginal discharge. States had "little" pain last pm, some this am. No recent intercourse.

## 2011-07-17 NOTE — MAU Provider Note (Signed)
  History     CSN: [id number]  Arrival date and time: 07/17/11 1308   None     Chief Complaint  Patient presents with  . Pink tissue, ? mucus plug    HPI Patient is a G2P1001 at 29.1 EGA by LMP who presents following pink mucusy discharge on a tissue this morning.  She states her mom felt this looked like her sisters mucus plug.  She states it looked like pinkish-reddish snot.  She denies loss of fluid and any large amount of blood.  She states she had intermittent contractions last night and this morning, but has not had any in a while.  She denies any pain. She denies any recent intercourse or vaginal exams.  She has not been sexually active since the first month of pregnancy.  She states she was previously treated for trichomonas earlier in her pregnancy.  OB History    Grav Para Term Preterm Abortions TAB SAB Ect Mult Living   2 1 1       1      [facility].  Past Medical History  Diagnosis Date  . Hypertension   . Obesity   . Benign essential hypertension antepartum 02/11/2011    Past Surgical History  Procedure Date  . Laparoscopic cholecystectomy w/ cholangiography 06/10/10    Dr Gaynelle Adu    Family History  Problem Relation Age of Onset  . Hypertension Sister   . Lupus Sister   . Diabetes Sister   . Cancer Maternal Grandmother     GI  . Cancer Maternal Aunt     Breast  . Cancer Maternal Uncle     colon    History  Substance Use Topics  . Smoking status: Never Smoker   . Smokeless tobacco: Never Used  . Alcohol Use: No    Allergies: No Known Allergies  Prescriptions prior to admission  Medication Sig Dispense Refill  . Prenatal Vit-Fe Fumarate-FA (PRENATAL MULTIVITAMIN) TABS Take 1 tablet by mouth at bedtime.        ROS negative except per HPI Physical Exam   Last menstrual period 12/25/2010, not currently breastfeeding.  Physical Exam  Constitutional: She appears well-developed and well-nourished.  Cardiovascular:  Normal rate, regular rhythm and normal heart sounds.   Respiratory: Effort normal and breath sounds normal.  GI: Soft. Bowel sounds are normal. There is no tenderness.       Gravid abdomen  Genitourinary:       Cervix: closed/thick posterior cervix  Musculoskeletal: She exhibits no edema.   U/A: large number of leukocytes, otherwise normal Microscopic urine: trichomonas visualized MAU Course  Procedures  Assessment and Plan  Patient is a G2P1001 at 29.1 EGA by LMP who presented for pink discharge.  Given trichomonas on microscopic urine and previous failure of treatment we will send her home with a 7 day course of flagyl. Discharge patient from MAU. She is to keep her follow-up appointment in 2 weeks.  Marikay Alar 07/17/2011, 9:51 AM   I have seen/examined this patient and agree with above. Maira Christon E.

## 2011-07-20 NOTE — MAU Provider Note (Signed)
Medical Screening exam and patient care preformed by advanced practice provider.  Agree with the above management.  

## 2011-07-24 ENCOUNTER — Telehealth: Payer: Self-pay | Admitting: Obstetrics and Gynecology

## 2011-07-24 DIAGNOSIS — B373 Candidiasis of vulva and vagina: Secondary | ICD-10-CM

## 2011-07-24 MED ORDER — FLUCONAZOLE 150 MG PO TABS: 150.0000 mg | ORAL_TABLET | Freq: Once | ORAL | Status: AC

## 2011-07-24 NOTE — Telephone Encounter (Signed)
Patient called with c/o yelowish vaginal d/c with itch. States she just finished Flagyl to treat Trich from MAU in 07/17/11. Per OB protocol sheet, ordered Diflucan 150mg  1 dose. Patient agrees and satisfied.

## 2011-07-30 ENCOUNTER — Ambulatory Visit (INDEPENDENT_AMBULATORY_CARE_PROVIDER_SITE_OTHER): Payer: Medicaid Other | Admitting: Family

## 2011-07-30 VITALS — BP 124/68 | Temp 98.5°F | Wt 235.5 lb

## 2011-07-30 DIAGNOSIS — A599 Trichomoniasis, unspecified: Secondary | ICD-10-CM

## 2011-07-30 DIAGNOSIS — R300 Dysuria: Secondary | ICD-10-CM

## 2011-07-30 DIAGNOSIS — O10019 Pre-existing essential hypertension complicating pregnancy, unspecified trimester: Secondary | ICD-10-CM

## 2011-07-30 DIAGNOSIS — I10 Essential (primary) hypertension: Secondary | ICD-10-CM

## 2011-07-30 DIAGNOSIS — R87811 Vaginal high risk human papillomavirus (HPV) DNA test positive: Secondary | ICD-10-CM

## 2011-07-30 DIAGNOSIS — O9989 Other specified diseases and conditions complicating pregnancy, childbirth and the puerperium: Secondary | ICD-10-CM

## 2011-07-30 DIAGNOSIS — IMO0002 Reserved for concepts with insufficient information to code with codable children: Secondary | ICD-10-CM

## 2011-07-30 LAB — POCT URINALYSIS DIP (DEVICE)
Bilirubin Urine: NEGATIVE
Glucose, UA: NEGATIVE mg/dL
Hgb urine dipstick: NEGATIVE
Nitrite: NEGATIVE

## 2011-07-30 MED ORDER — NITROFURANTOIN MONOHYD MACRO 100 MG PO CAPS: 100.0000 mg | ORAL_CAPSULE | Freq: Two times a day (BID) | ORAL | Status: AC

## 2011-07-30 NOTE — Progress Notes (Signed)
P=72, Patient states was treated for trich  Earlier in pregnancy, then on 7/12 had pink vaginal discharge, went to MAU, and they told her had trich + still and treated her again- has completed the medicine. Wants to know if she will be tested again to see if is gone.

## 2011-07-30 NOTE — Progress Notes (Signed)
Pt finished flagyl treatment for trichomonas, subsequently had yeast symptoms and diflucan was called in. Denies current vaginal discharge, irritation, or itching. Reports urinary frequency and suprapubic pain with urination most times she urinates - starting 2 days ago. Denies hematuria, fever, nausea, vomiting. Feels well. No additional complaints. [facility] PA-S.  RX Macrobid, urine culture sent.  Growth [place] at 34 wks; begin NST next week.

## 2011-08-04 LAB — CULTURE, OB URINE: Colony Count: >=100000

## 2011-08-05 ENCOUNTER — Other Ambulatory Visit: Payer: Self-pay | Admitting: Family

## 2011-08-05 DIAGNOSIS — O0990 Supervision of high risk pregnancy, unspecified, unspecified trimester: Secondary | ICD-10-CM

## 2011-08-05 MED ORDER — AMPICILLIN 500 MG PO CAPS: 500.0000 mg | ORAL_CAPSULE | Freq: Four times a day (QID) | ORAL | Status: AC

## 2011-08-06 ENCOUNTER — Ambulatory Visit (INDEPENDENT_AMBULATORY_CARE_PROVIDER_SITE_OTHER): Payer: Medicaid Other | Admitting: Obstetrics & Gynecology

## 2011-08-06 ENCOUNTER — Telehealth: Payer: Self-pay | Admitting: *Deleted

## 2011-08-06 ENCOUNTER — Ambulatory Visit (HOSPITAL_COMMUNITY)
Admission: RE | Admit: 2011-08-06 | Discharge: 2011-08-06 | Disposition: A | Discharge status: Home / Self Care | Payer: Medicaid Other | Source: Ambulatory Visit | Attending: Obstetrics & Gynecology | Admitting: Obstetrics & Gynecology

## 2011-08-06 VITALS — BP 133/73 | Temp 97.0°F | Wt 236.5 lb

## 2011-08-06 DIAGNOSIS — O10019 Pre-existing essential hypertension complicating pregnancy, unspecified trimester: Secondary | ICD-10-CM | POA: Insufficient documentation

## 2011-08-06 DIAGNOSIS — O0990 Supervision of high risk pregnancy, unspecified, unspecified trimester: Secondary | ICD-10-CM

## 2011-08-06 DIAGNOSIS — E669 Obesity, unspecified: Secondary | ICD-10-CM | POA: Insufficient documentation

## 2011-08-06 LAB — POCT URINALYSIS DIP (DEVICE)
Ketones, ur: NEGATIVE mg/dL
Nitrite: NEGATIVE
Protein, ur: NEGATIVE mg/dL
Urobilinogen, UA: 2 mg/dL — ABNORMAL HIGH (ref 0.0–1.0)

## 2011-08-06 NOTE — Telephone Encounter (Signed)
Message copied by Mannie Stabile on Thu Aug 06, 2011  3:11 PM ------      Message from: Melissa Noon      Created: Wed Aug 05, 2011  4:39 PM      Regarding: Please call and notify RX at Pharmacy       Please let pt know RX is at pharmacy for UTI.                  ----- Message -----         From: [facility]         Sent: 08/01/2011  12:38 AM           To: ZOXWRUE Paul Half, CNM

## 2011-08-06 NOTE — Progress Notes (Signed)
BPP today at 1045 am scheduled.U/s department advised to call results to the attending and have patient wait until MD notified.

## 2011-08-06 NOTE — Progress Notes (Signed)
Good fetal movement. NST NR today with some variables. To BPP

## 2011-08-06 NOTE — Patient Instructions (Addendum)
Fetal Monitoring, Biophysical Profile This test measures and evaluates 5 observations of the baby. These include the nonstress test, the baby's breathing, movements, muscle tone and the amount of amniotic fluid. The reason to monitor your baby (fetus) before birth is to identify and correct problems that may prevent serious problems from developing with the fetus, including fetal loss. Some pregnancies are complicated by the mother's medical problems. Some of these problems are type 1 diabetes mellitus, high blood pressure and other chronic medical illnesses. This is why it is important to monitor the baby before birth.  OTHER TECHNIQUES FOR MONITORING YOUR BABY BEFORE BIRTH:  Several fetal observation tests are in use. These include:   Fetal movement assessment (FMA). This is done by the pregnant woman herself by counting and recording the baby's movements over a certain time period.   Nonstress test (NST). This test monitors the baby's heart rate when the baby moves.   Contraction stress test (CST). This test monitors the baby's heart rate during a contraction of the uterus.   Modified BPP. This measures the volume of fluid in different parts of the amniotic sac (amniotic fluid index) and the results of the nonstress test.   Umbilical artery doppler velocimetry. This evaluates the blood flow through the umbilical cord.  There are several very serious problems that cannot be predicted or detected with any of the fetal monitoring procedures. These problems include separation (abruption) of the placenta or when the fetus chokes on the umbilical cord (umbilical cord accident). Your caregiver will help you understand the tests and what they mean for you and your baby. It is your responsibility to obtain your test results. LET YOUR CAREGIVER KNOW ABOUT:   Any medications you are taking including prescription and over-the-counter drugs, herbs, eye drops and creams.   If you have a fever.   If you  have an infection.   If you are sick.  RISKS AND COMPLICATIONS  There are no risks or complications to the mother or the fetus. BEFORE THE PROCEDURE   Do not take medications that may increase or decrease the baby's heart rate and/or movements.   Eat a full meal at least 2 hours before the test.   Do not smoke if you are pregnant. If you smoke, stop at least 2 days before the test. It is best not to smoke at all when you are pregnant.  PROCEDURE The BPP contains five parts:  Nonstress test.   Fetal breathing movements.   Fetal movement.   Fetal tone (extension and flexion of the extremities).   Measuring of the amniotic fluid volume.  Each of the five test parts is assigned a score of either 2 (normal) or 0 (abnormal). A combined score of 8 or 10 is normal, a score of 6 could go either way, and a score of 4 or less is abnormal. No matter what the final score of the BPP is, if the amniotic fluid volume is 2 centimeters or less, further studies and evaluation of the baby should take place. AFTER THE PROCEDURE  You may go home and resume your usual activities, or as directed by your caregiver. HOME CARE INSTRUCTIONS   Follow your caregiver's advice and recommendations.   Be aware of your baby's movements. Are they normal, less than usual or more than usual?   Make and keep the rest of your prenatal appointments.  SEEK MEDICAL CARE IF:   You develop a temperature of 100 F (37.8 C) or higher.  You have a bloody mucus discharge (bloody show).  SEEK IMMEDIATE MEDICAL CARE IF:  You do not feel the baby moving.   You think the baby's movements have been less than usual or too many.   You develop uterine contractions.   You develop vaginal bleeding.   You develop abdominal pain.   You have leaking or a gush of fluid from the vagina.  Document Released: 12/12/2001 Document Revised: 12/11/2010 Document Reviewed: 10/17/2007 [facility] Patient Information 2012 [place],  [place].

## 2011-08-06 NOTE — Progress Notes (Signed)
Pulse: 84

## 2011-08-06 NOTE — Telephone Encounter (Signed)
Pt informed of uti and rx available at pharmacy.

## 2011-08-10 ENCOUNTER — Ambulatory Visit (INDEPENDENT_AMBULATORY_CARE_PROVIDER_SITE_OTHER): Payer: Medicaid Other | Admitting: *Deleted

## 2011-08-10 VITALS — BP 134/66 | Wt 237.7 lb

## 2011-08-10 DIAGNOSIS — O10019 Pre-existing essential hypertension complicating pregnancy, unspecified trimester: Secondary | ICD-10-CM

## 2011-08-10 NOTE — Progress Notes (Signed)
P-71 

## 2011-08-13 ENCOUNTER — Encounter: Payer: Self-pay | Admitting: Obstetrics and Gynecology

## 2011-08-13 ENCOUNTER — Ambulatory Visit (INDEPENDENT_AMBULATORY_CARE_PROVIDER_SITE_OTHER): Payer: Medicaid Other | Admitting: Obstetrics and Gynecology

## 2011-08-13 VITALS — BP 142/84 | Temp 97.6°F | Wt 237.2 lb

## 2011-08-13 DIAGNOSIS — R87811 Vaginal high risk human papillomavirus (HPV) DNA test positive: Secondary | ICD-10-CM

## 2011-08-13 DIAGNOSIS — O10019 Pre-existing essential hypertension complicating pregnancy, unspecified trimester: Secondary | ICD-10-CM

## 2011-08-13 DIAGNOSIS — O0990 Supervision of high risk pregnancy, unspecified, unspecified trimester: Secondary | ICD-10-CM

## 2011-08-13 DIAGNOSIS — I10 Essential (primary) hypertension: Secondary | ICD-10-CM

## 2011-08-13 DIAGNOSIS — IMO0002 Reserved for concepts with insufficient information to code with codable children: Secondary | ICD-10-CM

## 2011-08-13 LAB — POCT URINALYSIS DIP (DEVICE)
Glucose, UA: NEGATIVE mg/dL
Leukocytes, UA: NEGATIVE
Nitrite: NEGATIVE
Urobilinogen, UA: 2 mg/dL — ABNORMAL HIGH (ref 0.0–1.0)

## 2011-08-13 NOTE — Progress Notes (Signed)
U/S scheduled August 20, 2011 at 945 am.

## 2011-08-13 NOTE — Progress Notes (Signed)
NST reviewed and category I. FM/PTL precautions reviewed. Monitor BP. Patient asymptomatic for preclampsia. Si/Sx reviewed. Will schedule f/u growth ultrasound for next week

## 2011-08-13 NOTE — Progress Notes (Signed)
Pulse: 72

## 2011-08-17 ENCOUNTER — Ambulatory Visit (INDEPENDENT_AMBULATORY_CARE_PROVIDER_SITE_OTHER): Payer: Medicaid Other | Admitting: *Deleted

## 2011-08-17 ENCOUNTER — Ambulatory Visit (HOSPITAL_COMMUNITY)
Admission: RE | Admit: 2011-08-17 | Discharge: 2011-08-17 | Disposition: A | Discharge status: Home / Self Care | Payer: Medicaid Other | Source: Ambulatory Visit | Attending: Obstetrics and Gynecology | Admitting: Obstetrics and Gynecology

## 2011-08-17 VITALS — BP 129/71 | Wt 237.8 lb

## 2011-08-17 DIAGNOSIS — O10019 Pre-existing essential hypertension complicating pregnancy, unspecified trimester: Secondary | ICD-10-CM | POA: Insufficient documentation

## 2011-08-17 DIAGNOSIS — E669 Obesity, unspecified: Secondary | ICD-10-CM | POA: Insufficient documentation

## 2011-08-17 DIAGNOSIS — O99210 Obesity complicating pregnancy, unspecified trimester: Secondary | ICD-10-CM | POA: Insufficient documentation

## 2011-08-17 NOTE — Progress Notes (Signed)
P = 93   NST tracing reviewed by Dr. Jolayne Panther- pt sent to [place] dept. for BPP.

## 2011-08-20 ENCOUNTER — Ambulatory Visit (HOSPITAL_COMMUNITY): Admission: RE | Admit: 2011-08-20 | Payer: Medicaid Other | Source: Ambulatory Visit

## 2011-08-20 ENCOUNTER — Encounter (HOSPITAL_COMMUNITY): Payer: Self-pay

## 2011-08-20 ENCOUNTER — Other Ambulatory Visit: Payer: Self-pay | Admitting: Obstetrics and Gynecology

## 2011-08-20 ENCOUNTER — Ambulatory Visit (HOSPITAL_COMMUNITY)
Admission: RE | Admit: 2011-08-20 | Discharge: 2011-08-20 | Disposition: A | Discharge status: Home / Self Care | Payer: Medicaid Other | Source: Ambulatory Visit | Attending: Obstetrics and Gynecology | Admitting: Obstetrics and Gynecology

## 2011-08-20 ENCOUNTER — Ambulatory Visit (INDEPENDENT_AMBULATORY_CARE_PROVIDER_SITE_OTHER): Payer: Medicaid Other | Admitting: Obstetrics and Gynecology

## 2011-08-20 VITALS — BP 130/70 | HR 57 | Wt 237.0 lb

## 2011-08-20 VITALS — BP 129/73 | Temp 96.8°F | Wt 237.0 lb

## 2011-08-20 DIAGNOSIS — O2690 Pregnancy related conditions, unspecified, unspecified trimester: Secondary | ICD-10-CM

## 2011-08-20 DIAGNOSIS — J90 Pleural effusion, not elsewhere classified: Secondary | ICD-10-CM

## 2011-08-20 DIAGNOSIS — IMO0002 Reserved for concepts with insufficient information to code with codable children: Secondary | ICD-10-CM

## 2011-08-20 DIAGNOSIS — O10019 Pre-existing essential hypertension complicating pregnancy, unspecified trimester: Secondary | ICD-10-CM

## 2011-08-20 DIAGNOSIS — R87811 Vaginal high risk human papillomavirus (HPV) DNA test positive: Secondary | ICD-10-CM

## 2011-08-20 DIAGNOSIS — O0990 Supervision of high risk pregnancy, unspecified, unspecified trimester: Secondary | ICD-10-CM

## 2011-08-20 DIAGNOSIS — I10 Essential (primary) hypertension: Secondary | ICD-10-CM

## 2011-08-20 DIAGNOSIS — E669 Obesity, unspecified: Secondary | ICD-10-CM | POA: Insufficient documentation

## 2011-08-20 LAB — POCT URINALYSIS DIP (DEVICE)
Nitrite: NEGATIVE
Protein, ur: NEGATIVE mg/dL
pH: 6.5 (ref 5.0–8.0)

## 2011-08-20 NOTE — Progress Notes (Signed)
Patient doing well without complaints. FM/PTL precautions reviewed. Explained results of ultrasound with small pericardial effusion. Referral to MFM made. Case discussed with Dr. Otho Perl from MFM who agreed to see patient today at 3:15pm. NST reviewed and reactive

## 2011-08-20 NOTE — Progress Notes (Signed)
Pulse: 67

## 2011-08-20 NOTE — Progress Notes (Incomplete)
NST reviewed and category II. BPP 8/8

## 2011-08-20 NOTE — Progress Notes (Signed)
Carol Blair was seen for ultrasound appointment today.  Please see [facility] report for details.  

## 2011-08-21 ENCOUNTER — Other Ambulatory Visit: Payer: Self-pay | Admitting: Obstetrics and Gynecology

## 2011-08-21 DIAGNOSIS — I10 Essential (primary) hypertension: Secondary | ICD-10-CM

## 2011-08-24 ENCOUNTER — Encounter: Payer: Medicaid Other | Admitting: *Deleted

## 2011-08-24 ENCOUNTER — Ambulatory Visit (HOSPITAL_COMMUNITY)
Admission: RE | Admit: 2011-08-24 | Discharge: 2011-08-24 | Disposition: A | Discharge status: Home / Self Care | Payer: Medicaid Other | Source: Ambulatory Visit | Attending: Obstetrics and Gynecology | Admitting: Obstetrics and Gynecology

## 2011-08-24 VITALS — BP 128/71 | HR 80 | Wt 240.0 lb

## 2011-08-24 DIAGNOSIS — O10019 Pre-existing essential hypertension complicating pregnancy, unspecified trimester: Secondary | ICD-10-CM | POA: Insufficient documentation

## 2011-08-24 DIAGNOSIS — I10 Essential (primary) hypertension: Secondary | ICD-10-CM

## 2011-08-24 DIAGNOSIS — E669 Obesity, unspecified: Secondary | ICD-10-CM | POA: Insufficient documentation

## 2011-08-24 DIAGNOSIS — O0990 Supervision of high risk pregnancy, unspecified, unspecified trimester: Secondary | ICD-10-CM

## 2011-08-24 DIAGNOSIS — IMO0002 Reserved for concepts with insufficient information to code with codable children: Secondary | ICD-10-CM

## 2011-08-24 DIAGNOSIS — O358XX0 Maternal care for other (suspected) fetal abnormality and damage, not applicable or unspecified: Secondary | ICD-10-CM | POA: Insufficient documentation

## 2011-08-24 NOTE — Progress Notes (Signed)
Carol Blair  was seen today for an ultrasound appointment.  See full report in [facility].  Alpha Gula, MD  Single IUP at 34 4/7 weeks Pericardial effusion - 6 mm (somewhat more generous than typically seen) No other signes of evolving hydrops Active fetus with BPP of 8/8 Normal amniotic fluid volume  Weekly BPPs Fetal echo with peds cardiology (scheduled)

## 2011-08-25 NOTE — Progress Notes (Signed)
NST 08/10/11 reactive 

## 2011-08-27 ENCOUNTER — Other Ambulatory Visit: Payer: Self-pay

## 2011-08-27 ENCOUNTER — Ambulatory Visit (INDEPENDENT_AMBULATORY_CARE_PROVIDER_SITE_OTHER): Payer: Medicaid Other | Admitting: Obstetrics and Gynecology

## 2011-08-27 ENCOUNTER — Other Ambulatory Visit: Payer: Medicaid Other

## 2011-08-27 VITALS — BP 133/87 | Temp 97.2°F | Wt 238.6 lb

## 2011-08-27 DIAGNOSIS — O10019 Pre-existing essential hypertension complicating pregnancy, unspecified trimester: Secondary | ICD-10-CM

## 2011-08-27 DIAGNOSIS — O0990 Supervision of high risk pregnancy, unspecified, unspecified trimester: Secondary | ICD-10-CM

## 2011-08-27 DIAGNOSIS — J90 Pleural effusion, not elsewhere classified: Secondary | ICD-10-CM

## 2011-08-27 DIAGNOSIS — O359XX0 Maternal care for (suspected) fetal abnormality and damage, unspecified, not applicable or unspecified: Secondary | ICD-10-CM | POA: Insufficient documentation

## 2011-08-27 DIAGNOSIS — I10 Essential (primary) hypertension: Secondary | ICD-10-CM

## 2011-08-27 DIAGNOSIS — IMO0002 Reserved for concepts with insufficient information to code with codable children: Secondary | ICD-10-CM

## 2011-08-27 LAB — POCT URINALYSIS DIP (DEVICE)
Glucose, UA: NEGATIVE mg/dL
Ketones, ur: 15 mg/dL — AB
Protein, ur: NEGATIVE mg/dL
Specific Gravity, Urine: 1.01 (ref 1.005–1.030)
Urobilinogen, UA: 0.2 mg/dL (ref 0.0–1.0)

## 2011-08-27 NOTE — Progress Notes (Signed)
NST reviewed and reactive. Patient doing well without complaints. FM/PTL precautions reviewed. F/U ultrasound with MFM next week. Awaiting fetal echo report

## 2011-08-27 NOTE — Progress Notes (Signed)
P - 60  

## 2011-08-31 ENCOUNTER — Ambulatory Visit (HOSPITAL_COMMUNITY)
Admission: RE | Admit: 2011-08-31 | Discharge: 2011-08-31 | Disposition: A | Discharge status: Home / Self Care | Payer: Medicaid Other | Source: Ambulatory Visit | Attending: Obstetrics and Gynecology | Admitting: Obstetrics and Gynecology

## 2011-08-31 VITALS — BP 133/86 | HR 70 | Wt 240.0 lb

## 2011-08-31 DIAGNOSIS — IMO0002 Reserved for concepts with insufficient information to code with codable children: Secondary | ICD-10-CM

## 2011-08-31 DIAGNOSIS — O10019 Pre-existing essential hypertension complicating pregnancy, unspecified trimester: Secondary | ICD-10-CM | POA: Insufficient documentation

## 2011-08-31 DIAGNOSIS — O0990 Supervision of high risk pregnancy, unspecified, unspecified trimester: Secondary | ICD-10-CM

## 2011-08-31 DIAGNOSIS — O358XX0 Maternal care for other (suspected) fetal abnormality and damage, not applicable or unspecified: Secondary | ICD-10-CM | POA: Insufficient documentation

## 2011-08-31 NOTE — Progress Notes (Signed)
Carol Blair  was seen today for an ultrasound appointment.  See full report in [facility].  Alpha Gula, MD  Single IUP at 35 4/7 weeks Pericardial effusion - s/p normal fetal echo- stable in size Active fetus with BPP of 8/8 Normal amniotic fluid volume No signs of evolving hydrops  Continue weekly BPPs.

## 2011-09-01 ENCOUNTER — Inpatient Hospital Stay (HOSPITAL_COMMUNITY)
Admission: AD | Admit: 2011-09-01 | Discharge: 2011-09-01 | Disposition: A | Discharge status: Home / Self Care | Payer: Medicaid Other | Source: Ambulatory Visit | Attending: Obstetrics & Gynecology | Admitting: Obstetrics & Gynecology

## 2011-09-01 ENCOUNTER — Encounter (HOSPITAL_COMMUNITY): Payer: Self-pay | Admitting: *Deleted

## 2011-09-01 DIAGNOSIS — N898 Other specified noninflammatory disorders of vagina: Secondary | ICD-10-CM

## 2011-09-01 DIAGNOSIS — R300 Dysuria: Secondary | ICD-10-CM | POA: Insufficient documentation

## 2011-09-01 DIAGNOSIS — O99891 Other specified diseases and conditions complicating pregnancy: Secondary | ICD-10-CM | POA: Insufficient documentation

## 2011-09-01 DIAGNOSIS — N949 Unspecified condition associated with female genital organs and menstrual cycle: Secondary | ICD-10-CM | POA: Insufficient documentation

## 2011-09-01 DIAGNOSIS — N9089 Other specified noninflammatory disorders of vulva and perineum: Secondary | ICD-10-CM

## 2011-09-01 LAB — URINALYSIS, ROUTINE W REFLEX MICROSCOPIC
Glucose, UA: NEGATIVE mg/dL
Ketones, ur: 15 mg/dL — AB
Leukocytes, UA: NEGATIVE
Nitrite: NEGATIVE
Protein, ur: NEGATIVE mg/dL
Urobilinogen, UA: 2 mg/dL — ABNORMAL HIGH (ref 0.0–1.0)

## 2011-09-01 LAB — WET PREP, GENITAL: Yeast Wet Prep HPF POC: NONE SEEN

## 2011-09-01 NOTE — MAU Note (Signed)
Pt C/O vaginal swelling since yesterday, some dysuria also yesterday, none today.  Pt denies uc's, bleeding, LOF.  Does have thin white discharge this a.m.

## 2011-09-01 NOTE — MAU Note (Signed)
Patient states she is having vaginal swelling and irritation with a milky discharge. Denies any contractions and reports good fetal movement.

## 2011-09-01 NOTE — [facility] Provider Note (Signed)
Chief Complaint:  Vaginal Discharge   First Provider Initiated Contact with Patient 09/01/11 1138     HPI: Carol Blair is a 21 y.o. G2P1001 at 25w5dwho presents to maternity admissions reporting vulvar edema and thin, white discharge w/out odor.  Denies contractions, leakage of fluid, vaginal bleeding, vulvar pruritis or pain, fever, chills. Good fetal movement.   Past Medical History: Past Medical History  Diagnosis Date  . Hypertension   . Obesity   . Benign essential hypertension antepartum 02/11/2011    Past obstetric history: OB History    Grav Para Term Preterm Abortions TAB SAB Ect Mult Living   2 1 1       1      # Outc Date GA Lbr Len/2nd Wgt Sex Del Anes PTL Lv   1 TRM 4/12 [redacted]w[redacted]d  6lb1.9oz(2.776kg) F SVD EPI  Yes   Comments: System Generated. Please review and update pregnancy details.   2 CUR               Past Surgical History: Past Surgical History  Procedure Date  . Laparoscopic cholecystectomy w/ cholangiography 06/10/10    Dr Gaynelle Adu    Family History: Family History  Problem Relation Age of Onset  . Hypertension Sister   . Lupus Sister   . Diabetes Sister   . Cancer Maternal Grandmother     GI  . Cancer Maternal Aunt     Breast  . Cancer Maternal Uncle     colon    Social History: History  Substance Use Topics  . Smoking status: Never Smoker   . Smokeless tobacco: Never Used  . Alcohol Use: No    Allergies: No Known Allergies  Meds:  Prescriptions prior to admission  Medication Sig Dispense Refill  . calcium carbonate (TUMS - DOSED IN MG ELEMENTAL CALCIUM) 500 MG chewable tablet Chew 3 tablets by mouth daily as needed. For heartburn      . Prenatal Vit-Fe Fumarate-FA (PRENATAL MULTIVITAMIN) TABS Take 1 tablet by mouth daily.         ROS: Pertinent findings in history of present illness.  Physical Exam  Blood pressure 141/78, pulse 61, temperature 97.2 F (36.2 C), temperature source Oral, resp. rate 18, height 5\' 5"  (1.651 m),  weight 108.047 kg (238 lb 3.2 oz), last menstrual period 12/25/2010. GENERAL: Well-developed, well-nourished female in no acute distress.  HEENT: normocephalic HEART: normal rate RESP: normal effort ABDOMEN: Soft, non-tender, gravid appropriate for gestational age EXTREMITIES: Nontender, no edema NEURO: alert and oriented SPECULUM EXAM: NEFG, mildy engorged vulva w/out lesions or erythema. physiologic discharge, no blood, cervix clean FHT:  Baseline 130 , moderate variability, accelerations present, no decelerations Contractions: UI   Labs: Results for orders placed during the hospital encounter of 09/01/11 (from the past 72 hour(s))  URINALYSIS, ROUTINE W REFLEX MICROSCOPIC     Status: Abnormal   Collection Time   09/01/11 10:37 AM      Component Value Range Comment   Color, Urine YELLOW  YELLOW    APPearance CLEAR  CLEAR    Specific Gravity, Urine 1.025  1.005 - 1.030    pH 6.0  5.0 - 8.0    Glucose, UA NEGATIVE  NEGATIVE mg/dL    Hgb urine dipstick NEGATIVE  NEGATIVE    Bilirubin Urine NEGATIVE  NEGATIVE    Ketones, ur 15 (*) NEGATIVE mg/dL    Protein, ur NEGATIVE  NEGATIVE mg/dL    Urobilinogen, UA 2.0 (*) 0.0 - 1.0 mg/dL  Nitrite NEGATIVE  NEGATIVE    Leukocytes, UA NEGATIVE  NEGATIVE MICROSCOPIC NOT DONE ON URINES WITH NEGATIVE PROTEIN, BLOOD, LEUKOCYTES, NITRITE, OR GLUCOSE <1000 mg/dL.  WET PREP, GENITAL     Status: Abnormal   Collection Time   09/01/11 11:48 AM      Component Value Range Comment   Yeast Wet Prep HPF POC NONE SEEN  NONE SEEN    Trich, Wet Prep NONE SEEN  NONE SEEN    Clue Cells Wet Prep HPF POC NONE SEEN  NONE SEEN    WBC, Wet Prep HPF POC MODERATE (*) NONE SEEN MODERATE BACTERIA SEEN                      Imaging:  NA  ED Course   Assessment: 1. Leukorrhea   2. Vulvar edema    Plan: Discharge home Labor precautions and fetal kick counts Discussed normal finding GC/CT pending Medication List  As of 09/03/2011  5:19 AM   TAKE these  medications         calcium carbonate 500 MG chewable tablet   Commonly known as: TUMS - dosed in mg elemental calcium   Chew 3 tablets by mouth daily as needed. For heartburn      prenatal multivitamin Tabs   Take 1 tablet by mouth daily.           Follow-up Information    Follow up with [facility] on 09/03/2011.   Contact information:   [address] [place] [address] [phone]        Dorathy Kinsman, [place] 09/01/2011 11:37 AM

## 2011-09-03 ENCOUNTER — Ambulatory Visit (HOSPITAL_COMMUNITY): Payer: Medicaid Other

## 2011-09-03 ENCOUNTER — Ambulatory Visit (INDEPENDENT_AMBULATORY_CARE_PROVIDER_SITE_OTHER): Payer: Medicaid Other | Admitting: Family Medicine

## 2011-09-03 VITALS — BP 138/88 | Temp 97.2°F | Wt 238.5 lb

## 2011-09-03 DIAGNOSIS — O10019 Pre-existing essential hypertension complicating pregnancy, unspecified trimester: Secondary | ICD-10-CM

## 2011-09-03 LAB — POCT URINALYSIS DIP (DEVICE)
Bilirubin Urine: NEGATIVE
Glucose, UA: NEGATIVE mg/dL
Hgb urine dipstick: NEGATIVE
Ketones, ur: NEGATIVE mg/dL
Nitrite: NEGATIVE
Specific Gravity, Urine: 1.015 (ref 1.005–1.030)
pH: 6.5 (ref 5.0–8.0)

## 2011-09-03 NOTE — Patient Instructions (Signed)
Pregnancy - Third Trimester The third trimester of pregnancy (the last 3 months) is a period of the most rapid growth for you and your baby. The baby approaches a length of 20 inches and a weight of 6 to 10 pounds. The baby is adding on fat and getting ready for life outside your body. While inside, babies have periods of sleeping and waking, suck their thumbs, and hiccups. You can often feel small contractions of the uterus. This is false labor. It is also called Braxton-Hicks contractions. This is like a practice for labor. The usual problems in this stage of pregnancy include more difficulty breathing, swelling of the hands and feet from water retention, and having to urinate more often because of the uterus and baby pressing on your bladder.  PRENATAL EXAMS  Blood work may continue to be done during prenatal exams. These tests are done to check on your health and the probable health of your baby. Blood work is used to follow your blood levels (hemoglobin). Anemia (low hemoglobin) is common during pregnancy. Iron and vitamins are given to help prevent this. You may also continue to be checked for diabetes. Some of the past blood tests may be done again.   The size of the uterus is measured during each visit. This makes sure your baby is growing properly according to your pregnancy dates.   Your blood pressure is checked every prenatal visit. This is to make sure you are not getting toxemia.   Your urine is checked every prenatal visit for infection, diabetes and protein.   Your weight is checked at each visit. This is done to make sure gains are happening at the suggested rate and that you and your baby are growing normally.   Sometimes, an ultrasound is performed to confirm the position and the proper growth and development of the baby. This is a test done that bounces harmless sound waves off the baby so your caregiver can more accurately determine due dates.   Discuss the type of pain  medication and anesthesia you will have during your labor and delivery.   Discuss the possibility and anesthesia if a Cesarean Section might be necessary.   Inform your caregiver if there is any mental or physical violence at home.  Sometimes, a specialized non-stress test, contraction stress test and biophysical profile are done to make sure the baby is not having a problem. Checking the amniotic fluid surrounding the baby is called an amniocentesis. The amniotic fluid is removed by sticking a needle into the belly (abdomen). This is sometimes done near the end of pregnancy if an early delivery is required. In this case, it is done to help make sure the baby's lungs are mature enough for the baby to live outside of the womb. If the lungs are not mature and it is unsafe to deliver the baby, an injection of cortisone medication is given to the mother 1 to 2 days before the delivery. This helps the baby's lungs mature and makes it safer to deliver the baby. CHANGES OCCURING IN THE THIRD TRIMESTER OF PREGNANCY Your body goes through many changes during pregnancy. They vary from person to person. Talk to your caregiver about changes you notice and are concerned about.  During the last trimester, you have probably had an increase in your appetite. It is normal to have cravings for certain foods. This varies from person to person and pregnancy to pregnancy.   You may begin to get stretch marks on your hips,   abdomen, and breasts. These are normal changes in the body during pregnancy. There are no exercises or medications to take which prevent this change.   Constipation may be treated with a stool softener or adding bulk to your diet. Drinking lots of fluids, fiber in vegetables, fruits, and whole grains are helpful.   Exercising is also helpful. If you have been very active up until your pregnancy, most of these activities can be continued during your pregnancy. If you have been less active, it is helpful  to start an exercise program such as walking. Consult your caregiver before starting exercise programs.   Avoid all smoking, alcohol, un-prescribed drugs, herbs and "street drugs" during your pregnancy. These chemicals affect the formation and growth of the baby. Avoid chemicals throughout the pregnancy to ensure the delivery of a healthy infant.   Backache, varicose veins and hemorrhoids may develop or get worse.   You will tire more easily in the third trimester, which is normal.   The baby's movements may be stronger and more often.   You may become short of breath easily.   Your belly button may stick out.   A yellow discharge may leak from your breasts called colostrum.   You may have a bloody mucus discharge. This usually occurs a few days to a week before labor begins.  HOME CARE INSTRUCTIONS   Keep your caregiver's appointments. Follow your caregiver's instructions regarding medication use, exercise, and diet.   During pregnancy, you are providing food for you and your baby. Continue to eat regular, well-balanced meals. Choose foods such as meat, fish, milk and other low fat dairy products, vegetables, fruits, and whole-grain breads and cereals. Your caregiver will tell you of the ideal weight gain.   A physical sexual relationship may be continued throughout pregnancy if there are no other problems such as early (premature) leaking of amniotic fluid from the membranes, vaginal bleeding, or belly (abdominal) pain.   Exercise regularly if there are no restrictions. Check with your caregiver if you are unsure of the safety of your exercises. Greater weight gain will occur in the last 2 trimesters of pregnancy. Exercising helps:   Control your weight.   Get you in shape for labor and delivery.   You lose weight after you deliver.   Rest a lot with legs elevated, or as needed for leg cramps or low back pain.   Wear a good support or jogging bra for breast tenderness during  pregnancy. This may help if worn during sleep. Pads or tissues may be used in the bra if you are leaking colostrum.   Do not use hot tubs, steam rooms, or saunas.   Wear your seat belt when driving. This protects you and your baby if you are in an accident.   Avoid raw meat, cat litter boxes and soil used by cats. These carry germs that can cause birth defects in the baby.   It is easier to loose urine during pregnancy. Tightening up and strengthening the pelvic muscles will help with this problem. You can practice stopping your urination while you are going to the bathroom. These are the same muscles you need to strengthen. It is also the muscles you would use if you were trying to stop from passing gas. You can practice tightening these muscles up 10 times a set and repeating this about 3 times per day. Once you know what muscles to tighten up, do not perform these exercises during urination. It is more likely   to cause an infection by backing up the urine.   Ask for help if you have financial, counseling or nutritional needs during pregnancy. Your caregiver will be able to offer counseling for these needs as well as refer you for other special needs.   Make a list of emergency phone numbers and have them available.   Plan on getting help from family or friends when you go home from the hospital.   Make a trial run to the hospital.   Take prenatal classes with the father to understand, practice and ask questions about the labor and delivery.   Prepare the baby's room/nursery.   Do not travel out of the city unless it is absolutely necessary and with the advice of your caregiver.   Wear only low or no heal shoes to have better balance and prevent falling.  MEDICATIONS AND DRUG USE IN PREGNANCY  Take prenatal vitamins as directed. The vitamin should contain 1 milligram of folic acid. Keep all vitamins out of reach of children. Only a couple vitamins or tablets containing iron may be fatal  to a baby or young child when ingested.   Avoid use of all medications, including herbs, over-the-counter medications, not prescribed or suggested by your caregiver. Only take over-the-counter or prescription medicines for pain, discomfort, or fever as directed by your caregiver. Do not use aspirin, ibuprofen (Motrin, Advil, Nuprin) or naproxen (Aleve) unless OK'd by your caregiver.   Let your caregiver also know about herbs you may be using.   Alcohol is related to a number of birth defects. This includes fetal alcohol syndrome. All alcohol, in any form, should be avoided completely. Smoking will cause low birth rate and premature babies.   Street/illegal drugs are very harmful to the baby. They are absolutely forbidden. A baby born to an addicted mother will be addicted at birth. The baby will go through the same withdrawal an adult does.  SEEK MEDICAL CARE IF: You have any concerns or worries during your pregnancy. It is better to call with your questions if you feel they cannot wait, rather than worry about them. DECISIONS ABOUT CIRCUMCISION You may or may not know the sex of your baby. If you know your baby is a boy, it may be time to think about circumcision. Circumcision is the removal of the foreskin of the penis. This is the skin that covers the sensitive end of the penis. There is no proven medical need for this. Often this decision is made on what is popular at the time or based upon religious beliefs and social issues. You can discuss these issues with your caregiver or pediatrician. SEEK IMMEDIATE MEDICAL CARE IF:   An unexplained oral temperature above 102 F (38.9 C) develops, or as your caregiver suggests.   You have leaking of fluid from the vagina (birth canal). If leaking membranes are suspected, take your temperature and tell your caregiver of this when you call.   There is vaginal spotting, bleeding or passing clots. Tell your caregiver of the amount and how many pads are  used.   You develop a bad smelling vaginal discharge with a change in the color from clear to white.   You develop vomiting that lasts more than 24 hours.   You develop chills or fever.   You develop shortness of breath.   You develop burning on urination.   You loose more than 2 pounds of weight or gain more than 2 pounds of weight or as suggested by your   caregiver.   You notice sudden swelling of your face, hands, and feet or legs.   You develop belly (abdominal) pain. Round ligament discomfort is a common non-cancerous (benign) cause of abdominal pain in pregnancy. Your caregiver still must evaluate you.   You develop a severe headache that does not go away.   You develop visual problems, blurred or double vision.   If you have not felt your baby move for more than 1 hour. If you think the baby is not moving as much as usual, eat something with sugar in it and lie down on your left side for an hour. The baby should move at least 4 to 5 times per hour. Call right away if your baby moves less than that.   You fall, are in a car accident or any kind of trauma.   There is mental or physical violence at home.  Document Released: 12/16/2000 Document Revised: 12/11/2010 Document Reviewed: 06/20/2008 [employer] Patient Information 2012 [employer]. Contraception Choices Contraception (birth control) is the use of any methods or devices to prevent pregnancy. Below are some methods to help avoid pregnancy. HORMONAL METHODS   Contraceptive implant. This is a thin, plastic tube containing progesterone hormone. It does not contain estrogen hormone. Your caregiver inserts the tube in the inner part of the upper arm. The tube can remain in place for up to 3 years. After 3 years, the implant must be removed. The implant prevents the ovaries from releasing an egg (ovulation), thickens the cervical mucus which prevents sperm from entering the uterus, and thins the lining of the inside of the  uterus.   Progesterone-only injections. These injections are given every 3 months by your caregiver to prevent pregnancy. This synthetic progesterone hormone stops the ovaries from releasing eggs. It also thickens cervical mucus and changes the uterine lining. This makes it harder for sperm to survive in the uterus.   Birth control pills. These pills contain estrogen and progesterone hormone. They work by stopping the egg from forming in the ovary (ovulation). Birth control pills are prescribed by a caregiver.Birth control pills can also be used to treat heavy periods.   Minipill. This type of birth control pill contains only the progesterone hormone. They are taken every day of each month and must be prescribed by your caregiver.   Birth control patch. The patch contains hormones similar to those in birth control pills. It must be changed once a week and is prescribed by a caregiver.   Vaginal ring. The ring contains hormones similar to those in birth control pills. It is left in the vagina for 3 weeks, removed for 1 week, and then a new one is put back in place. The patient must be comfortable inserting and removing the ring from the vagina.A caregiver's prescription is necessary.   Emergency contraception. Emergency contraceptives prevent pregnancy after unprotected sexual intercourse. This pill can be taken right after sex or up to 5 days after unprotected sex. It is most effective the sooner you take the pills after having sexual intercourse. Emergency contraceptive pills are available without a prescription. Check with your pharmacist. Do not use emergency contraception as your only form of birth control.  BARRIER METHODS   Female condom. This is a thin sheath (latex or rubber) that is worn over the penis during sexual intercourse. It can be used with spermicide to increase effectiveness.   Female condom. This is a soft, loose-fitting sheath that is put into the vagina before sexual  intercourse.     Diaphragm. This is a soft, latex, dome-shaped barrier that must be fitted by a caregiver. It is inserted into the vagina, along with a spermicidal jelly. It is inserted before intercourse. The diaphragm should be left in the vagina for 6 to 8 hours after intercourse.   Cervical cap. This is a round, soft, latex or plastic cup that fits over the cervix and must be fitted by a caregiver. The cap can be left in place for up to 48 hours after intercourse.   Sponge. This is a soft, circular piece of polyurethane foam. The sponge has spermicide in it. It is inserted into the vagina after wetting it and before sexual intercourse.   Spermicides. These are chemicals that kill or block sperm from entering the cervix and uterus. They come in the form of creams, jellies, suppositories, foam, or tablets. They do not require a prescription. They are inserted into the vagina with an applicator before having sexual intercourse. The process must be repeated every time you have sexual intercourse.  INTRAUTERINE CONTRACEPTION  Intrauterine device (IUD). This is a T-shaped device that is put in a woman's uterus during a menstrual period to prevent pregnancy. There are 2 types:   Copper IUD. This type of IUD is wrapped in copper wire and is placed inside the uterus. Copper makes the uterus and fallopian tubes produce a fluid that kills sperm. It can stay in place for 10 years.   Hormone IUD. This type of IUD contains the hormone progestin (synthetic progesterone). The hormone thickens the cervical mucus and prevents sperm from entering the uterus, and it also thins the uterine lining to prevent implantation of a fertilized egg. The hormone can weaken or kill the sperm that get into the uterus. It can stay in place for 5 years.  PERMANENT METHODS OF CONTRACEPTION  Female tubal ligation. This is when the woman's fallopian tubes are surgically sealed, tied, or blocked to prevent the egg from traveling to  the uterus.   Female sterilization. This is when the female has the tubes that carry sperm tied off (vasectomy).This blocks sperm from entering the vagina during sexual intercourse. After the procedure, the man can still ejaculate fluid (semen).  NATURAL PLANNING METHODS  Natural family planning. This is not having sexual intercourse or using a barrier method (condom, diaphragm, cervical cap) on days the woman could become pregnant.   Calendar method. This is keeping track of the length of each menstrual cycle and identifying when you are fertile.   Ovulation method. This is avoiding sexual intercourse during ovulation.   Symptothermal method. This is avoiding sexual intercourse during ovulation, using a thermometer and ovulation symptoms.   Post-ovulation method. This is timing sexual intercourse after you have ovulated.  Regardless of which type or method of contraception you choose, it is important that you use condoms to protect against the transmission of sexually transmitted diseases (STDs). Talk with your caregiver about which form of contraception is most appropriate for you. Document Released: 12/22/2004 Document Revised: 12/11/2010 Document Reviewed: 04/30/2010 [employer] Patient Information 2012 [employer]. Breastfeeding BENEFITS OF BREASTFEEDING For the baby  The first milk (colostrum) helps the baby's digestive system function better.   There are antibodies from the mother in the milk that help the baby fight off infections.   The baby has a lower incidence of asthma, allergies, and SIDS (sudden infant death syndrome).   The nutrients in breast milk are better than formulas for the baby and helps the baby's brain grow better.     Babies who breastfeed have less gas, colic, and constipation.  For the mother  Breastfeeding helps develop a very special bond between mother and baby.   It is more convenient, always available at the correct temperature and cheaper than formula  feeding.   It burns calories in the mother and helps with losing weight that was gained during pregnancy.   It makes the uterus contract back down to normal size faster and slows bleeding following delivery.   Breastfeeding mothers have a lower risk of developing breast cancer.  NURSE FREQUENTLY  A healthy, full-term baby may breastfeed as often as every hour or space his or her feedings to every 3 hours.   How often to nurse will vary from baby to baby. Watch your baby for signs of hunger, not the clock.   Nurse as often as the baby requests, or when you feel the need to reduce the fullness of your breasts.   Awaken the baby if it has been 3 to 4 hours since the last feeding.   Frequent feeding will help the mother make more milk and will prevent problems like sore nipples and engorgement of the breasts.  BABY'S POSITION AT THE BREAST  Whether lying down or sitting, be sure that the baby's tummy is facing your tummy.   Support the breast with 4 fingers underneath the breast and the thumb above. Make sure your fingers are well away from the nipple and baby's mouth.   Stroke the baby's lips and cheek closest to the breast gently with your finger or nipple.   When the baby's mouth is open wide enough, place all of your nipple and as much of the dark area around the nipple as possible into your baby's mouth.   Pull the baby in close so the tip of the nose and the baby's cheeks touch the breast during the feeding.  FEEDINGS  The length of each feeding varies from baby to baby and from feeding to feeding.   The baby must suck about 2 to 3 minutes for your milk to get to him or her. This is called a "let down." For this reason, allow the baby to feed on each breast as long as he or she wants. Your baby will end the feeding when he or she has received the right balance of nutrients.   To break the suction, put your finger into the corner of the baby's mouth and slide it between his or her  gums before removing your breast from his or her mouth. This will help prevent sore nipples.  REDUCING BREAST ENGORGEMENT  In the first week after your baby is born, you may experience signs of breast engorgement. When breasts are engorged, they feel heavy, warm, full, and may be tender to the touch. You can reduce engorgement if you:   Nurse frequently, every 2 to 3 hours. Mothers who breastfeed early and often have fewer problems with engorgement.   Place light ice packs on your breasts between feedings. This reduces swelling. Wrap the ice packs in a lightweight towel to protect your skin.   Apply moist hot packs to your breast for 5 to 10 minutes before each feeding. This increases circulation and helps the milk flow.   Gently massage your breast before and during the feeding.   Make sure that the baby empties at least one breast at every feeding before switching sides.   Use a breast pump to empty the breasts if your baby is sleepy or   not nursing well. You may also want to pump if you are returning to work or or you feel you are getting engorged.   Avoid bottle feeds, pacifiers or supplemental feedings of water or juice in place of breastfeeding.   Be sure the baby is latched on and positioned properly while breastfeeding.   Prevent fatigue, stress, and anemia.   Wear a supportive bra, avoiding underwire styles.   Eat a balanced diet with enough fluids.  If you follow these suggestions, your engorgement should improve in 24 to 48 hours. If you are still experiencing difficulty, call your lactation consultant or caregiver. IS MY BABY GETTING ENOUGH MILK? Sometimes, mothers worry about whether their babies are getting enough milk. You can be assured that your baby is getting enough milk if:  The baby is actively sucking and you hear swallowing.   The baby nurses at least 8 to 12 times in a 24 hour time period. Nurse your baby until he or she unlatches or falls asleep at the first  breast (at least 10 to 20 minutes), then offer the second side.   The baby is wetting 5 to 6 disposable diapers (6 to 8 cloth diapers) in a 24 hour period by 5 to 6 days of age.   The baby is having at least 2 to 3 stools every 24 hours for the first few months. Breast milk is all the food your baby needs. It is not necessary for your baby to have water or formula. In fact, to help your breasts make more milk, it is best not to give your baby supplemental feedings during the early weeks.   The stool should be soft and yellow.   The baby should gain 4 to 7 ounces per week after he is 4 days old.  TAKE CARE OF YOURSELF Take care of your breasts by:  Bathing or showering daily.   Avoiding the use of soaps on your nipples.   Start feedings on your left breast at one feeding and on your right breast at the next feeding.   You will notice an increase in your milk supply 2 to 5 days after delivery. You may feel some discomfort from engorgement, which makes your breasts very firm and often tender. Engorgement "peaks" out within 24 to 48 hours. In the meantime, apply warm moist towels to your breasts for 5 to 10 minutes before feeding. Gentle massage and expression of some milk before feeding will soften your breasts, making it easier for your baby to latch on. Wear a well fitting nursing bra and air dry your nipples for 10 to 15 minutes after each feeding.   Only use cotton bra pads.   Only use pure lanolin on your nipples after nursing. You do not need to wash it off before nursing.  Take care of yourself by:   Eating well-balanced meals and nutritious snacks.   Drinking milk, fruit juice, and water to satisfy your thirst (about 8 glasses a day).   Getting plenty of rest.   Increasing calcium in your diet (1200 mg a day).   Avoiding foods that you notice affect the baby in a bad way.  SEEK MEDICAL CARE IF:   You have any questions or difficulty with breastfeeding.   You need help.    You have a hard, red, sore area on your breast, accompanied by a fever of 100.5 F (38.1 C) or more.   Your baby is too sleepy to eat well or is having   trouble sleeping.   Your baby is wetting less than 6 diapers per day, by 5 days of age.   Your baby's skin or white part of his or her eyes is more yellow than it was in the hospital.   You feel depressed.  Document Released: 12/22/2004 Document Revised: 12/11/2010 Document Reviewed: 08/06/2008 [employer] Patient Information 2012 [employer]. 

## 2011-09-03 NOTE — Progress Notes (Signed)
Pt has weekly BPP @ [facility]- last done on 8/26.

## 2011-09-03 NOTE — Progress Notes (Signed)
Pulse: 80 Only needs GBS, had GC/CH in [facility] Tuesday.

## 2011-09-03 NOTE — MAU Provider Note (Signed)
Attestation of Attending Supervision of Advanced Practitioner (CNM/NP): Evaluation and management procedures were performed by the Advanced Practitioner under my supervision and collaboration.  I have reviewed the Advanced Practitioner's note and chart, and I agree with the management and plan.  HARRAWAY-SMITH, Olita Takeshita 3:00 PM     

## 2011-09-03 NOTE — Progress Notes (Signed)
NST reviewed and reactive. Feels well. GBS today

## 2011-09-04 ENCOUNTER — Encounter (HOSPITAL_COMMUNITY): Payer: Self-pay | Admitting: *Deleted

## 2011-09-04 ENCOUNTER — Inpatient Hospital Stay (HOSPITAL_COMMUNITY)
Admission: AD | Admit: 2011-09-04 | Discharge: 2011-09-04 | Disposition: A | Discharge status: Home / Self Care | Payer: Medicaid Other | Source: Ambulatory Visit | Attending: Family Medicine | Admitting: Family Medicine

## 2011-09-04 DIAGNOSIS — O4690 Antepartum hemorrhage, unspecified, unspecified trimester: Secondary | ICD-10-CM

## 2011-09-04 DIAGNOSIS — O10019 Pre-existing essential hypertension complicating pregnancy, unspecified trimester: Secondary | ICD-10-CM

## 2011-09-04 DIAGNOSIS — IMO0002 Reserved for concepts with insufficient information to code with codable children: Secondary | ICD-10-CM

## 2011-09-04 DIAGNOSIS — O358XX0 Maternal care for other (suspected) fetal abnormality and damage, not applicable or unspecified: Secondary | ICD-10-CM | POA: Insufficient documentation

## 2011-09-04 DIAGNOSIS — O0990 Supervision of high risk pregnancy, unspecified, unspecified trimester: Secondary | ICD-10-CM

## 2011-09-04 DIAGNOSIS — O139 Gestational [pregnancy-induced] hypertension without significant proteinuria, unspecified trimester: Secondary | ICD-10-CM | POA: Insufficient documentation

## 2011-09-04 NOTE — MAU Provider Note (Signed)
I have seen and examined patient and agree with above. Pt denies contractions, abdominal pain, loss of fluid. Bleeding was minimal with mucous. Baby moving well. She has appointment for sono on 09/08/11. I read and interpreted NST, which was reactive.  Napoleon Form, MD 09/04/2011 6:03 PM

## 2011-09-04 NOTE — MAU Note (Signed)
Patient states she was seen at the clinic yesterday and was 3 cm. Was having bloody show last night with contractions. Having a little spotting this am with occasional mild contractions. Patient is not wearing a pad. Reports good fetal movement, no leaking.

## 2011-09-04 NOTE — MAU Provider Note (Signed)
Chief Complaint:  Vaginal Bleeding   None     HPI: Carol Blair is a 21 y.o. G2P1001 at [redacted]w[redacted]d by LMP and with gestational hypertension who presents to maternity admissions reporting that she lost her mucous plug and has had a little bloody show ever since yesterday. She was seen in clinic yesterday for 36wk visit and had a cervical exam done at that time. Ever since then, she states she has had some mucous and/or blood tinge when she wipes. She called the clinic this AM and was told she should come in for evaluation since she continued to have this brownish mucous.  Of note, she was seen in MAU last week and had a negative GC/CHl at that time.    Denies regular contractions, leakage of fluid, vaginal bleeding, vulvar pruritis or pain, fever, chills. Good fetal movement.  No headaches, vision changes, RUQ pain or worsening swelling.  Pt is getting once weekly NSTs on Thursday and weekly BPPs on Monday given her HTN and a fetal pleural effusion.  . Pregnancy Course:   Past Medical History: Past Medical History  Diagnosis Date  . Hypertension   . Obesity   . Benign essential hypertension antepartum 02/11/2011    Past obstetric history: OB History    Grav Para Term Preterm Abortions TAB SAB Ect Mult Living   2 1 1       1      # Outc Date GA Lbr Len/2nd Wgt Sex Del Anes PTL Lv   1 TRM 4/12 [redacted]w[redacted]d  6lb1.9oz(2.776kg) F SVD EPI  Yes   Comments: System Generated. Please review and update pregnancy details.   2 CUR               Past Surgical History: Past Surgical History  Procedure Date  . Laparoscopic cholecystectomy w/ cholangiography 06/10/10    Dr Gaynelle Adu    Family History: Family History  Problem Relation Age of Onset  . Hypertension Sister   . Lupus Sister   . Diabetes Sister   . Cancer Maternal Grandmother     GI  . Cancer Maternal Aunt     Breast  . Cancer Maternal Uncle     colon    Social History: History  Substance Use Topics  . Smoking status: Never  Smoker   . Smokeless tobacco: Never Used  . Alcohol Use: No    Allergies: No Known Allergies  Meds:  Prescriptions prior to admission  Medication Sig Dispense Refill  . calcium carbonate (TUMS - DOSED IN MG ELEMENTAL CALCIUM) 500 MG chewable tablet Chew 3 tablets by mouth daily as needed. For heartburn      . Prenatal Vit-Fe Fumarate-FA (PRENATAL MULTIVITAMIN) TABS Take 1 tablet by mouth daily.       . diphenhydrAMINE (BENADRYL) 25 MG tablet Take 25 mg by mouth every 6 (six) hours as needed. allergies        ROS: Pertinent findings in history of present illness.  Physical Exam  Blood pressure 140/71, pulse 62, temperature 98.1 F (36.7 C), temperature source Oral, resp. rate 16, height 5\' 5"  (1.651 m), weight 107.593 kg (237 lb 3.2 oz), last menstrual period 12/25/2010, SpO2 100.00%.  GENERAL: Well-developed, well-nourished female in no acute distress.  HEENT: normocephalic HEART: normal rate RESP: normal effort ABDOMEN: Soft, non-tender, gravid appropriate for gestational age EXTREMITIES: Nontender, no edema NEURO: alert and oriented Dilation: 3 Effacement (%): 70 Cervical Position: Middle Presentation: Vertex Exam by:: Franki Cabot RN  FHT:  Baseline  135 , moderate variability, accelerations present, no decelerations Contractions: x1.    Labs: No results found for this or any previous visit (from the past 24 hour(s)).  Imaging:  US Ob Limited  08/20/2011  OBSTETRICAL ULTRASOUND: This exam was performed within a [facility]. The OB US report was generated in the AS system, and faxed to the ordering physician.   This report is also available in [employer] and in the [employer]. See AS Obstetric US report.   US Ob Follow Up  08/20/2011  OBSTETRICAL ULTRASOUND: This exam was performed within a [place] [facility]. The OB US report was generated in the AS system, and faxed to the ordering physician.   This report  is also available in [employer] and in the [employer]. See AS Obstetric US report.   US Fetal Bpp W/o Non Stress  08/31/2011  OBSTETRICAL ULTRASOUND: This exam was performed within a [place] [facility]. The OB US report was generated in the AS system, and faxed to the ordering physician.   This report is also available in [employer] and in the [employer]. See AS Obstetric US report.   US Fetal Bpp W/o Non Stress  08/24/2011  OBSTETRICAL ULTRASOUND: This exam was performed within a [place] [facility]. The OB US report was generated in the AS system, and faxed to the ordering physician.   This report is also available in [employer] and in the [employer]. See AS Obstetric US report.   US Fetal Bpp W/o Non Stress  08/20/2011  OBSTETRICAL ULTRASOUND: This exam was performed within a [place] [facility]. The OB US report was generated in the AS system, and faxed to the ordering physician.   This report is also available in [employer] and in the [employer]. See AS Obstetric US report.   US Fetal Bpp W/o Non Stress  08/17/2011  OBSTETRICAL ULTRASOUND: This exam was performed within a [facility] [facility]. The OB US report was generated in the AS system, and faxed to the ordering physician.   This report is also available in [employer] and in the [employer]. See AS Obstetric US report.   US Fetal Bpp W/o Non Stress  08/06/2011  OBSTETRICAL ULTRASOUND: This exam was performed within a  Ultrasound Department. The OB US report was generated in the AS system, and faxed to the ordering physician.   This report is also available in [employer] and in the [employer]. See AS Obstetric US report.   ED Course   Assessment: 1. Pleural effusion, fetal   2. Supervision of high-risk pregnancy   3. Benign  essential hypertension antepartum     Plan:  1) bloody show - no blood on exam - cervix unchanged from clinic yesterday - no contractions on monitor - likely 2/2 cervical checks - strict labor precautions discussed  2) gestational hypertension - pressures at baseline here - urine dipstick negative for protein in clinic yesterday - no signs/symptoms of pre-E  3) fetal well-being - category I tracing - GBS collected yesterday - reactive NST today - plan for BPP next week as scheduled  Discharge home Labor precautions and fetal kick counts  Medication List  As of 09/04/2011 12:53 PM   ASK your doctor about these medications         calcium carbonate 500 MG chewable tablet   Commonly known as: TUMS -  dosed in mg elemental calcium   Chew 3 tablets by mouth daily as needed. For heartburn      diphenhydrAMINE 25 MG tablet   Commonly known as: BENADRYL   Take 25 mg by mouth every 6 (six) hours as needed. allergies      prenatal multivitamin Tabs   Take 1 tablet by mouth daily.            Rulon Abide Medical Resident 09/04/2011 12:53 PM

## 2011-09-08 ENCOUNTER — Ambulatory Visit (HOSPITAL_COMMUNITY)
Admission: RE | Admit: 2011-09-08 | Discharge: 2011-09-08 | Disposition: A | Discharge status: Home / Self Care | Payer: Medicaid Other | Source: Ambulatory Visit | Attending: Family Medicine | Admitting: Family Medicine

## 2011-09-08 ENCOUNTER — Encounter: Payer: Self-pay | Admitting: Family Medicine

## 2011-09-08 VITALS — BP 137/85 | HR 87 | Wt 238.5 lb

## 2011-09-08 DIAGNOSIS — O10019 Pre-existing essential hypertension complicating pregnancy, unspecified trimester: Secondary | ICD-10-CM | POA: Insufficient documentation

## 2011-09-08 DIAGNOSIS — O0990 Supervision of high risk pregnancy, unspecified, unspecified trimester: Secondary | ICD-10-CM

## 2011-09-08 DIAGNOSIS — IMO0002 Reserved for concepts with insufficient information to code with codable children: Secondary | ICD-10-CM

## 2011-09-08 DIAGNOSIS — I10 Essential (primary) hypertension: Secondary | ICD-10-CM

## 2011-09-08 DIAGNOSIS — O358XX0 Maternal care for other (suspected) fetal abnormality and damage, not applicable or unspecified: Secondary | ICD-10-CM | POA: Insufficient documentation

## 2011-09-08 DIAGNOSIS — E669 Obesity, unspecified: Secondary | ICD-10-CM | POA: Insufficient documentation

## 2011-09-08 DIAGNOSIS — O99210 Obesity complicating pregnancy, unspecified trimester: Secondary | ICD-10-CM | POA: Insufficient documentation

## 2011-09-10 ENCOUNTER — Encounter: Payer: Self-pay | Admitting: Family Medicine

## 2011-09-10 ENCOUNTER — Ambulatory Visit (INDEPENDENT_AMBULATORY_CARE_PROVIDER_SITE_OTHER): Payer: Medicaid Other | Admitting: Family Medicine

## 2011-09-10 VITALS — BP 138/86 | Temp 99.2°F | Wt 236.6 lb

## 2011-09-10 DIAGNOSIS — O169 Unspecified maternal hypertension, unspecified trimester: Secondary | ICD-10-CM

## 2011-09-10 DIAGNOSIS — B373 Candidiasis of vulva and vagina: Secondary | ICD-10-CM

## 2011-09-10 DIAGNOSIS — I10 Essential (primary) hypertension: Secondary | ICD-10-CM

## 2011-09-10 DIAGNOSIS — O10019 Pre-existing essential hypertension complicating pregnancy, unspecified trimester: Secondary | ICD-10-CM

## 2011-09-10 DIAGNOSIS — J90 Pleural effusion, not elsewhere classified: Secondary | ICD-10-CM

## 2011-09-10 DIAGNOSIS — IMO0002 Reserved for concepts with insufficient information to code with codable children: Secondary | ICD-10-CM

## 2011-09-10 LAB — POCT URINALYSIS DIP (DEVICE)
Glucose, UA: NEGATIVE mg/dL
Ketones, ur: NEGATIVE mg/dL
Nitrite: NEGATIVE
Specific Gravity, Urine: 1.02 (ref 1.005–1.030)
pH: 6 (ref 5.0–8.0)

## 2011-09-10 MED ORDER — FLUCONAZOLE 150 MG PO TABS: 150.0000 mg | ORAL_TABLET | Freq: Every day | ORAL | Status: AC

## 2011-09-10 NOTE — Patient Instructions (Signed)
Pregnancy - Third Trimester The third trimester of pregnancy (the last 3 months) is a period of the most rapid growth for you and your baby. The baby approaches a length of 20 inches and a weight of 6 to 10 pounds. The baby is adding on fat and getting ready for life outside your body. While inside, babies have periods of sleeping and waking, suck their thumbs, and hiccups. You can often feel small contractions of the uterus. This is false labor. It is also called Braxton-Hicks contractions. This is like a practice for labor. The usual problems in this stage of pregnancy include more difficulty breathing, swelling of the hands and feet from water retention, and having to urinate more often because of the uterus and baby pressing on your bladder.  PRENATAL EXAMS  Blood work may continue to be done during prenatal exams. These tests are done to check on your health and the probable health of your baby. Blood work is used to follow your blood levels (hemoglobin). Anemia (low hemoglobin) is common during pregnancy. Iron and vitamins are given to help prevent this. You may also continue to be checked for diabetes. Some of the past blood tests may be done again.   The size of the uterus is measured during each visit. This makes sure your baby is growing properly according to your pregnancy dates.   Your blood pressure is checked every prenatal visit. This is to make sure you are not getting toxemia.   Your urine is checked every prenatal visit for infection, diabetes and protein.   Your weight is checked at each visit. This is done to make sure gains are happening at the suggested rate and that you and your baby are growing normally.   Sometimes, an ultrasound is performed to confirm the position and the proper growth and development of the baby. This is a test done that bounces harmless sound waves off the baby so your caregiver can more accurately determine due dates.   Discuss the type of pain  medication and anesthesia you will have during your labor and delivery.   Discuss the possibility and anesthesia if a Cesarean Section might be necessary.   Inform your caregiver if there is any mental or physical violence at home.  Sometimes, a specialized non-stress test, contraction stress test and biophysical profile are done to make sure the baby is not having a problem. Checking the amniotic fluid surrounding the baby is called an amniocentesis. The amniotic fluid is removed by sticking a needle into the belly (abdomen). This is sometimes done near the end of pregnancy if an early delivery is required. In this case, it is done to help make sure the baby's lungs are mature enough for the baby to live outside of the womb. If the lungs are not mature and it is unsafe to deliver the baby, an injection of cortisone medication is given to the mother 1 to 2 days before the delivery. This helps the baby's lungs mature and makes it safer to deliver the baby. CHANGES OCCURING IN THE THIRD TRIMESTER OF PREGNANCY Your body goes through many changes during pregnancy. They vary from person to person. Talk to your caregiver about changes you notice and are concerned about.  During the last trimester, you have probably had an increase in your appetite. It is normal to have cravings for certain foods. This varies from person to person and pregnancy to pregnancy.   You may begin to get stretch marks on your hips,   abdomen, and breasts. These are normal changes in the body during pregnancy. There are no exercises or medications to take which prevent this change.   Constipation may be treated with a stool softener or adding bulk to your diet. Drinking lots of fluids, fiber in vegetables, fruits, and whole grains are helpful.   Exercising is also helpful. If you have been very active up until your pregnancy, most of these activities can be continued during your pregnancy. If you have been less active, it is helpful  to start an exercise program such as walking. Consult your caregiver before starting exercise programs.   Avoid all smoking, alcohol, un-prescribed drugs, herbs and "street drugs" during your pregnancy. These chemicals affect the formation and growth of the baby. Avoid chemicals throughout the pregnancy to ensure the delivery of a healthy infant.   Backache, varicose veins and hemorrhoids may develop or get worse.   You will tire more easily in the third trimester, which is normal.   The baby's movements may be stronger and more often.   You may become short of breath easily.   Your belly button may stick out.   A yellow discharge may leak from your breasts called colostrum.   You may have a bloody mucus discharge. This usually occurs a few days to a week before labor begins.  HOME CARE INSTRUCTIONS   Keep your caregiver's appointments. Follow your caregiver's instructions regarding medication use, exercise, and diet.   During pregnancy, you are providing food for you and your baby. Continue to eat regular, well-balanced meals. Choose foods such as meat, fish, milk and other low fat dairy products, vegetables, fruits, and whole-grain breads and cereals. Your caregiver will tell you of the ideal weight gain.   A physical sexual relationship may be continued throughout pregnancy if there are no other problems such as early (premature) leaking of amniotic fluid from the membranes, vaginal bleeding, or belly (abdominal) pain.   Exercise regularly if there are no restrictions. Check with your caregiver if you are unsure of the safety of your exercises. Greater weight gain will occur in the last 2 trimesters of pregnancy. Exercising helps:   Control your weight.   Get you in shape for labor and delivery.   You lose weight after you deliver.   Rest a lot with legs elevated, or as needed for leg cramps or low back pain.   Wear a good support or jogging bra for breast tenderness during  pregnancy. This may help if worn during sleep. Pads or tissues may be used in the bra if you are leaking colostrum.   Do not use hot tubs, steam rooms, or saunas.   Wear your seat belt when driving. This protects you and your baby if you are in an accident.   Avoid raw meat, cat litter boxes and soil used by cats. These carry germs that can cause birth defects in the baby.   It is easier to loose urine during pregnancy. Tightening up and strengthening the pelvic muscles will help with this problem. You can practice stopping your urination while you are going to the bathroom. These are the same muscles you need to strengthen. It is also the muscles you would use if you were trying to stop from passing gas. You can practice tightening these muscles up 10 times a set and repeating this about 3 times per day. Once you know what muscles to tighten up, do not perform these exercises during urination. It is more likely   to cause an infection by backing up the urine.   Ask for help if you have financial, counseling or nutritional needs during pregnancy. Your caregiver will be able to offer counseling for these needs as well as refer you for other special needs.   Make a list of emergency phone numbers and have them available.   Plan on getting help from family or friends when you go home from the hospital.   Make a trial run to the hospital.   Take prenatal classes with the father to understand, practice and ask questions about the labor and delivery.   Prepare the baby's room/nursery.   Do not travel out of the city unless it is absolutely necessary and with the advice of your caregiver.   Wear only low or no heal shoes to have better balance and prevent falling.  MEDICATIONS AND DRUG USE IN PREGNANCY  Take prenatal vitamins as directed. The vitamin should contain 1 milligram of folic acid. Keep all vitamins out of reach of children. Only a couple vitamins or tablets containing iron may be fatal  to a baby or young child when ingested.   Avoid use of all medications, including herbs, over-the-counter medications, not prescribed or suggested by your caregiver. Only take over-the-counter or prescription medicines for pain, discomfort, or fever as directed by your caregiver. Do not use aspirin, ibuprofen (Motrin, Advil, Nuprin) or naproxen (Aleve) unless OK'd by your caregiver.   Let your caregiver also know about herbs you may be using.   Alcohol is related to a number of birth defects. This includes fetal alcohol syndrome. All alcohol, in any form, should be avoided completely. Smoking will cause low birth rate and premature babies.   Street/illegal drugs are very harmful to the baby. They are absolutely forbidden. A baby born to an addicted mother will be addicted at birth. The baby will go through the same withdrawal an adult does.  SEEK MEDICAL CARE IF: You have any concerns or worries during your pregnancy. It is better to call with your questions if you feel they cannot wait, rather than worry about them. DECISIONS ABOUT CIRCUMCISION You may or may not know the sex of your baby. If you know your baby is a boy, it may be time to think about circumcision. Circumcision is the removal of the foreskin of the penis. This is the skin that covers the sensitive end of the penis. There is no proven medical need for this. Often this decision is made on what is popular at the time or based upon religious beliefs and social issues. You can discuss these issues with your caregiver or pediatrician. SEEK IMMEDIATE MEDICAL CARE IF:   An unexplained oral temperature above 102 F (38.9 C) develops, or as your caregiver suggests.   You have leaking of fluid from the vagina (birth canal). If leaking membranes are suspected, take your temperature and tell your caregiver of this when you call.   There is vaginal spotting, bleeding or passing clots. Tell your caregiver of the amount and how many pads are  used.   You develop a bad smelling vaginal discharge with a change in the color from clear to white.   You develop vomiting that lasts more than 24 hours.   You develop chills or fever.   You develop shortness of breath.   You develop burning on urination.   You loose more than 2 pounds of weight or gain more than 2 pounds of weight or as suggested by your   caregiver.   You notice sudden swelling of your face, hands, and feet or legs.   You develop belly (abdominal) pain. Round ligament discomfort is a common non-cancerous (benign) cause of abdominal pain in pregnancy. Your caregiver still must evaluate you.   You develop a severe headache that does not go away.   You develop visual problems, blurred or double vision.   If you have not felt your baby move for more than 1 hour. If you think the baby is not moving as much as usual, eat something with sugar in it and lie down on your left side for an hour. The baby should move at least 4 to 5 times per hour. Call right away if your baby moves less than that.   You fall, are in a car accident or any kind of trauma.   There is mental or physical violence at home.  Document Released: 12/16/2000 Document Revised: 12/11/2010 Document Reviewed: 06/20/2008 [employer] Patient Information 2012 [employer]. Breastfeeding BENEFITS OF BREASTFEEDING For the baby  The first milk (colostrum) helps the baby's digestive system function better.   There are antibodies from the mother in the milk that help the baby fight off infections.   The baby has a lower incidence of asthma, allergies, and SIDS (sudden infant death syndrome).   The nutrients in breast milk are better than formulas for the baby and helps the baby's brain grow better.   Babies who breastfeed have less gas, colic, and constipation.  For the mother  Breastfeeding helps develop a very special bond between mother and baby.   It is more convenient, always available at the  correct temperature and cheaper than formula feeding.   It burns calories in the mother and helps with losing weight that was gained during pregnancy.   It makes the uterus contract back down to normal size faster and slows bleeding following delivery.   Breastfeeding mothers have a lower risk of developing breast cancer.  NURSE FREQUENTLY  A healthy, full-term baby may breastfeed as often as every hour or space his or her feedings to every 3 hours.   How often to nurse will vary from baby to baby. Watch your baby for signs of hunger, not the clock.   Nurse as often as the baby requests, or when you feel the need to reduce the fullness of your breasts.   Awaken the baby if it has been 3 to 4 hours since the last feeding.   Frequent feeding will help the mother make more milk and will prevent problems like sore nipples and engorgement of the breasts.  BABY'S POSITION AT THE BREAST  Whether lying down or sitting, be sure that the baby's tummy is facing your tummy.   Support the breast with 4 fingers underneath the breast and the thumb above. Make sure your fingers are well away from the nipple and baby's mouth.   Stroke the baby's lips and cheek closest to the breast gently with your finger or nipple.   When the baby's mouth is open wide enough, place all of your nipple and as much of the dark area around the nipple as possible into your baby's mouth.   Pull the baby in close so the tip of the nose and the baby's cheeks touch the breast during the feeding.  FEEDINGS  The length of each feeding varies from baby to baby and from feeding to feeding.   The baby must suck about 2 to 3 minutes for your milk   to get to him or her. This is called a "let down." For this reason, allow the baby to feed on each breast as long as he or she wants. Your baby will end the feeding when he or she has received the right balance of nutrients.   To break the suction, put your finger into the corner of the  baby's mouth and slide it between his or her gums before removing your breast from his or her mouth. This will help prevent sore nipples.  REDUCING BREAST ENGORGEMENT  In the first week after your baby is born, you may experience signs of breast engorgement. When breasts are engorged, they feel heavy, warm, full, and may be tender to the touch. You can reduce engorgement if you:   Nurse frequently, every 2 to 3 hours. Mothers who breastfeed early and often have fewer problems with engorgement.   Place light ice packs on your breasts between feedings. This reduces swelling. Wrap the ice packs in a lightweight towel to protect your skin.   Apply moist hot packs to your breast for 5 to 10 minutes before each feeding. This increases circulation and helps the milk flow.   Gently massage your breast before and during the feeding.   Make sure that the baby empties at least one breast at every feeding before switching sides.   Use a breast pump to empty the breasts if your baby is sleepy or not nursing well. You may also want to pump if you are returning to work or or you feel you are getting engorged.   Avoid bottle feeds, pacifiers or supplemental feedings of water or juice in place of breastfeeding.   Be sure the baby is latched on and positioned properly while breastfeeding.   Prevent fatigue, stress, and anemia.   Wear a supportive bra, avoiding underwire styles.   Eat a balanced diet with enough fluids.  If you follow these suggestions, your engorgement should improve in 24 to 48 hours. If you are still experiencing difficulty, call your lactation consultant or caregiver. IS MY BABY GETTING ENOUGH MILK? Sometimes, mothers worry about whether their babies are getting enough milk. You can be assured that your baby is getting enough milk if:  The baby is actively sucking and you hear swallowing.   The baby nurses at least 8 to 12 times in a 24 hour time period. Nurse your baby until he or  she unlatches or falls asleep at the first breast (at least 10 to 20 minutes), then offer the second side.   The baby is wetting 5 to 6 disposable diapers (6 to 8 cloth diapers) in a 24 hour period by 5 to 6 days of age.   The baby is having at least 2 to 3 stools every 24 hours for the first few months. Breast milk is all the food your baby needs. It is not necessary for your baby to have water or formula. In fact, to help your breasts make more milk, it is best not to give your baby supplemental feedings during the early weeks.   The stool should be soft and yellow.   The baby should gain 4 to 7 ounces per week after he is 4 days old.  TAKE CARE OF YOURSELF Take care of your breasts by:  Bathing or showering daily.   Avoiding the use of soaps on your nipples.   Start feedings on your left breast at one feeding and on your right breast at the next feeding.     You will notice an increase in your milk supply 2 to 5 days after delivery. You may feel some discomfort from engorgement, which makes your breasts very firm and often tender. Engorgement "peaks" out within 24 to 48 hours. In the meantime, apply warm moist towels to your breasts for 5 to 10 minutes before feeding. Gentle massage and expression of some milk before feeding will soften your breasts, making it easier for your baby to latch on. Wear a well fitting nursing bra and air dry your nipples for 10 to 15 minutes after each feeding.   Only use cotton bra pads.   Only use pure lanolin on your nipples after nursing. You do not need to wash it off before nursing.  Take care of yourself by:   Eating well-balanced meals and nutritious snacks.   Drinking milk, fruit juice, and water to satisfy your thirst (about 8 glasses a day).   Getting plenty of rest.   Increasing calcium in your diet (1200 mg a day).   Avoiding foods that you notice affect the baby in a bad way.  SEEK MEDICAL CARE IF:   You have any questions or difficulty  with breastfeeding.   You need help.   You have a hard, red, sore area on your breast, accompanied by a fever of 100.5 F (38.1 C) or more.   Your baby is too sleepy to eat well or is having trouble sleeping.   Your baby is wetting less than 6 diapers per day, by 5 days of age.   Your baby's skin or white part of his or her eyes is more yellow than it was in the hospital.   You feel depressed.  Document Released: 12/22/2004 Document Revised: 12/11/2010 Document Reviewed: 08/06/2008 [employer] Patient Information 2012 [employer]. 

## 2011-09-10 NOTE — Progress Notes (Signed)
Pulse- 80  Edema- "vaginal"  Pain/pressure- lower abd Vaginal discharge- white, itchy

## 2011-09-10 NOTE — Progress Notes (Signed)
NST reviewed and reactive. GBS neg

## 2011-09-15 ENCOUNTER — Ambulatory Visit (HOSPITAL_COMMUNITY)
Admission: RE | Admit: 2011-09-15 | Discharge: 2011-09-15 | Disposition: A | Discharge status: Home / Self Care | Payer: Medicaid Other | Source: Ambulatory Visit | Attending: Family Medicine | Admitting: Family Medicine

## 2011-09-15 VITALS — BP 134/77 | HR 61 | Wt 239.0 lb

## 2011-09-15 DIAGNOSIS — O99210 Obesity complicating pregnancy, unspecified trimester: Secondary | ICD-10-CM | POA: Insufficient documentation

## 2011-09-15 DIAGNOSIS — O1002 Pre-existing essential hypertension complicating childbirth: Principal | ICD-10-CM | POA: Diagnosis present

## 2011-09-15 DIAGNOSIS — O10019 Pre-existing essential hypertension complicating pregnancy, unspecified trimester: Secondary | ICD-10-CM

## 2011-09-15 DIAGNOSIS — I10 Essential (primary) hypertension: Secondary | ICD-10-CM

## 2011-09-15 DIAGNOSIS — E669 Obesity, unspecified: Secondary | ICD-10-CM | POA: Diagnosis present

## 2011-09-15 DIAGNOSIS — IMO0002 Reserved for concepts with insufficient information to code with codable children: Secondary | ICD-10-CM

## 2011-09-15 DIAGNOSIS — O358XX0 Maternal care for other (suspected) fetal abnormality and damage, not applicable or unspecified: Secondary | ICD-10-CM | POA: Insufficient documentation

## 2011-09-15 DIAGNOSIS — O0990 Supervision of high risk pregnancy, unspecified, unspecified trimester: Secondary | ICD-10-CM

## 2011-09-15 NOTE — Progress Notes (Signed)
Carol Blair  was seen today for an ultrasound appointment.  See full report in [facility].  Alpha Gula, MD  IUP at 37+5 weeks Small pocket of pericardial effusion - isolated No signs of evolving fetal hydrops; had normal fetal echo Normal amniotic fluid volume  BPP 8/8   Continue twice weekly NSTs with weekly BPPs

## 2011-09-16 ENCOUNTER — Inpatient Hospital Stay (HOSPITAL_COMMUNITY)
Admission: AD | Admit: 2011-09-16 | Discharge: 2011-09-18 | DRG: 774 | Disposition: A | Discharge status: Home / Self Care | Payer: Medicaid Other | Source: Ambulatory Visit | Attending: Obstetrics & Gynecology | Admitting: Obstetrics & Gynecology

## 2011-09-16 ENCOUNTER — Encounter (HOSPITAL_COMMUNITY): Payer: Self-pay | Admitting: *Deleted

## 2011-09-16 DIAGNOSIS — IMO0002 Reserved for concepts with insufficient information to code with codable children: Secondary | ICD-10-CM

## 2011-09-16 DIAGNOSIS — E669 Obesity, unspecified: Secondary | ICD-10-CM

## 2011-09-16 DIAGNOSIS — O99214 Obesity complicating childbirth: Secondary | ICD-10-CM

## 2011-09-16 DIAGNOSIS — O1002 Pre-existing essential hypertension complicating childbirth: Secondary | ICD-10-CM

## 2011-09-16 DIAGNOSIS — O0990 Supervision of high risk pregnancy, unspecified, unspecified trimester: Secondary | ICD-10-CM

## 2011-09-16 DIAGNOSIS — O10019 Pre-existing essential hypertension complicating pregnancy, unspecified trimester: Secondary | ICD-10-CM

## 2011-09-16 LAB — TYPE AND SCREEN
ABO/RH(D): O POS
Antibody Screen: POSITIVE

## 2011-09-16 LAB — CBC
Hemoglobin: 12.4 g/dL (ref 12.0–15.0)
RBC: 3.99 MIL/uL (ref 3.87–5.11)

## 2011-09-16 LAB — RPR: RPR Ser Ql: NONREACTIVE

## 2011-09-16 MED ORDER — ONDANSETRON HCL 4 MG/2ML IJ SOLN: 4.0000 mg | Freq: Four times a day (QID) | INTRAMUSCULAR | Status: DC | PRN

## 2011-09-16 MED ORDER — IBUPROFEN 600 MG PO TABS
600.0000 mg | ORAL_TABLET | Freq: Four times a day (QID) | ORAL | Status: DC | PRN
  Administered 2011-09-16: 600 mg via ORAL
  Filled 2011-09-16: qty 1

## 2011-09-16 MED ORDER — BENZOCAINE-MENTHOL 20-0.5 % EX AERO: 1.0000 "application " | INHALATION_SPRAY | CUTANEOUS | Status: DC | PRN

## 2011-09-16 MED ORDER — CITRIC ACID-SODIUM CITRATE 334-500 MG/5ML PO SOLN: 30.0000 mL | ORAL | Status: DC | PRN

## 2011-09-16 MED ORDER — DIBUCAINE 1 % RE OINT: 1.0000 "application " | TOPICAL_OINTMENT | RECTAL | Status: DC | PRN

## 2011-09-16 MED ORDER — PHENYLEPHRINE 40 MCG/ML (10ML) SYRINGE FOR IV PUSH (FOR BLOOD PRESSURE SUPPORT)
PREFILLED_SYRINGE | INTRAVENOUS | Status: AC
  Filled 2011-09-16: qty 5

## 2011-09-16 MED ORDER — OXYTOCIN BOLUS FROM INFUSION
500.0000 mL | Freq: Once | INTRAVENOUS | Status: AC
  Administered 2011-09-16: 500 mL via INTRAVENOUS
  Filled 2011-09-16: qty 500

## 2011-09-16 MED ORDER — OXYCODONE-ACETAMINOPHEN 5-325 MG PO TABS: 1.0000 | ORAL_TABLET | ORAL | Status: DC | PRN

## 2011-09-16 MED ORDER — LIDOCAINE HCL (PF) 1 % IJ SOLN
INTRAMUSCULAR | Status: AC
  Filled 2011-09-16: qty 30

## 2011-09-16 MED ORDER — OXYTOCIN 40 UNITS IN LACTATED RINGERS INFUSION - SIMPLE MED: 62.5000 mL/h | INTRAVENOUS | Status: DC | PRN

## 2011-09-16 MED ORDER — LACTATED RINGERS IV SOLN: 500.0000 mL | INTRAVENOUS | Status: DC | PRN

## 2011-09-16 MED ORDER — BISACODYL 10 MG RE SUPP: 10.0000 mg | Freq: Every day | RECTAL | Status: DC | PRN

## 2011-09-16 MED ORDER — PRENATAL MULTIVITAMIN CH
1.0000 | ORAL_TABLET | Freq: Every day | ORAL | Status: DC
  Administered 2011-09-16 – 2011-09-17 (×2): 1 via ORAL
  Filled 2011-09-16 (×2): qty 1

## 2011-09-16 MED ORDER — TETANUS-DIPHTH-ACELL PERTUSSIS 5-2.5-18.5 LF-MCG/0.5 IM SUSP
0.5000 mL | Freq: Once | INTRAMUSCULAR | Status: AC
  Administered 2011-09-18: 0.5 mL via INTRAMUSCULAR
  Filled 2011-09-16 (×2): qty 0.5

## 2011-09-16 MED ORDER — OXYCODONE-ACETAMINOPHEN 5-325 MG PO TABS
1.0000 | ORAL_TABLET | ORAL | Status: DC | PRN
  Administered 2011-09-16: 1 via ORAL
  Filled 2011-09-16: qty 1

## 2011-09-16 MED ORDER — LACTATED RINGERS IV SOLN
INTRAVENOUS | Status: DC
  Administered 2011-09-16: 01:00:00 via INTRAVENOUS

## 2011-09-16 MED ORDER — METHYLERGONOVINE MALEATE 0.2 MG PO TABS: 0.2000 mg | ORAL_TABLET | ORAL | Status: DC | PRN

## 2011-09-16 MED ORDER — MEASLES, MUMPS & RUBELLA VAC ~~LOC~~ INJ
0.5000 mL | INJECTION | Freq: Once | SUBCUTANEOUS | Status: DC
  Filled 2011-09-16: qty 0.5

## 2011-09-16 MED ORDER — SENNOSIDES-DOCUSATE SODIUM 8.6-50 MG PO TABS
2.0000 | ORAL_TABLET | Freq: Every day | ORAL | Status: DC
  Administered 2011-09-16 – 2011-09-17 (×2): 2 via ORAL

## 2011-09-16 MED ORDER — OXYTOCIN 40 UNITS IN LACTATED RINGERS INFUSION - SIMPLE MED: 62.5000 mL/h | Freq: Once | INTRAVENOUS | Status: DC

## 2011-09-16 MED ORDER — IBUPROFEN 600 MG PO TABS
600.0000 mg | ORAL_TABLET | Freq: Four times a day (QID) | ORAL | Status: DC
  Administered 2011-09-16 – 2011-09-18 (×10): 600 mg via ORAL
  Filled 2011-09-16 (×10): qty 1

## 2011-09-16 MED ORDER — WITCH HAZEL-GLYCERIN EX PADS: 1.0000 "application " | MEDICATED_PAD | CUTANEOUS | Status: DC | PRN

## 2011-09-16 MED ORDER — LANOLIN HYDROUS EX OINT: TOPICAL_OINTMENT | CUTANEOUS | Status: DC | PRN

## 2011-09-16 MED ORDER — OXYTOCIN 40 UNITS IN LACTATED RINGERS INFUSION - SIMPLE MED
INTRAVENOUS | Status: AC
  Filled 2011-09-16: qty 1000

## 2011-09-16 MED ORDER — FENTANYL 2.5 MCG/ML BUPIVACAINE 1/10 % EPIDURAL INFUSION (WH - ANES)
INTRAMUSCULAR | Status: AC
  Filled 2011-09-16: qty 60

## 2011-09-16 MED ORDER — DIPHENHYDRAMINE HCL 25 MG PO CAPS: 25.0000 mg | ORAL_CAPSULE | Freq: Four times a day (QID) | ORAL | Status: DC | PRN

## 2011-09-16 MED ORDER — FLEET ENEMA 7-19 GM/118ML RE ENEM: 1.0000 | ENEMA | Freq: Every day | RECTAL | Status: DC | PRN

## 2011-09-16 MED ORDER — METHYLERGONOVINE MALEATE 0.2 MG/ML IJ SOLN: 0.2000 mg | INTRAMUSCULAR | Status: DC | PRN

## 2011-09-16 MED ORDER — ONDANSETRON HCL 4 MG/2ML IJ SOLN: 4.0000 mg | INTRAMUSCULAR | Status: DC | PRN

## 2011-09-16 MED ORDER — FLEET ENEMA 7-19 GM/118ML RE ENEM: 1.0000 | ENEMA | RECTAL | Status: DC | PRN

## 2011-09-16 MED ORDER — SIMETHICONE 80 MG PO CHEW: 80.0000 mg | CHEWABLE_TABLET | ORAL | Status: DC | PRN

## 2011-09-16 MED ORDER — ONDANSETRON HCL 4 MG PO TABS: 4.0000 mg | ORAL_TABLET | ORAL | Status: DC | PRN

## 2011-09-16 MED ORDER — ZOLPIDEM TARTRATE 5 MG PO TABS: 5.0000 mg | ORAL_TABLET | Freq: Every evening | ORAL | Status: DC | PRN

## 2011-09-16 MED ORDER — EPHEDRINE 5 MG/ML INJ
INTRAVENOUS | Status: AC
  Filled 2011-09-16: qty 4

## 2011-09-16 MED ORDER — LIDOCAINE HCL (PF) 1 % IJ SOLN: 30.0000 mL | INTRAMUSCULAR | Status: DC | PRN

## 2011-09-16 MED ORDER — ACETAMINOPHEN 325 MG PO TABS: 650.0000 mg | ORAL_TABLET | ORAL | Status: DC | PRN

## 2011-09-16 MED ORDER — MEDROXYPROGESTERONE ACETATE 150 MG/ML IM SUSP
150.0000 mg | INTRAMUSCULAR | Status: DC | PRN
Start: 2011-09-16 — End: 2011-09-18

## 2011-09-16 NOTE — H&P (Signed)
Carol Blair is a 21 y.o. female presenting for active labor. History Pt is a 21 y.o. G2P1001 at [redacted]w[redacted]d here in active labor, with cervix initially measuring 7/90/0 contracting every 2 minutes and very uncomfortable.  [facility] due to previous HTN with pregnancy benign essential HTN antenatally with this pregnancy, fetal pericardial effusion in early [place], and ASCUS with positive high risk HPV.  OB History    Grav Para Term Preterm Abortions TAB SAB Ect Mult Living   2 2 2       2      Past Medical History  Diagnosis Date  . Hypertension   . Obesity   . Benign essential hypertension antepartum 02/11/2011   Past Surgical History  Procedure Date  . Laparoscopic cholecystectomy w/ cholangiography 06/10/10    Dr Gaynelle Adu   Family History: family history includes Cancer in her maternal aunt, maternal grandmother, and maternal uncle; Diabetes in her sister; Hypertension in her sister; and Lupus in her sister. Social History:  reports that she has never smoked. She has never used smokeless tobacco. She reports that she does not drink alcohol or use illicit drugs.   Prenatal Transfer Tool  Maternal Diabetes: No Glucola 83 Genetic Screening: Normal Maternal Ultrasounds/Referrals: Small pocket of fetal pericardial effusion - isolated Fetal Ultrasounds or other Referrals:  None Maternal Substance Abuse:  No Significant Maternal Medications:  None Significant Maternal Lab Results:  None Other Comments:  None  ROS Negative other than per HPI  Blood pressure 124/61, pulse 55, temperature 98.1 F (36.7 C), temperature source Oral, resp. rate 18, height 5\' 5"  (1.651 m), weight 108.41 kg (239 lb), last menstrual period 12/25/2010, SpO2 100.00%, unknown if currently breastfeeding. Exam Physical Exam  GEN: very uncomfortable CV: slightly bradycardic but perfusing with 1+ bilateral DP pulses PULM: nl effort ABD: gravid CERVIX: 7/90/0-1 Tracing: 140s bpm,  accelerations present, moderate variability, variable decelerations, short strip due to quick delivery  Prenatal labs: ABO, Rh: --/--/O POS (09/11 0045) Antibody: POS (09/11 0045) Rubella: 76.6 (02/06 1445) RPR: NON REAC (06/27 1148)  HBsAg: NEGATIVE (02/06 1445)  HIV: NON REACTIVE (06/27 1148)  GBS:   Negative  Assessment/Plan: Pt is a 21 y.o. G2P1001 at [redacted]w[redacted]d here in active labor.  # Management of active labor - Admit to L&D - Epidural upon request - Anticipate NSVD   # Fetal wellbeing - GBS negative - Continue to monitor tracing until delivery - Category II tracing  # After-delivery management - Pt prefers to bottle feed, DMPA, and wants circumcision  Simone Curia 09/16/2011, 5:18 AM  I have seen and examined this patient and I agree with the above. Carol Blair, Carol Blair 7:15 AM 09/16/2011

## 2011-09-16 NOTE — MAU Note (Signed)
To L/D per stretcher

## 2011-09-16 NOTE — MAU Note (Signed)
Pt presented and taken to room # 6 with complaint of labor. Pt is very uncomfortable.

## 2011-09-16 NOTE — Progress Notes (Signed)
UR chart review completed.  

## 2011-09-17 ENCOUNTER — Other Ambulatory Visit: Payer: Medicaid Other

## 2011-09-17 NOTE — Progress Notes (Addendum)
Post Partum Day 1  Subjective: no complaints, up ad lib, voiding, tolerating PO and + flatus  Objective: Blood pressure 131/58, pulse 63, temperature 98.2 F (36.8 C), temperature source Oral, resp. rate 20, height 5\' 5"  (1.651 m), weight 108.41 kg (239 lb), last menstrual period 12/25/2010, SpO2 100.00%, unknown if currently breastfeeding.  Physical Exam:  General: alert, cooperative, appears stated age and no distress CV: 2+ bilateral DP pulses PULM: nl effort Lochia: appropriate Uterine Fundus: firm Incision: n/a DVT Evaluation: No evidence of DVT seen on physical exam. No significant calf/ankle edema.  Basename 09/16/11 0043  HGB 12.4  HCT 36.7    Assessment/Plan: Plan for discharge tomorrow and Contraception Depo shot - pt would like this upon discharge tomorrow from clinic   LOS: 1 day   Simone Curia 09/17/2011, 7:44 AM   I have seen and examined this patient and agree the above assessment. CRESENZO-DISHMAN,Ltanya Bayley 09/17/2011 7:57 AM

## 2011-09-18 ENCOUNTER — Ambulatory Visit: Payer: Medicaid Other

## 2011-09-18 MED ORDER — IBUPROFEN 600 MG PO TABS: 600.0000 mg | ORAL_TABLET | Freq: Four times a day (QID) | ORAL | Status: AC

## 2011-09-18 MED ORDER — INFLUENZA VIRUS VACC SPLIT PF IM SUSP
0.5000 mL | Freq: Once | INTRAMUSCULAR | Status: AC
  Administered 2011-09-18: 0.5 mL via INTRAMUSCULAR
  Filled 2011-09-18: qty 0.5

## 2011-09-18 NOTE — Discharge Summary (Signed)
Obstetric Discharge Summary Reason for Admission: onset of labor Prenatal Procedures: none Intrapartum Procedures: spontaneous vaginal delivery Postpartum Procedures: none Complications-Operative and Postpartum: none Hemoglobin  Date Value Range Status  09/16/2011 12.4  12.0 - 15.0 g/dL Final     HCT  Date Value Range Status  09/16/2011 36.7  36.0 - 46.0 % Final   Carol Blair is a now [id number] who presented in active labor. She delivered a liveborn female infant on 09/16/11 vaginally with no complications.  Post partum care was uncomplicated.  Pt was discharged on day 2- bottle feeding. Given depo prior to leaving.   Physical Exam:  General: alert, cooperative and no distress Lochia: appropriate Uterine Fundus: firm Incision: na  DVT Evaluation: No evidence of DVT seen on physical exam. No cords or calf tenderness. No significant calf/ankle edema.  Discharge Diagnoses: Term Pregnancy-delivered  Discharge Information: Date: 09/18/2011 Activity: pelvic rest Diet: routine Medications: PNV, Ibuprofen and Colace Condition: stable Instructions: refer to practice specific booklet Discharge to: home   Newborn Data: Live born female  Birth Weight: 6 lb 1.4 oz (2761 g) APGAR: 9, 9  Home with mother.  Rulon Abide 09/18/2011, 7:44 AM

## 2011-09-18 NOTE — Discharge Summary (Signed)
I have seen and examined patient and agree with above.  Napoleon Form, MD 09/18/2011 9:18 AM

## 2011-09-21 ENCOUNTER — Ambulatory Visit (INDEPENDENT_AMBULATORY_CARE_PROVIDER_SITE_OTHER): Payer: Medicaid Other | Admitting: *Deleted

## 2011-09-21 VITALS — BP 130/88 | HR 72 | Temp 98.2°F

## 2011-09-21 DIAGNOSIS — Z3049 Encounter for surveillance of other contraceptives: Secondary | ICD-10-CM

## 2011-09-21 MED ORDER — MEDROXYPROGESTERONE ACETATE 150 MG/ML IM SUSP
150.0000 mg | Freq: Once | INTRAMUSCULAR | Status: AC
  Administered 2011-09-21: 150 mg via INTRAMUSCULAR

## 2011-09-21 NOTE — Progress Notes (Signed)
Pt in for depo provera. Has not had intercourse since hospital discharge.

## 2011-09-21 NOTE — Addendum Note (Signed)
Addended by: Sherre Lain A on: 09/21/2011 02:28 PM   Modules accepted: Level of Service

## 2011-09-22 ENCOUNTER — Other Ambulatory Visit (HOSPITAL_COMMUNITY): Payer: Medicaid Other

## 2011-09-29 ENCOUNTER — Other Ambulatory Visit (HOSPITAL_COMMUNITY): Payer: Medicaid Other

## 2011-10-15 ENCOUNTER — Encounter: Payer: Self-pay | Admitting: Obstetrics & Gynecology

## 2011-10-15 ENCOUNTER — Ambulatory Visit (INDEPENDENT_AMBULATORY_CARE_PROVIDER_SITE_OTHER): Payer: Medicaid Other | Admitting: Obstetrics & Gynecology

## 2011-10-15 VITALS — BP 134/84 | HR 80 | Temp 97.6°F | Ht 65.0 in | Wt 226.5 lb

## 2011-10-15 DIAGNOSIS — R87811 Vaginal high risk human papillomavirus (HPV) DNA test positive: Secondary | ICD-10-CM

## 2011-10-15 DIAGNOSIS — IMO0002 Reserved for concepts with insufficient information to code with codable children: Secondary | ICD-10-CM

## 2011-10-15 NOTE — Progress Notes (Signed)
Patient ID: Carol Blair, female   DOB: [birth year], 21 y.o.   MRN: [id number] Subjective:     Carol Blair is a 21 y.o. female who presents for a postpartum visit. She is 1 month postpartum following a spontaneous vaginal delivery. I have fully reviewed the prenatal and intrapartum course. The delivery was at 38 gestational weeks. Outcome: spontaneous vaginal delivery. Anesthesia: none. Postpartum course has been normal. Baby's course has been normal. Baby is feeding by bottle. Bleeding staining only. Bowel function is normal. Bladder function is normal. Patient is sexually active. Contraception method is Depo-Provera injections. Postpartum depression screening: negative.  The following portions of the patient's history were reviewed and updated as appropriate: allergies, current medications, past family history, past medical history, past social history, past surgical history and problem list.  Review of Systems Pertinent items are noted in HPI.   Objective:    BP 134/84  Pulse 80  Temp 97.6 F (36.4 C)  Ht 5\' 5"  (1.651 m)  Wt 226 lb 8 oz (102.74 kg)  BMI 37.69 kg/m2  Breastfeeding? No  General:  alert, cooperative and no distress   Breasts:    Lungs:   Heart:    Abdomen: soft, non-tender; bowel sounds normal; no masses,  no organomegaly   Vulva:  not evaluated  Vagina: not evaluated  Cervix:    Corpus: not examined  Adnexa:  not evaluated  Rectal Exam: Not performed.        Assessment:    Normal postpartum exam. Pap smear not done at today's visit.   Plan:    1. Contraception: Depo-Provera injections 2. ASCUS pos HR HPV 02/2011 3. Follow up in: 4 months or as needed. For pap, 7 weeks for DMPA  Blair,Carol 10/15/2011 2:26 PM

## 2011-10-15 NOTE — Patient Instructions (Signed)
Abnormal Pap Test Information  During a Pap test, the cells on the surface of your cervix are checked to see if they look normal, abnormal, or if they show signs of having been altered by a certain type of virus called human papillomavirus, or HPV. Cervical cells that have been affected by HPV are called dysplasia. Dysplasia is not cancer, but describes abnormal cells found on the surface of the cervix. Depending on the degree of dysplasia, some of the cells may be considered pre-cancerous and may turn into cancer over time if follow up with a caregiver is delayed.   WHAT DOES AN ABNORMAL PAP TEST MEAN?  Having an abnormal pap test does not mean that you have cancer. However, certain types of abnormal pap tests can be a sign that a person is at a higher risk of developing cancer. Your caregiver will want to do other tests to find out more about the abnormal cells. Your abnormal Pap test results could show:   · Small and uncertain changes that should be carefully watched.    · Cervical dysplasia that has caused mild changes and can be followed over time.    · Cervical dysplasia that is more severe and needs to be followed and treated to ensure the problem goes away.  · Cancer.    When severe cervical dysplasia is found and treated early, it rarely will grow into cancer.   WHAT WILL BE DONE ABOUT MY ABNORMAL PAP TEST?  · A colposcopy may be needed. This is a procedure where your cervix is examined using light and magnification.  · A small tissue sample of your cervix (biopsy) may need to be removed and then examined. This is often performed if there are areas that appear infected.  · A sample of cells from the cervical canal may be removed with either a small brush or scraping instrument (curette).  Based on the results of the procedures above, some caregivers may recommend either cryotherapy of the cervix or a surgical LEEP where a portion of the cervix is removed. LEEP is short for "loop electrical excisional  procedure." Rarely, a caregiver may recommend a cone biopsy. This is a procedure where a small, cone-shaped sample of your cervix is taken out. The part that is taken out is the area where the abnormal cells are.    WHAT IF I HAVE A DYSPLASIA OR A CANCER?  You may be referred to a specialist. Radiation may also be a treatment for more advanced cancer. Having a hysterectomy is the last treatment option for dysplasia, but it is a more common treatment for someone with cancer. All treatment options will be discussed with you by your caregiver.  WHAT SHOULD YOU DO AFTER BEING TREATED?  If you have had an abnormal pap test, you should continue to have regular pap tests and check-ups as directed by your caregiver. Your cervical problem will be carefully watched so it does not get worse. Also, your caregiver can watch for, and treat, any new problems that may come up.  Document Released: 04/08/2010 Document Revised: 03/16/2011 Document Reviewed: 12/18/2010  ExitCare® Patient Information ©2013 [employer].

## 2011-12-11 ENCOUNTER — Ambulatory Visit (INDEPENDENT_AMBULATORY_CARE_PROVIDER_SITE_OTHER): Payer: Medicaid Other

## 2011-12-11 VITALS — BP 141/85 | HR 91 | Temp 97.1°F | Ht 65.0 in | Wt 232.2 lb

## 2011-12-11 DIAGNOSIS — Z3049 Encounter for surveillance of other contraceptives: Secondary | ICD-10-CM

## 2011-12-11 MED ORDER — MEDROXYPROGESTERONE ACETATE 150 MG/ML IM SUSP
150.0000 mg | Freq: Once | INTRAMUSCULAR | Status: AC
  Administered 2011-12-11: 150 mg via INTRAMUSCULAR

## 2012-01-21 ENCOUNTER — Emergency Department (HOSPITAL_COMMUNITY)
Admission: EM | Admit: 2012-01-21 | Discharge: 2012-01-21 | Disposition: A | Discharge status: Home / Self Care | Payer: Medicaid Other | Attending: Emergency Medicine | Admitting: Emergency Medicine

## 2012-01-21 ENCOUNTER — Emergency Department (HOSPITAL_COMMUNITY): Payer: Medicaid Other

## 2012-01-21 DIAGNOSIS — X500XXA Overexertion from strenuous movement or load, initial encounter: Secondary | ICD-10-CM | POA: Insufficient documentation

## 2012-01-21 DIAGNOSIS — I10 Essential (primary) hypertension: Secondary | ICD-10-CM | POA: Insufficient documentation

## 2012-01-21 DIAGNOSIS — Z79899 Other long term (current) drug therapy: Secondary | ICD-10-CM | POA: Insufficient documentation

## 2012-01-21 DIAGNOSIS — Y939 Activity, unspecified: Secondary | ICD-10-CM | POA: Insufficient documentation

## 2012-01-21 DIAGNOSIS — IMO0002 Reserved for concepts with insufficient information to code with codable children: Secondary | ICD-10-CM | POA: Insufficient documentation

## 2012-01-21 DIAGNOSIS — E669 Obesity, unspecified: Secondary | ICD-10-CM | POA: Insufficient documentation

## 2012-01-21 DIAGNOSIS — Y929 Unspecified place or not applicable: Secondary | ICD-10-CM | POA: Insufficient documentation

## 2012-01-21 DIAGNOSIS — S93402A Sprain of unspecified ligament of left ankle, initial encounter: Secondary | ICD-10-CM

## 2012-01-21 MED ORDER — IBUPROFEN 800 MG PO TABS
800.0000 mg | ORAL_TABLET | Freq: Once | ORAL | Status: AC
Start: 2012-01-21 — End: 2012-01-21
  Administered 2012-01-21: 800 mg via ORAL
  Filled 2012-01-21: qty 1

## 2012-01-21 MED ORDER — HYDROCODONE-ACETAMINOPHEN 5-325 MG PO TABS
1.0000 | ORAL_TABLET | Freq: Once | ORAL | Status: DC
  Filled 2012-01-21: qty 1

## 2012-01-21 MED ORDER — HYDROCODONE-ACETAMINOPHEN 5-325 MG PO TABS: 2.0000 | ORAL_TABLET | ORAL | Status: DC | PRN

## 2012-01-21 MED ORDER — IBUPROFEN 600 MG PO TABS: 600.0000 mg | ORAL_TABLET | Freq: Four times a day (QID) | ORAL | Status: DC | PRN

## 2012-01-21 NOTE — ED Provider Notes (Signed)
History     CSN: [id number]  Arrival date & time 01/21/12  8295   First MD Initiated Contact with Patient 01/21/12 0322      No chief complaint on file.   (Consider location/radiation/quality/duration/timing/severity/associated sxs/prior treatment) Patient is a 22 y.o. female presenting with ankle pain. The history is provided by the patient.  Ankle Pain  The incident occurred 6 to 12 hours ago. Injury mechanism: twisting. The pain is present in the left ankle. The quality of the pain is described as aching. The pain is at a severity of 6/10. The pain is moderate. The pain has been constant since onset. Associated symptoms include inability to bear weight. Pertinent negatives include no numbness. The symptoms are aggravated by bearing weight. She has tried nothing for the symptoms.    Past Medical History  Diagnosis Date  . Hypertension   . Obesity   . Benign essential hypertension antepartum 02/11/2011    Past Surgical History  Procedure Date  . Laparoscopic cholecystectomy w/ cholangiography 06/10/10    Dr Gaynelle Adu    Family History  Problem Relation Age of Onset  . Hypertension Sister   . Lupus Sister   . Diabetes Sister   . Cancer Maternal Grandmother     GI  . Cancer Maternal Aunt     Breast  . Cancer Maternal Uncle     colon    History  Substance Use Topics  . Smoking status: Never Smoker   . Smokeless tobacco: Never Used  . Alcohol Use: No    OB History    Grav Para Term Preterm Abortions TAB SAB Ect Mult Living   2 2 2       2       Review of Systems  Musculoskeletal: Positive for joint swelling.  Neurological: Negative for numbness.  All other systems reviewed and are negative.    Allergies  Review of patient's allergies indicates no known allergies.  Home Medications   Current Outpatient Rx  Name  Route  Sig  Dispense  Refill  . DIPHENHYDRAMINE HCL 25 MG PO TABS   Oral   Take 25 mg by mouth every 6 (six) hours as needed. allergies         . MEDROXYPROGESTERONE ACETATE 150 MG/ML IM SUSP   Intramuscular   Inject 150 mg into the muscle every 3 (three) months.         Marland Kitchen PRENATAL MULTIVITAMIN CH   Oral   Take 1 tablet by mouth daily.            BP 144/78  Pulse 82  Temp 98.4 F (36.9 C) (Oral)  Resp 16  SpO2 100%  Physical Exam  Constitutional: She is oriented to person, place, and time. She appears well-developed and well-nourished.  HENT:  Head: Normocephalic and atraumatic.  Eyes: Conjunctivae normal and EOM are normal. Pupils are equal, round, and reactive to light.  Neck: Normal range of motion.  Cardiovascular: Normal rate, regular rhythm and normal heart sounds.   Pulmonary/Chest: Effort normal and breath sounds normal.  Abdominal: Soft. Bowel sounds are normal.  Musculoskeletal: Normal range of motion.       Left lat malleolus tenderness to palpation  Neurological: She is alert and oriented to person, place, and time.  Skin: Skin is warm and dry.  Psychiatric: She has a normal mood and affect. Her behavior is normal.    ED Course  Procedures (including critical care time)  Labs Reviewed - No data to display  No results found.   No diagnosis found.    MDM  + left ankle injury. Will xray to r/o fracture,  reassess        Rosanne Ashing, MD 01/21/12 [phone]

## 2012-01-21 NOTE — ED Notes (Signed)
The patient says she rolled her ankle outward while trying to lift her child.

## 2012-02-26 ENCOUNTER — Ambulatory Visit: Payer: Medicaid Other

## 2012-02-29 ENCOUNTER — Ambulatory Visit (INDEPENDENT_AMBULATORY_CARE_PROVIDER_SITE_OTHER): Payer: Medicaid Other | Admitting: Obstetrics and Gynecology

## 2012-02-29 DIAGNOSIS — Z3049 Encounter for surveillance of other contraceptives: Secondary | ICD-10-CM

## 2012-02-29 MED ORDER — MEDROXYPROGESTERONE ACETATE 150 MG/ML IM SUSP
150.0000 mg | Freq: Once | INTRAMUSCULAR | Status: AC
  Administered 2012-02-29: 150 mg via INTRAMUSCULAR

## 2012-03-28 ENCOUNTER — Telehealth: Payer: Self-pay | Admitting: General Practice

## 2012-03-28 NOTE — Telephone Encounter (Signed)
Called patient back and stated I received her message about the depo dates and she got her shot on 2/24 and her window for the next one is 5/12/09/24. Patient verbalized understanding and had no further questions

## 2012-03-28 NOTE — Telephone Encounter (Signed)
Patient called and left message stating she wants to know when she got her depo shot and what her window for the next one is so she can go to her regular dr for it.

## 2012-05-20 ENCOUNTER — Ambulatory Visit (INDEPENDENT_AMBULATORY_CARE_PROVIDER_SITE_OTHER): Payer: Medicaid Other | Admitting: *Deleted

## 2012-05-20 DIAGNOSIS — Z309 Encounter for contraceptive management, unspecified: Secondary | ICD-10-CM

## 2012-05-20 MED ORDER — MEDROXYPROGESTERONE ACETATE 150 MG/ML IM SUSP
150.0000 mg | Freq: Once | INTRAMUSCULAR | Status: AC
  Administered 2012-05-20: 150 mg via INTRAMUSCULAR

## 2012-05-20 NOTE — Progress Notes (Signed)
Pt here for depo. Depo given in left deltoid as requested by pt - pt tolerated well. Next depo due Jul 31-aug 8 - reminder card given. [name], RN-BSN

## 2012-08-04 ENCOUNTER — Other Ambulatory Visit: Payer: Medicaid Other

## 2012-08-04 ENCOUNTER — Ambulatory Visit (INDEPENDENT_AMBULATORY_CARE_PROVIDER_SITE_OTHER): Payer: Medicaid Other | Admitting: *Deleted

## 2012-08-04 DIAGNOSIS — Z309 Encounter for contraceptive management, unspecified: Secondary | ICD-10-CM

## 2012-08-04 MED ORDER — MEDROXYPROGESTERONE ACETATE 150 MG/ML IM SUSP
150.0000 mg | Freq: Once | INTRAMUSCULAR | Status: AC
  Administered 2012-08-04: 150 mg via INTRAMUSCULAR

## 2012-08-04 NOTE — Progress Notes (Signed)
Pt here for depo. Depo given RUOQ. Next depo due oct 16-30 [name], RN-BSN

## 2012-10-20 ENCOUNTER — Encounter: Payer: Medicaid Other | Admitting: Family Medicine

## 2012-10-24 ENCOUNTER — Ambulatory Visit: Payer: Medicaid Other

## 2012-10-24 ENCOUNTER — Ambulatory Visit: Payer: Medicaid Other | Admitting: Family Medicine

## 2012-10-24 ENCOUNTER — Encounter: Payer: Medicaid Other | Admitting: Family Medicine

## 2012-10-24 ENCOUNTER — Ambulatory Visit (INDEPENDENT_AMBULATORY_CARE_PROVIDER_SITE_OTHER): Payer: Medicaid Other | Admitting: *Deleted

## 2012-10-24 DIAGNOSIS — Z23 Encounter for immunization: Secondary | ICD-10-CM

## 2012-10-24 DIAGNOSIS — Z309 Encounter for contraceptive management, unspecified: Secondary | ICD-10-CM

## 2012-10-24 MED ORDER — MEDROXYPROGESTERONE ACETATE 150 MG/ML IM SUSP
150.0000 mg | Freq: Once | INTRAMUSCULAR | Status: AC
  Administered 2012-10-24: 150 mg via INTRAMUSCULAR

## 2012-10-24 NOTE — Progress Notes (Signed)
Patient in today for depo. Injection given in right deltoid, patient without complains. Next depo due January 5 - January 19, patient aware.

## 2012-11-02 ENCOUNTER — Telehealth: Payer: Self-pay | Admitting: *Deleted

## 2012-11-02 NOTE — Telephone Encounter (Signed)
Pt left message that she was seen by our office last year for pregnancy and delivery of baby. She is now seeing another physician and states they cannot access her file. She is starting a new job and wants to know if she had Tetanus, TB, MMR and physical exam while receiving care for her pregnancy. I reviewed chart and called pt. I stated that she did receive Tdap vaccine, however not the TB or MMR. I also stated that since our doctors are a specialty practice, they would not perform head to toe complete physical exam.  Pt voiced understanding.

## 2012-11-08 ENCOUNTER — Ambulatory Visit (INDEPENDENT_AMBULATORY_CARE_PROVIDER_SITE_OTHER): Payer: Medicaid Other | Admitting: Family Medicine

## 2012-11-08 ENCOUNTER — Encounter: Payer: Self-pay | Admitting: Family Medicine

## 2012-11-08 VITALS — BP 137/80 | HR 68 | Resp 12 | Ht 65.0 in | Wt 243.0 lb

## 2012-11-08 DIAGNOSIS — I10 Essential (primary) hypertension: Secondary | ICD-10-CM

## 2012-11-08 DIAGNOSIS — IMO0002 Reserved for concepts with insufficient information to code with codable children: Secondary | ICD-10-CM

## 2012-11-08 DIAGNOSIS — Z0000 Encounter for general adult medical examination without abnormal findings: Secondary | ICD-10-CM

## 2012-11-08 DIAGNOSIS — R6889 Other general symptoms and signs: Secondary | ICD-10-CM

## 2012-11-08 LAB — CBC
MCV: 88.4 fL (ref 78.0–100.0)
Platelets: 314 10*3/uL (ref 150–400)
RBC: 4.3 MIL/uL (ref 3.87–5.11)
RDW: 14.2 % (ref 11.5–15.5)
WBC: 6.1 10*3/uL (ref 4.0–10.5)

## 2012-11-08 LAB — POCT URINALYSIS DIPSTICK
Bilirubin, UA: NEGATIVE
Glucose, UA: NEGATIVE
Nitrite, UA: NEGATIVE
Urobilinogen, UA: 1

## 2012-11-08 LAB — BASIC METABOLIC PANEL
Chloride: 107 mEq/L (ref 96–112)
Potassium: 3.8 mEq/L (ref 3.5–5.3)
Sodium: 142 mEq/L (ref 135–145)

## 2012-11-08 LAB — POCT UA - MICROSCOPIC ONLY

## 2012-11-08 NOTE — Assessment & Plan Note (Signed)
Has been non HTN since her pregnancy, most likely secondary to [facility], not chronic HTN.  Will get laboratory evaluation today to evaluate for proteinuria or kidney injury along with blood line abnormality.  F/U in 1 yr or sooner PRN.

## 2012-11-08 NOTE — Patient Instructions (Signed)
Aaima, it was nice seeing you today.  Please make an appointment for the next 1-2 months for a Pap Smear.  Thanks, Dr. Paulina Fusi

## 2012-11-08 NOTE — Progress Notes (Signed)
Carol Blair is a 22 y.o. female who presents today for well check as well as possible HTN  HTN - Pt with family Hx of HTN and gestational HTN that she was on medications for at that time but not after.  She denies any palpitations, HA, blurred vision, edema, polyuria.   She is currently a non smoker, non drinker.  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  Does have about a 1 y/o infant at home and unsure of last chlamydia/GC check.   Past Medical History  Diagnosis Date  . Hypertension   . Obesity   . Benign essential hypertension antepartum 02/11/2011    History   Social History  . Marital Status: Single    Spouse Name: N/A    Number of Children: N/A  . Years of Education: N/A   Occupational History  . Not on file.   Social History Main Topics  . Smoking status: Never Smoker   . Smokeless tobacco: Never Used  . Alcohol Use: No  . Drug Use: No  . Sexual Activity: Not Currently    Birth Control/ Protection: Injection   Other Topics Concern  . Not on file   Social History Narrative  . No narrative on file    Family History  Problem Relation Age of Onset  . Hypertension Sister   . Lupus Sister   . Diabetes Sister   . Cancer Maternal Grandmother     GI  . Cancer Maternal Aunt     Breast  . Cancer Maternal Uncle     colon    Current Outpatient Prescriptions on File Prior to Visit  Medication Sig Dispense Refill  . medroxyPROGESTERone (DEPO-PROVERA) 150 MG/ML injection Inject 150 mg into the muscle every 3 (three) months.       No current facility-administered medications on file prior to visit.    Patient Information Form: Screening and ROS  AUDIT-C Score: 0 Do you feel safe in relationships? yes PHQ-2:negative  Review of Symptoms  General:  Negative for nexplained weight loss, fever Skin: Negative for new or changing mole, sore that won't heal HEENT: Negative for trouble hearing, trouble seeing, ringing in ears, mouth sores,  hoarseness, change in voice, dysphagia. CV:  Negative for chest pain, dyspnea, edema, palpitations Resp: Negative for cough, dyspnea, hemoptysis GI: Negative for nausea, vomiting, diarrhea, constipation, abdominal pain, melena, hematochezia. GU: Negative for dysuria, incontinence, urinary hesitance, hematuria, vaginal or penile discharge, polyuria, sexual difficulty, lumps in testicle or breasts MSK: Negative for muscle cramps or aches, joint pain or swelling Neuro: Negative for headaches, weakness, numbness, dizziness, passing out/fainting Psych: Negative for depression, anxiety, memory problems  Physical Exam Filed Vitals:   11/08/12 1603  BP: 137/80  Pulse: 68  Resp: 12    Gen: NAD, Well nourished, Well developed HEENT: PERLA, EOMI, Roberts/AT Neck: no JVD Cardio: RRR, No murmurs Lungs: CTA, no wheezes, rhonchi, crackles Abd: NABS, soft nontender nondistended MSK: ROM normal  Neuro: CN 2-12 intact, MS 5/5 B/L UE and LE, +2 patellar and achilles relfex b/l  Psych: AAO x 3

## 2012-11-08 NOTE — Addendum Note (Signed)
Addended by: [place], Luvada Salamone on: 11/08/2012 05:14 PM   Modules accepted: Orders

## 2012-11-08 NOTE — Assessment & Plan Note (Addendum)
Will need repeat Pap Smear as she previously had ASCUS in 2013.  Recommend f/u for specific pap in the next 1-2 months.  Also consider GC/Chlamydia at that time too.

## 2012-11-09 LAB — MEASLES/MUMPS/RUBELLA IMMUNITY
Mumps IgG: 118 AU/mL — ABNORMAL HIGH (ref <9.00)
Rubella: 5.83 Index — ABNORMAL HIGH (ref <0.90)
Rubeola IgG: >300 AU/mL — ABNORMAL HIGH (ref <25.00)

## 2012-11-09 LAB — HEPATITIS B SURFACE ANTIBODY, QUANTITATIVE: Hepatitis B-Post: 355 m[IU]/mL

## 2012-11-10 ENCOUNTER — Telehealth: Payer: Self-pay

## 2012-11-10 NOTE — Telephone Encounter (Signed)
Discussed results from her lab work.  Pt needs her papers filled out for work with printed copy of MMR/Hep B laboratory results from 11/08/12 attached to this.  I will sign off on these forms when I am in clinic tomorrow but could be we please print these off in the meantime?  Thanks, Twana First. Paulina Fusi, DO of [facility] 11/10/2012, 12:44 PM

## 2012-11-10 NOTE — Telephone Encounter (Signed)
Please advise. Carol Blair S  

## 2012-11-10 NOTE — Telephone Encounter (Signed)
Patient calls wanting lab results from OV on 11/4. Please advise.

## 2013-01-12 ENCOUNTER — Ambulatory Visit: Payer: Medicaid Other | Admitting: Family Medicine

## 2013-01-17 ENCOUNTER — Ambulatory Visit: Payer: Medicaid Other | Admitting: Family Medicine

## 2013-01-23 ENCOUNTER — Ambulatory Visit (INDEPENDENT_AMBULATORY_CARE_PROVIDER_SITE_OTHER): Payer: Medicaid Other | Admitting: *Deleted

## 2013-01-23 DIAGNOSIS — Z309 Encounter for contraceptive management, unspecified: Secondary | ICD-10-CM

## 2013-01-23 MED ORDER — MEDROXYPROGESTERONE ACETATE 150 MG/ML IM SUSP
150.0000 mg | Freq: Once | INTRAMUSCULAR | Status: AC
  Administered 2013-01-23: 150 mg via INTRAMUSCULAR

## 2013-01-23 NOTE — Progress Notes (Signed)
Patient in today for depo. Injection given in left deltoid, patient without complaints, site unremarkable. Next depo due April 6 - April 20, patient aware.

## 2013-04-10 ENCOUNTER — Ambulatory Visit: Payer: Medicaid Other | Admitting: Family Medicine

## 2013-04-24 ENCOUNTER — Telehealth: Payer: Self-pay | Admitting: Family Medicine

## 2013-04-24 NOTE — Telephone Encounter (Signed)
Pt can start taking combined OCP if she would like.  Please find out of this is amenable to her.  Thanks,  Twana FirstBryan R. Paulina FusiHess, DO [facility] 04/24/2013, 2:21 PM

## 2013-04-24 NOTE — Telephone Encounter (Signed)
Please advise.Thank you.Carol Blair S Carol Blair  

## 2013-04-24 NOTE — Telephone Encounter (Signed)
Pt called and would like Dr. Paulina FusiHess to prescribe a medication that will dry up her breast milk. Jw

## 2013-04-25 NOTE — Telephone Encounter (Signed)
Left detailed message on voicemail.Carol GoryGiovanna Blair Carol Blair

## 2013-04-28 ENCOUNTER — Ambulatory Visit: Payer: Medicaid Other | Admitting: Family Medicine

## 2013-05-10 ENCOUNTER — Encounter: Payer: Self-pay | Admitting: Family Medicine

## 2013-05-10 ENCOUNTER — Ambulatory Visit (INDEPENDENT_AMBULATORY_CARE_PROVIDER_SITE_OTHER): Payer: Medicaid Other | Admitting: Family Medicine

## 2013-05-10 ENCOUNTER — Other Ambulatory Visit (HOSPITAL_COMMUNITY)
Admission: RE | Admit: 2013-05-10 | Discharge: 2013-05-10 | Disposition: A | Discharge status: Home / Self Care | Payer: Medicaid Other | Source: Ambulatory Visit | Attending: Family Medicine | Admitting: Family Medicine

## 2013-05-10 VITALS — BP 147/87 | HR 100 | Temp 99.2°F | Ht 65.0 in | Wt 251.1 lb

## 2013-05-10 DIAGNOSIS — N643 Galactorrhea not associated with childbirth: Secondary | ICD-10-CM

## 2013-05-10 DIAGNOSIS — Z01419 Encounter for gynecological examination (general) (routine) without abnormal findings: Secondary | ICD-10-CM | POA: Insufficient documentation

## 2013-05-10 DIAGNOSIS — IMO0002 Reserved for concepts with insufficient information to code with codable children: Secondary | ICD-10-CM

## 2013-05-10 DIAGNOSIS — R6889 Other general symptoms and signs: Secondary | ICD-10-CM

## 2013-05-10 LAB — POCT URINE PREGNANCY: Preg Test, Ur: NEGATIVE

## 2013-05-10 MED ORDER — NORGESTIMATE-ETH ESTRADIOL 0.25-35 MG-MCG PO TABS: 1.0000 | ORAL_TABLET | Freq: Every day | ORAL | Status: DC

## 2013-05-10 NOTE — Progress Notes (Signed)
Carol Blair is a 23 y.o. female who presents today for pap smear and Birth control counseling.  Pap smear - previous Hx of ASCUS with high risk HPV in 2012, no f/u.  Denies any discharge, smoking hx.  Birth control - Thinks she is having galactorrhea from Depo.  Otherwise, working well for her, would like to try something else.  No other concerns.  Denies Hx of DVT, PE, smoking hx.   Past Medical History  Diagnosis Date  . Hypertension   . Obesity   . Benign essential hypertension antepartum 02/11/2011    History  Smoking status  . Never Smoker   Smokeless tobacco  . Never Used    Family History  Problem Relation Age of Onset  . Hypertension Sister   . Lupus Sister   . Diabetes Sister   . Cancer Maternal Grandmother     GI  . Cancer Maternal Aunt     Breast  . Cancer Maternal Uncle     colon    Current Outpatient Prescriptions on File Prior to Visit  Medication Sig Dispense Refill  . medroxyPROGESTERone (DEPO-PROVERA) 150 MG/ML injection Inject 150 mg into the muscle every 3 (three) months.       No current facility-administered medications on file prior to visit.    ROS: Per HPI.  All other systems reviewed and are negative.   Physical Exam Filed Vitals:   05/10/13 1123  BP: 147/87  Pulse: 100  Temp: 99.2 F (37.3 C)    Physical Examination: General appearance - alert, well appearing, and in no distress Chest - clear to auscultation, no wheezes, rales or rhonchi, symmetric air entry Heart - normal rate and regular rhythm, no murmurs noted GU: Normal appearing external genitalia, no CMT, no Adnexal tenderness, no friability of cervix.

## 2013-05-10 NOTE — Patient Instructions (Signed)
Oral Contraception Information  Oral contraceptive pills (OCPs) are medicines taken to prevent pregnancy. OCPs work by preventing the ovaries from releasing eggs. The hormones in OCPs also cause the cervical mucus to thicken, preventing the sperm from entering the uterus. The hormones also cause the uterine lining to become thin, not allowing a fertilized egg to attach to the inside of the uterus. OCPs are highly effective when taken exactly as prescribed. However, OCPs do not prevent sexually transmitted diseases (STDs). Safe sex practices, such as using condoms along with the pill, can help prevent STDs.   Before taking the pill, you may have a physical exam and Pap test. Your health care provider may order blood tests. The health care provider will make sure you are a good candidate for oral contraception. Discuss with your health care provider the possible side effects of the OCP you may be prescribed. When starting an OCP, it can take 2 to 3 months for the body to adjust to the changes in hormone levels in your body.   TYPES OF ORAL CONTRACEPTION  · The combination pill This pill contains estrogen and progestin (synthetic progesterone) hormones. The combination pill comes in 21-day, 28-day, or 91-day packs. Some types of combination pills are meant to be taken continuously (365-day pills). With 21-day packs, you do not take pills for 7 days after the last pill. With 28-day packs, the pill is taken every day. The last 7 pills are without hormones. Certain types of pills have more than 21 hormone-containing pills. With 91-day packs, the first 84 pills contain both hormones, and the last 7 pills contain no hormones or contain estrogen only.  · The minipill This pill contains the progesterone hormone only. The pill is taken every day continuously. It is very important to take the pill at the same time each day. The minipill comes in packs of 28 pills. All 28 pills contain the hormone.    ADVANTAGES OF ORAL  CONTRACEPTIVE PILLS  · Decreases premenstrual symptoms.    · Treats menstrual period cramps.    · Regulates the menstrual cycle.    · Decreases a heavy menstrual flow.    · May treat acne, depending on the type of pill.    · Treats abnormal uterine bleeding.    · Treats polycystic ovarian syndrome.    · Treats endometriosis.    · Can be used as emergency contraception.    THINGS THAT CAN MAKE ORAL CONTRACEPTIVE PILLS LESS EFFECTIVE  OCPs can be less effective if:   · You forget to take the pill at the same time every day.    · You have a stomach or intestinal disease that lessens the absorption of the pill.    · You take OCPs with other medicines that make OCPs less effective, such as antibiotics, certain HIV medicines, and some seizure medicines.    · You take expired OCPs.    · You forget to restart the pill on day 7, when using the packs of 21 pills.    RISKS ASSOCIATED WITH ORAL CONTRACEPTIVE PILLS   Oral contraceptive pills can sometimes cause side effects, such as:  · Headache.  · Nausea.  · Breast tenderness.  · Irregular bleeding or spotting.  Combination pills are also associated with a small increased risk of:  · Blood clots.  · Heart attack.  · Stroke.  Document Released: 03/14/2002 Document Revised: 10/12/2012 Document Reviewed: 06/12/2012  ExitCare® Patient Information ©2014 [employer].

## 2013-05-10 NOTE — Assessment & Plan Note (Signed)
Pap smear today, f/u with results in 2-3 months.

## 2013-05-10 NOTE — Assessment & Plan Note (Addendum)
Possible SE for depo, will stop Depo and start sprintec for now.  If continues, consider prolactin level.  As well, U preg to r/o pregnancy

## 2013-05-22 ENCOUNTER — Telehealth: Payer: Self-pay | Admitting: Family Medicine

## 2013-05-22 NOTE — Telephone Encounter (Signed)
Pt called and would like her pap smear results. jw

## 2013-05-23 NOTE — Telephone Encounter (Signed)
Relayed message,patient voiced understanding.Christia Coaxum S Braedyn Kauk  

## 2013-05-23 NOTE — Telephone Encounter (Signed)
Please let pt know her PAP was normal.  Thanks, Twana FirstBryan R. Paulina FusiHess, DO [facility] 05/23/2013, 8:31 AM

## 2013-10-25 ENCOUNTER — Ambulatory Visit (INDEPENDENT_AMBULATORY_CARE_PROVIDER_SITE_OTHER): Payer: Medicaid Other | Admitting: *Deleted

## 2013-10-25 DIAGNOSIS — Z3042 Encounter for surveillance of injectable contraceptive: Secondary | ICD-10-CM

## 2013-10-25 DIAGNOSIS — Z23 Encounter for immunization: Secondary | ICD-10-CM

## 2013-10-25 LAB — POCT URINE PREGNANCY: PREG TEST UR: NEGATIVE

## 2013-10-25 MED ORDER — MEDROXYPROGESTERONE ACETATE 150 MG/ML IM SUSP
150.0000 mg | Freq: Once | INTRAMUSCULAR | Status: AC
  Administered 2013-10-25: 150 mg via INTRAMUSCULAR

## 2013-10-25 NOTE — Progress Notes (Signed)
  Pt latefor Depo Provera injection.  Pt stated she was off the Depo Provera injection and started on the pills.  Pt stopped the pills and now wants to restart Depo.  Pregnancy test ordered; results negative.  Pt stated last sex was the first week of October.  Verbal order for Depo Provera 150 mg IM every three months per Dr. Paulina FusiHess given.  Pt advised to use another reliable form of birth control for the next 7-10 day.  Pt tolerated Depo injection. Depo given left upper outer quadrant.  Next injection due Jan 6-Jan 24, 2014.  Reminder card given. Clovis PuMartin, Tamika L, RN

## 2014-01-10 ENCOUNTER — Ambulatory Visit (INDEPENDENT_AMBULATORY_CARE_PROVIDER_SITE_OTHER): Payer: Medicaid Other | Admitting: *Deleted

## 2014-01-10 DIAGNOSIS — Z3042 Encounter for surveillance of injectable contraceptive: Secondary | ICD-10-CM | POA: Diagnosis present

## 2014-01-10 MED ORDER — MEDROXYPROGESTERONE ACETATE 150 MG/ML IM SUSP
150.0000 mg | Freq: Once | INTRAMUSCULAR | Status: AC
  Administered 2014-01-10: 150 mg via INTRAMUSCULAR

## 2014-01-10 NOTE — Progress Notes (Signed)
    Pt in for Depo Provera injection.  Pt tolerated Depo injection. Depo given right upper outer quadrant.  Next injection due March 24-April 12, 2014.  Reminder card given. Clovis PuMartin, Ysidro Ramsay L, RN

## 2014-02-05 ENCOUNTER — Ambulatory Visit: Payer: Medicaid Other

## 2014-08-07 ENCOUNTER — Ambulatory Visit: Payer: Medicaid Other | Attending: Internal Medicine

## 2014-10-24 ENCOUNTER — Emergency Department (HOSPITAL_COMMUNITY)
Admission: EM | Admit: 2014-10-24 | Discharge: 2014-10-24 | Disposition: A | Discharge status: Home / Self Care | Payer: No Typology Code available for payment source | Attending: Emergency Medicine | Admitting: Emergency Medicine

## 2014-10-24 ENCOUNTER — Encounter (HOSPITAL_COMMUNITY): Payer: Self-pay | Admitting: *Deleted

## 2014-10-24 ENCOUNTER — Emergency Department (HOSPITAL_COMMUNITY): Payer: No Typology Code available for payment source

## 2014-10-24 DIAGNOSIS — S91332A Puncture wound without foreign body, left foot, initial encounter: Secondary | ICD-10-CM | POA: Insufficient documentation

## 2014-10-24 DIAGNOSIS — E669 Obesity, unspecified: Secondary | ICD-10-CM | POA: Insufficient documentation

## 2014-10-24 DIAGNOSIS — Y92481 Parking lot as the place of occurrence of the external cause: Secondary | ICD-10-CM | POA: Insufficient documentation

## 2014-10-24 DIAGNOSIS — S99922A Unspecified injury of left foot, initial encounter: Secondary | ICD-10-CM | POA: Diagnosis present

## 2014-10-24 DIAGNOSIS — Y998 Other external cause status: Secondary | ICD-10-CM | POA: Insufficient documentation

## 2014-10-24 DIAGNOSIS — Y288XXA Contact with other sharp object, undetermined intent, initial encounter: Secondary | ICD-10-CM | POA: Diagnosis not present

## 2014-10-24 DIAGNOSIS — I10 Essential (primary) hypertension: Secondary | ICD-10-CM | POA: Diagnosis not present

## 2014-10-24 DIAGNOSIS — Z79899 Other long term (current) drug therapy: Secondary | ICD-10-CM | POA: Diagnosis not present

## 2014-10-24 DIAGNOSIS — Y9389 Activity, other specified: Secondary | ICD-10-CM | POA: Insufficient documentation

## 2014-10-24 MED ORDER — TETANUS-DIPHTH-ACELL PERTUSSIS 5-2.5-18.5 LF-MCG/0.5 IM SUSP: 0.5000 mL | Freq: Once | INTRAMUSCULAR | Status: DC

## 2014-10-24 NOTE — ED Notes (Addendum)
PT reports stepping on a sharp object after MN . Pt reports pulling a sharp object out of shoe. Pt reports the object went thur show and cut foot.

## 2014-10-24 NOTE — Discharge Instructions (Signed)
Read the information below.  You may return to the Emergency Department at any time for worsening condition or any new symptoms that concern you.  Keep the wound clean and covered.  If you develop redness, swelling, pus draining from the wound, or fevers greater than 100.4, return to the ER immediately for a recheck.     Puncture Wound A puncture wound is an injury that is caused by a sharp, thin object that goes through (penetrates) your skin. Usually, a puncture wound does not leave a large opening in your skin, so it may not bleed a lot. However, when you get a puncture wound, dirt or other materials (foreign bodies) can be forced into your wound and break off inside. This increases the chance of infection, such as tetanus. CAUSES Puncture wounds are caused by any sharp, thin object that goes through your skin, such as:  Animal teeth, as with an animal bite.  Sharp, pointed objects, such as nails, splinters of glass, fishhooks, and needles. SYMPTOMS Symptoms of a puncture wound include:  Pain.  Bleeding.  Swelling.  Bruising.  Fluid leaking from the wound.  Numbness, tingling, or loss of function. DIAGNOSIS This condition is diagnosed with a medical history and physical exam. Your wound will be checked to see if it contains any foreign bodies. You may also have X-rays or other imaging tests. TREATMENT Treatment for a puncture wound depends on how serious the wound is. It also depends on whether the wound contains any foreign bodies. Treatment for all types of puncture wounds usually starts with:  Controlling the bleeding.  Washing out the wound with a germ-free (sterile) salt-water solution.  Checking the wound for foreign bodies. Treatment may also include:  Having the wound opened surgically to remove a foreign object.  Closing the wound with stitches (sutures) if it continues to bleed.  Covering the wound with antibiotic ointments and a bandage (dressing).  Receiving  a tetanus shot.  Receiving a rabies vaccine. HOME CARE INSTRUCTIONS Medicines  Take or apply over-the-counter and prescription medicines only as told by your health care provider.  If you were prescribed an antibiotic, take or apply it as told by your health care provider. Do not stop using the antibiotic even if your condition improves. Wound Care  There are many ways to close and cover a wound. For example, a wound can be covered with sutures, skin glue, or adhesive strips. Follow instructions from your health care provider about:  How to take care of your wound.  When and how you should change your dressing.  When you should remove your dressing.  Removing whatever was used to close your wound.  Keep the dressing dry as told by your health care provider. Do not take baths, swim, use a hot tub, or do anything that would put your wound underwater until your health care provider approves.  Clean the wound as told by your health care provider.  Do not scratch or pick at the wound.  Check your wound every day for signs of infection. Watch for:  Redness, swelling, or pain.  Fluid, blood, or pus. General Instructions  Raise (elevate) the injured area above the level of your heart while you are sitting or lying down.  If your puncture wound is in your foot, ask your health care provider if you need to avoid putting weight on your foot and for how long.  Keep all follow-up visits as told by your health care provider. This is important. SEEK MEDICAL  CARE IF:  You received a tetanus shot and you have swelling, severe pain, redness, or bleeding at the injection site.  You have a fever.  Your sutures come out.  You notice a bad smell coming from your wound or your dressing.  You notice something coming out of your wound, such as wood or glass.  Your pain is not controlled with medicine.  You have increased redness, swelling, or pain at the site of your wound.  You have  fluid, blood, or pus coming from your wound.  You notice a change in the color of your skin near your wound.  You need to change the dressing frequently due to fluid, blood, or pus draining from your wound.  You develop a new rash.  You develop numbness around your wound. SEEK IMMEDIATE MEDICAL CARE IF:  You develop severe swelling around your wound.  Your pain suddenly increases and is severe.  You develop painful skin lumps.  You have a red streak going away from your wound.  The wound is on your hand or foot and you cannot properly move a finger or toe.  The wound is on your hand or foot and you notice that your fingers or toes look pale or bluish.   This information is not intended to replace advice given to you by your health care provider. Make sure you discuss any questions you have with your health care provider.   Document Released: 10/01/2004 Document Revised: 09/12/2014 Document Reviewed: 02/14/2014 Elsevier Interactive Patient Education [employer]

## 2014-10-24 NOTE — ED Notes (Signed)
Declined W/C at D/C and was escorted to lobby by RN. 

## 2014-10-24 NOTE — ED Provider Notes (Signed)
CSN: [id number]     Arrival date & time 10/24/14  [id number] History   By signing my name below, I, Essence Howell, attest that this documentation has been prepared under the direction and in the presence of Trixie DredgeEmily Quintavious Rinck, PA-C Electronically Signed: Charline BillsEssence Howell, ED Scribe 10/24/2014 at 11:14 AM.   Chief Complaint  Patient presents with  . Foot Injury   The history is provided by the patient and a friend. No language interpreter was used.   HPI Comments: Carol Blair is a 24 y.o. female who presents to the Emergency Department complaining of a foot injury sustained last night. Pt states that she stepped on a sharp object last night while walking through the hospital parking lot. She reports that she was able to pull a piece of metal out of her flip flop but the object did puncture the plantar aspect of her left foot. She also reports a stinging sensation that she attributes to touching the area. Pt states that she was able to wash her foot with warm water following the incident. She denies increased pain with bearing weight and numbness. Last tetanus is unknown.  Denies weakness or numbness of the foot.  Denies other injury.    Past Medical History  Diagnosis Date  . Hypertension   . Obesity   . Benign essential hypertension antepartum 02/11/2011   Past Surgical History  Procedure Laterality Date  . Laparoscopic cholecystectomy w/ cholangiography  06/10/10    Dr Gaynelle AduEric Wilson   Family History  Problem Relation Age of Onset  . Hypertension Sister   . Lupus Sister   . Diabetes Sister   . Cancer Maternal Grandmother     GI  . Cancer Maternal Aunt     Breast  . Cancer Maternal Uncle     colon   Social History  Substance Use Topics  . Smoking status: Never Smoker   . Smokeless tobacco: Never Used  . Alcohol Use: No   OB History    Gravida Para Term Preterm AB TAB SAB Ectopic Multiple Living   2 2 2       2      Review of Systems  Constitutional: Negative for fever and chills.   Musculoskeletal: Negative for myalgias.  Skin: Positive for wound. Negative for color change.  Allergic/Immunologic: Negative for immunocompromised state.  Neurological: Negative for weakness and numbness.  Psychiatric/Behavioral: Negative for self-injury.   Allergies  Review of patient's allergies indicates no known allergies.  Home Medications   Prior to Admission medications   Medication Sig Start Date End Date Taking? Authorizing Provider  norgestimate-ethinyl estradiol (ORTHO-CYCLEN,SPRINTEC,PREVIFEM) 0.25-35 MG-MCG tablet Take 1 tablet by mouth daily. 05/10/13   Bryan R Hess, DO   BP 146/80 mmHg  Pulse 62  Temp(Src) 99 F (37.2 C) (Oral)  Resp 16  SpO2 100%  LMP 10/17/2014 Physical Exam  Constitutional: She appears well-developed and well-nourished. No distress.  HENT:  Head: Normocephalic and atraumatic.  Neck: Neck supple.  Pulmonary/Chest: Effort normal.  Musculoskeletal:  Small healing laceration on plantar aspect of L foot. No palpable foreign body. No erythema, edema, warmth or discharge. No significant tenderness. Moves all toes without difficulty. Sensation intact.  Neurological: She is alert.  Skin: She is not diaphoretic.  Nursing note and vitals reviewed.  ED Course  Procedures (including critical care time) DIAGNOSTIC STUDIES: Oxygen Saturation is 100% on RA, normal by my interpretation.    COORDINATION OF CARE: 10:29 AM-Discussed treatment plan which includes XR and tdap with  pt at bedside and pt agreed to plan.   Labs Review Labs Reviewed - No data to display  Imaging Review Dg Foot 2 Views Left  10/24/2014  CLINICAL DATA:  Evaluate for foreign body EXAM: LEFT FOOT - 2 VIEW COMPARISON:  None. FINDINGS: There is no evidence of fracture or dislocation. There is an old ununited fracture of the tip of the lateral malleolus. There is no evidence of arthropathy or other focal bone abnormality. No radiopaque foreign body. No soft tissue emphysema.  IMPRESSION: No evidence of a radiopaque foreign body in the left foot. Electronically Signed   By: Elige Ko   On: 10/24/2014 11:09   I have personally reviewed and evaluated these images and lab results as part of my medical decision-making.   EKG Interpretation None       Per Up to Date, routine wound care to be followed.  No antibiotics required.   MDM   Final diagnoses:  Puncture wound of foot, left, initial encounter    Afebrile, nontoxic patient with tiny superficial puncture wound on the plantar aspect of left foot.  No e/o infection.  Xray negative for foreign body.   D/C home with wound care instructions, return precautions.  Discussed result, findings, treatment, and follow up  with patient.  Pt given return precautions.  Pt verbalizes understanding and agrees with plan.        I personally performed the services described in this documentation, which was scribed in my presence. The recorded information has been reviewed and is accurate.    Trixie Dredge, PA-C 10/24/14 1303  Gilda Crease, MD 10/25/14 [phone]

## 2015-01-06 NOTE — L&D Delivery Note (Signed)
Delivery Note At 2:21 AM a viable female was delivered via Vaginal, Spontaneous Delivery (Presentation: LOA  ).  APGAR:8,9; weight pending at time of note.   Placenta status: spontaneously delivered at 2:28 AM, intact 3VC .   Anesthesia: Local   Lacerations:  2nd degree Suture Repair: 3.0 vicryl Est. Blood Loss (mL):  300  Mom to postpartum.  Baby to Couplet care / Skin to Skin.  Jaynie CollinsUgonna Paxon Propes, MD

## 2015-04-02 ENCOUNTER — Emergency Department (HOSPITAL_COMMUNITY)
Admission: EM | Admit: 2015-04-02 | Discharge: 2015-04-02 | Disposition: A | Discharge status: Home / Self Care | Payer: No Typology Code available for payment source | Attending: Emergency Medicine | Admitting: Emergency Medicine

## 2015-04-02 ENCOUNTER — Encounter (HOSPITAL_COMMUNITY): Payer: Self-pay | Admitting: Family Medicine

## 2015-04-02 DIAGNOSIS — I10 Essential (primary) hypertension: Secondary | ICD-10-CM | POA: Insufficient documentation

## 2015-04-02 DIAGNOSIS — B349 Viral infection, unspecified: Secondary | ICD-10-CM

## 2015-04-02 DIAGNOSIS — Z3202 Encounter for pregnancy test, result negative: Secondary | ICD-10-CM | POA: Insufficient documentation

## 2015-04-02 DIAGNOSIS — Z79899 Other long term (current) drug therapy: Secondary | ICD-10-CM | POA: Insufficient documentation

## 2015-04-02 DIAGNOSIS — E669 Obesity, unspecified: Secondary | ICD-10-CM | POA: Insufficient documentation

## 2015-04-02 DIAGNOSIS — R1013 Epigastric pain: Secondary | ICD-10-CM | POA: Insufficient documentation

## 2015-04-02 DIAGNOSIS — R1011 Right upper quadrant pain: Secondary | ICD-10-CM

## 2015-04-02 LAB — COMPREHENSIVE METABOLIC PANEL
ALBUMIN: 3.8 g/dL (ref 3.5–5.0)
ALT: 16 U/L (ref 14–54)
AST: 19 U/L (ref 15–41)
Alkaline Phosphatase: 104 U/L (ref 38–126)
Anion gap: 10 (ref 5–15)
BUN: 10 mg/dL (ref 6–20)
CHLORIDE: 106 mmol/L (ref 101–111)
CO2: 22 mmol/L (ref 22–32)
CREATININE: 0.81 mg/dL (ref 0.44–1.00)
Calcium: 9.7 mg/dL (ref 8.9–10.3)
GFR calc Af Amer: >60 mL/min (ref >60)
GFR calc non Af Amer: >60 mL/min (ref >60)
GLUCOSE: 127 mg/dL — AB (ref 65–99)
Potassium: 3.9 mmol/L (ref 3.5–5.1)
SODIUM: 138 mmol/L (ref 135–145)
Total Bilirubin: 0.8 mg/dL (ref 0.3–1.2)
Total Protein: 8.7 g/dL — ABNORMAL HIGH (ref 6.5–8.1)

## 2015-04-02 LAB — CBC
HEMATOCRIT: 41 % (ref 36.0–46.0)
Hemoglobin: 13.1 g/dL (ref 12.0–15.0)
MCH: 29.4 pg (ref 26.0–34.0)
MCHC: 32 g/dL (ref 30.0–36.0)
MCV: 92.1 fL (ref 78.0–100.0)
PLATELETS: 284 10*3/uL (ref 150–400)
RBC: 4.45 MIL/uL (ref 3.87–5.11)
RDW: 13.2 % (ref 11.5–15.5)
WBC: 10.4 10*3/uL (ref 4.0–10.5)

## 2015-04-02 LAB — I-STAT BETA HCG BLOOD, ED (MC, WL, AP ONLY): I-stat hCG, quantitative: <5 m[IU]/mL (ref <5)

## 2015-04-02 LAB — LIPASE, BLOOD: LIPASE: 22 U/L (ref 11–51)

## 2015-04-02 MED ORDER — ONDANSETRON HCL 4 MG PO TABS: 4.0000 mg | ORAL_TABLET | Freq: Four times a day (QID) | ORAL | Status: DC

## 2015-04-02 NOTE — ED Notes (Signed)
Had her gall bladder out 4 years ago and yesterday her abd started to hurt and she had n/v/d, upper abd pain , last meal was cereal  At 3 pm, works at [employer] and kids have the tummy bug

## 2015-04-02 NOTE — Discharge Instructions (Signed)
Please read and follow all provided instructions.  Your diagnoses today include:  1. Right upper quadrant pain   2. Viral syndrome    Tests performed today include:  Blood counts and electrolytes  Blood tests to check liver and kidney function  Blood tests to check pancreas function  Urine test to look for infection and pregnancy (in women)  Vital signs. See below for your results today.   Medications prescribed:   Take any prescribed medications only as directed.  Home care instructions:   Follow any educational materials contained in this packet.  Follow-up instructions: Please follow-up with your primary care provider in the next 2 days for further evaluation of your symptoms.    Return instructions:  SEEK IMMEDIATE MEDICAL ATTENTION IF:  The pain does not go away or becomes severe   A temperature above 101F develops   Repeated vomiting occurs (multiple episodes)   The pain becomes localized to portions of the abdomen. The right side could possibly be appendicitis. In an adult, the left lower portion of the abdomen could be colitis or diverticulitis.   Blood is being passed in stools or vomit (bright red or black tarry stools)   You develop chest pain, difficulty breathing, dizziness or fainting, or become confused, poorly responsive, or inconsolable (young children)  If you have any other emergent concerns regarding your health  Additional Information: Abdominal (belly) pain can be caused by many things. Your caregiver performed an examination and possibly ordered blood/urine tests and imaging (CT scan, x-rays, ultrasound). Many cases can be observed and treated at home after initial evaluation in the emergency department. Even though you are being discharged home, abdominal pain can be unpredictable. Therefore, you need a repeated exam if your pain does not resolve, returns, or worsens. Most patients with abdominal pain don't have to be admitted to the hospital or  have surgery, but serious problems like appendicitis and gallbladder attacks can start out as nonspecific pain. Many abdominal conditions cannot be diagnosed in one visit, so follow-up evaluations are very important.  Your vital signs today were: BP 156/97 mmHg   Pulse 105   Temp(Src) 98.5 F (36.9 C) (Oral)   Resp 12   SpO2 97%   LMP 04/02/2015 If your blood pressure (bp) was elevated above 135/85 this visit, please have this repeated by your doctor within one month. --------------

## 2015-04-02 NOTE — ED Provider Notes (Signed)
CSN: [id number]     Arrival date & time 04/02/15  [id number] History   First MD Initiated Contact with Patient 04/02/15 [phone]     Chief Complaint  Patient presents with  . Emesis  . Diarrhea   (Consider location/radiation/quality/duration/timing/severity/associated sxs/prior Treatment) HPI 25 y.o. female with a hx of cholecystectomy, presents to the Emergency Department today complaining of epigastric/RUQ abdominal pain since last night. Pt notes abdominal spasms that she rates 7/10. Associated N/V/D. No CP/SOB. No dysuria. No fevers. Has not tried any OTC relief. Notes decrease in PO intake. Notes some sinus congestion. Does endorse history for the past 4 years of similar symptoms. No other symptoms noted. 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  Past Medical History  Diagnosis Date  . Hypertension   . Obesity   . Benign essential hypertension antepartum 02/11/2011   Past Surgical History  Procedure Laterality Date  . Laparoscopic cholecystectomy w/ cholangiography  06/10/10    Dr Gaynelle Adu   Family History  Problem Relation Age of Onset  . Hypertension Sister   . Lupus Sister   . Diabetes Sister   . Cancer Maternal Grandmother     GI  . Cancer Maternal Aunt     Breast  . Cancer Maternal Uncle     colon   Social History  Substance Use Topics  . Smoking status: Never Smoker   . Smokeless tobacco: Never Used  . Alcohol Use: No   OB History    Gravida Para Term Preterm AB TAB SAB Ectopic Multiple Living   Review of Systems ROS reviewed and all are negative for acute change except as noted in the HPI.  Allergies  Codeine  Home Medications   Prior to Admission medications   Medication Sig Start Date End Date Taking? Authorizing Provider  norgestimate-ethinyl estradiol (ORTHO-CYCLEN,SPRINTEC,PREVIFEM) 0.25-35 MG-MCG tablet Take 1 tablet by mouth daily. 05/10/13   Bryan R Hess, DO   BP 145/96 mmHg  Pulse 103  Temp(Src) 98.5 F (36.9 C) (Oral)   SpO2 99%  LMP 04/02/2015   Physical Exam  Constitutional: She is oriented to person, place, and time. She appears well-developed and well-nourished. No distress.  HENT:  Head: Normocephalic and atraumatic.  Right Ear: Tympanic membrane, external ear and ear canal normal.  Left Ear: Tympanic membrane, external ear and ear canal normal.  Nose: Nose normal.  Mouth/Throat: Uvula is midline, oropharynx is clear and moist and mucous membranes are normal. No trismus in the jaw. No oropharyngeal exudate, posterior oropharyngeal erythema or tonsillar abscesses.  Eyes: EOM are normal. Pupils are equal, round, and reactive to light.  Neck: Normal range of motion. Neck supple. No tracheal deviation present.  Cardiovascular: Normal rate, regular rhythm, S1 normal, S2 normal, normal heart sounds, intact distal pulses and normal pulses.   Pulmonary/Chest: Effort normal and breath sounds normal. No respiratory distress. She has no decreased breath sounds. She has no wheezes. She has no rhonchi. She has no rales.  Abdominal: Normal appearance and bowel sounds are normal. There is tenderness in the right upper quadrant and epigastric area. There is no rigidity, no rebound, no guarding, no CVA tenderness, no tenderness at McBurney's point and negative Murphy's sign.  Musculoskeletal: Normal range of motion.  Neurological: She is alert and oriented to person, place, and time.  Skin: Skin is warm and dry.  Psychiatric: She has a normal mood and affect. Her  speech is normal and behavior is normal. Thought content normal.   ED Course  Procedures (including critical care time) Labs Review Labs Reviewed  COMPREHENSIVE METABOLIC PANEL - Abnormal; Notable for the following:    Glucose, Bld 127 (*)    Total Protein 8.7 (*)    All other components within normal limits  LIPASE, BLOOD  CBC  URINALYSIS, ROUTINE W REFLEX MICROSCOPIC (NOT AT [facility])  I-STAT BETA HCG BLOOD, ED ([facility], [facility], [facility] ONLY)   Imaging Review No  results found. I have personally reviewed and evaluated these images and lab results as part of my medical decision-making.   EKG Interpretation None      MDM  I have reviewed and evaluated the relevant laboratory values. I have reviewed the relevant previous healthcare records. I obtained HPI from historian. Patient discussed with supervising physician  ED Course:  Assessment: Patient is a 24yF presents with abdominal pain since last night. Of note, pt has had similar symptoms for the past 4 years since surgery. On exam, nontoxic, nonseptic appearing, in no apparent distress. Patient's pain and other symptoms adequately managed in emergency department.  Fluid bolus given.  Labs, and vitals reviewed.  Patient does not meet the SIRS or Sepsis criteria.  On repeat exam patient does not have a surgical abdomen and there are no peritoneal signs.  No indication of appendicitis, bowel obstruction, bowel perforation, cholecystitis, diverticulitis, PID or ectopic pregnancy. Patient discharged home with symptomatic treatment and given strict instructions for follow-up with their primary care physician.  I have also discussed reasons to return immediately to the ER.  I do not feel that this is a surgical complication for 4 years ago. Most likely viral in origin based on symptoms and HPI. Patient expresses understanding and agrees with plan. Given Rx Zofran. At time of discharge, Patient is in no acute distress. Vital Signs are stable. Patient is able to ambulate. Patient able to tolerate PO.   Disposition/Plan:  DC Home Additional Verbal discharge instructions given and discussed with patient.  Pt Instructed to f/u with PCP in the next 48 hours for evaluation and treatment of symptoms. Return precautions given Pt acknowledges and agrees with plan  Supervising Physician Alvira MondayErin Schlossman, MD   Final diagnoses:  Right upper quadrant pain  Viral syndrome      Audry Piliyler Alyvia Derk, PA-C 04/02/15  0959  Audry Piliyler Sherice Ijames, PA-C 04/02/15 1020  Alvira MondayErin Schlossman, MD 04/02/15 2245

## 2015-04-02 NOTE — ED Notes (Signed)
Pt here for abd spasms, N,V,D that started last night. sts she cant keep anything down.

## 2015-05-01 ENCOUNTER — Encounter: Payer: Self-pay | Admitting: Family Medicine

## 2015-05-01 ENCOUNTER — Other Ambulatory Visit (HOSPITAL_COMMUNITY)
Admission: RE | Admit: 2015-05-01 | Discharge: 2015-05-01 | Disposition: A | Discharge status: Home / Self Care | Payer: No Typology Code available for payment source | Source: Ambulatory Visit | Attending: Family Medicine | Admitting: Family Medicine

## 2015-05-01 ENCOUNTER — Ambulatory Visit (INDEPENDENT_AMBULATORY_CARE_PROVIDER_SITE_OTHER): Payer: No Typology Code available for payment source | Admitting: Family Medicine

## 2015-05-01 VITALS — BP 150/78 | HR 89 | Temp 98.6°F | Ht 65.0 in | Wt 266.1 lb

## 2015-05-01 DIAGNOSIS — Z113 Encounter for screening for infections with a predominantly sexual mode of transmission: Secondary | ICD-10-CM | POA: Insufficient documentation

## 2015-05-01 DIAGNOSIS — O10911 Unspecified pre-existing hypertension complicating pregnancy, first trimester: Secondary | ICD-10-CM

## 2015-05-01 DIAGNOSIS — N912 Amenorrhea, unspecified: Secondary | ICD-10-CM

## 2015-05-01 DIAGNOSIS — N760 Acute vaginitis: Secondary | ICD-10-CM | POA: Insufficient documentation

## 2015-05-01 LAB — POCT WET PREP (WET MOUNT): CLUE CELLS WET PREP WHIFF POC: NEGATIVE

## 2015-05-01 LAB — POCT URINE PREGNANCY: Preg Test, Ur: POSITIVE — AB

## 2015-05-01 MED ORDER — PRENATAL VITAMINS 0.8 MG PO TABS: 1.0000 | ORAL_TABLET | Freq: Every day | ORAL | Status: DC

## 2015-05-01 NOTE — Patient Instructions (Addendum)
Congratulations on your pregnancy. You need to take prenatal vitamins each day. Continue to stay away from cigarettes! Also, avoid raw meats and cheeses as well as cat litter.    I'll refer you to high risk clinic at [facility] for your prenatal care. Someone will call with that appointment.  If you have any pelvic pain or vaginal bleeding go immediately to [facility] to be evaluated.   Be well, Dr. Pollie Meyer    First Trimester of Pregnancy The first trimester of pregnancy is from week 1 until the end of week 12 (months 1 through 3). A week after a sperm fertilizes an egg, the egg will implant on the wall of the uterus. This embryo will begin to develop into a baby. Genes from you and your partner are forming the baby. The female genes determine whether the baby is a boy or a girl. At 6-8 weeks, the eyes and face are formed, and the heartbeat can be seen on ultrasound. At the end of 12 weeks, all the baby's organs are formed.  Now that you are pregnant, you will want to do everything you can to have a healthy baby. Two of the most important things are to get good prenatal care and to follow your health care provider's instructions. Prenatal care is all the medical care you receive before the baby's birth. This care will help prevent, find, and treat any problems during the pregnancy and childbirth. BODY CHANGES Your body goes through many changes during pregnancy. The changes vary from woman to woman.   You may gain or lose a couple of pounds at first.  You may feel sick to your stomach (nauseous) and throw up (vomit). If the vomiting is uncontrollable, call your health care provider.  You may tire easily.  You may develop headaches that can be relieved by medicines approved by your health care provider.  You may urinate more often. Painful urination may mean you have a bladder infection.  You may develop heartburn as a result of your pregnancy.  You may develop constipation  because certain hormones are causing the muscles that push waste through your intestines to slow down.  You may develop hemorrhoids or swollen, bulging veins (varicose veins).  Your breasts may begin to grow larger and become tender. Your nipples may stick out more, and the tissue that surrounds them (areola) may become darker.  Your gums may bleed and may be sensitive to brushing and flossing.  Dark spots or blotches (chloasma, mask of pregnancy) may develop on your face. This will likely fade after the baby is born.  Your menstrual periods will stop.  You may have a loss of appetite.  You may develop cravings for certain kinds of food.  You may have changes in your emotions from day to day, such as being excited to be pregnant or being concerned that something may go wrong with the pregnancy and baby.  You may have more vivid and strange dreams.  You may have changes in your hair. These can include thickening of your hair, rapid growth, and changes in texture. Some women also have hair loss during or after pregnancy, or hair that feels dry or thin. Your hair will most likely return to normal after your baby is born. WHAT TO EXPECT AT YOUR PRENATAL VISITS During a routine prenatal visit:  You will be weighed to make sure you and the baby are growing normally.  Your blood pressure will be taken.  Your abdomen will be  measured to track your baby's growth.  The fetal heartbeat will be listened to starting around week 10 or 12 of your pregnancy.  Test results from any previous visits will be discussed. Your health care provider may ask you:  How you are feeling.  If you are feeling the baby move.  If you have had any abnormal symptoms, such as leaking fluid, bleeding, severe headaches, or abdominal cramping.  If you are using any tobacco products, including cigarettes, chewing tobacco, and electronic cigarettes.  If you have any questions. Other tests that may be performed  during your first trimester include:  Blood tests to find your blood type and to check for the presence of any previous infections. They will also be used to check for low iron levels (anemia) and Rh antibodies. Later in the pregnancy, blood tests for diabetes will be done along with other tests if problems develop.  Urine tests to check for infections, diabetes, or protein in the urine.  An ultrasound to confirm the proper growth and development of the baby.  An amniocentesis to check for possible genetic problems.  Fetal screens for spina bifida and Down syndrome.  You may need other tests to make sure you and the baby are doing well.  HIV (human immunodeficiency virus) testing. Routine prenatal testing includes screening for HIV, unless you choose not to have this test. HOME CARE INSTRUCTIONS  Medicines  Follow your health care provider's instructions regarding medicine use. Specific medicines may be either safe or unsafe to take during pregnancy.  Take your prenatal vitamins as directed.  If you develop constipation, try taking a stool softener if your health care provider approves. Diet  Eat regular, well-balanced meals. Choose a variety of foods, such as meat or vegetable-based protein, fish, milk and low-fat dairy products, vegetables, fruits, and whole grain breads and cereals. Your health care provider will help you determine the amount of weight gain that is right for you.  Avoid raw meat and uncooked cheese. These carry germs that can cause birth defects in the baby.  Eating four or five small meals rather than three large meals a day may help relieve nausea and vomiting. If you start to feel nauseous, eating a few soda crackers can be helpful. Drinking liquids between meals instead of during meals also seems to help nausea and vomiting.  If you develop constipation, eat more high-fiber foods, such as fresh vegetables or fruit and whole grains. Drink enough fluids to keep  your urine clear or pale yellow. Activity and Exercise  Exercise only as directed by your health care provider. Exercising will help you:  Control your weight.  Stay in shape.  Be prepared for labor and delivery.  Experiencing pain or cramping in the lower abdomen or low back is a good sign that you should stop exercising. Check with your health care provider before continuing normal exercises.  Try to avoid standing for long periods of time. Move your legs often if you must stand in one place for a long time.  Avoid heavy lifting.  Wear low-heeled shoes, and practice good posture.  You may continue to have sex unless your health care provider directs you otherwise. Relief of Pain or Discomfort  Wear a good support bra for breast tenderness.   Take warm sitz baths to soothe any pain or discomfort caused by hemorrhoids. Use hemorrhoid cream if your health care provider approves.   Rest with your legs elevated if you have leg cramps or low back  pain.  If you develop varicose veins in your legs, wear support hose. Elevate your feet for 15 minutes, 3-4 times a day. Limit salt in your diet. Prenatal Care  Schedule your prenatal visits by the twelfth week of pregnancy. They are usually scheduled monthly at first, then more often in the last 2 months before delivery.  Write down your questions. Take them to your prenatal visits.  Keep all your prenatal visits as directed by your health care provider. Safety  Wear your seat belt at all times when driving.  Make a list of emergency phone numbers, including numbers for family, friends, the hospital, and police and fire departments. General Tips  Ask your health care provider for a referral to a local prenatal education class. Begin classes no later than at the beginning of month 6 of your pregnancy.  Ask for help if you have counseling or nutritional needs during pregnancy. Your health care provider can offer advice or refer you  to specialists for help with various needs.  Do not use hot tubs, steam rooms, or saunas.  Do not douche or use tampons or scented sanitary pads.  Do not cross your legs for long periods of time.  Avoid cat litter boxes and soil used by cats. These carry germs that can cause birth defects in the baby and possibly loss of the fetus by miscarriage or stillbirth.  Avoid all smoking, herbs, alcohol, and medicines not prescribed by your health care provider. Chemicals in these affect the formation and growth of the baby.  Do not use any tobacco products, including cigarettes, chewing tobacco, and electronic cigarettes. If you need help quitting, ask your health care provider. You may receive counseling support and other resources to help you quit.  Schedule a dentist appointment. At home, brush your teeth with a soft toothbrush and be gentle when you floss. SEEK MEDICAL CARE IF:   You have dizziness.  You have mild pelvic cramps, pelvic pressure, or nagging pain in the abdominal area.  You have persistent nausea, vomiting, or diarrhea.  You have a bad smelling vaginal discharge.  You have pain with urination.  You notice increased swelling in your face, hands, legs, or ankles. SEEK IMMEDIATE MEDICAL CARE IF:   You have a fever.  You are leaking fluid from your vagina.  You have spotting or bleeding from your vagina.  You have severe abdominal cramping or pain.  You have rapid weight gain or loss.  You vomit blood or material that looks like coffee grounds.  You are exposed to [place] measles and have never had them.  You are exposed to fifth disease or chickenpox.  You develop a severe headache.  You have shortness of breath.  You have any kind of trauma, such as from a fall or a car accident.   This information is not intended to replace advice given to you by your health care provider. Make sure you discuss any questions you have with your health care provider.     Document Released: 12/16/2000 Document Revised: 01/12/2014 Document Reviewed: 11/01/2012 Elsevier Interactive Patient Education [employer]

## 2015-05-01 NOTE — Progress Notes (Signed)
Date of Visit: 05/01/2015   HPI:  Patient presents for a same day appointment to discuss positive pregnancy test.  LMP was 03/29/15. She is sure of this. Period was regular prior to pregnancy. Was using condoms but evidently one of them messed up. She took the morning after pill but still got pregnant. Prior to this pregnancy was a [id number] (two prior vaginal deliveries, each complicated by gestational hypertension).   Also notes she's had some vaginal irritation for the last week. Unsure if it's due to her body wash. No odor. Some white discharge. Has been itching. Denies vaginal bleeding or pelvic pain.  ROS: See HPI  PMFSH: history of hypertension  PHYSICAL EXAM: BP 151/91 mmHg  Pulse 89  Temp(Src) 98.6 F (37 C) (Oral)  Ht 5\' 5"  (1.651 m)  Wt 266 lb 1.6 oz (120.702 kg)  BMI 44.28 kg/m2  LMP 03/29/2015  BP on recheck: 150/89 Gen: NAD, pleasant, cooperative HEENT: normocephalic, atraumatic  GU: normal appearing external genitalia without lesions. Vagina is moist with white discharge. Cervix normal in appearance. No cervical motion tenderness or tenderness on bimanual exam. No adnexal masses.   ASSESSMENT/PLAN:  1. Positive pregnancy test: confirmed here in clinic with positive UPT. Sure LMP dates pregnancy at 7160w5d (though had taken plan B, so dating not entirely reliable).  -sent in rx for PNV -patient with history of chronic hypertension and will thus need prenatal care at high risk OB clinic. BP elevated x2 today(151/91, 150/78). Will refer to high risk clinic. BP only with mild elevation today so will hold on starting medication to lower blood pressure. -reviewed early pregnancy precautions (bleeding, pelvic pain) and when to seek care in MAU  2. Vaginal discharge: check gc/chlamydia/trich and wet prep today  FOLLOW UP: Referring to [facility] clinic to establish prenatal care.  GrenadaBrittany J. Pollie MeyerMcIntyre, MD [facility]

## 2015-05-02 ENCOUNTER — Inpatient Hospital Stay (HOSPITAL_COMMUNITY)
Admission: AD | Admit: 2015-05-02 | Discharge: 2015-05-02 | Disposition: A | Discharge status: Home / Self Care | Payer: No Typology Code available for payment source | Source: Ambulatory Visit | Attending: Obstetrics & Gynecology | Admitting: Obstetrics & Gynecology

## 2015-05-02 ENCOUNTER — Encounter (HOSPITAL_COMMUNITY): Payer: Self-pay | Admitting: *Deleted

## 2015-05-02 DIAGNOSIS — E669 Obesity, unspecified: Secondary | ICD-10-CM | POA: Insufficient documentation

## 2015-05-02 DIAGNOSIS — O26891 Other specified pregnancy related conditions, first trimester: Secondary | ICD-10-CM | POA: Diagnosis not present

## 2015-05-02 DIAGNOSIS — O26899 Other specified pregnancy related conditions, unspecified trimester: Secondary | ICD-10-CM

## 2015-05-02 DIAGNOSIS — O9989 Other specified diseases and conditions complicating pregnancy, childbirth and the puerperium: Secondary | ICD-10-CM

## 2015-05-02 DIAGNOSIS — R1030 Lower abdominal pain, unspecified: Secondary | ICD-10-CM | POA: Insufficient documentation

## 2015-05-02 DIAGNOSIS — Z3A01 Less than 8 weeks gestation of pregnancy: Secondary | ICD-10-CM | POA: Diagnosis not present

## 2015-05-02 DIAGNOSIS — Z885 Allergy status to narcotic agent status: Secondary | ICD-10-CM | POA: Insufficient documentation

## 2015-05-02 DIAGNOSIS — I10 Essential (primary) hypertension: Secondary | ICD-10-CM | POA: Insufficient documentation

## 2015-05-02 DIAGNOSIS — R109 Unspecified abdominal pain: Secondary | ICD-10-CM

## 2015-05-02 LAB — CERVICOVAGINAL ANCILLARY ONLY
Chlamydia: NEGATIVE
Neisseria Gonorrhea: NEGATIVE
TRICH (WINDOWPATH): POSITIVE — AB

## 2015-05-02 LAB — HCG, QUANTITATIVE, PREGNANCY: HCG, BETA CHAIN, QUANT, S: 3280 m[IU]/mL — AB (ref <5)

## 2015-05-02 LAB — CBC
HEMATOCRIT: 34.5 % — AB (ref 36.0–46.0)
HEMOGLOBIN: 11.4 g/dL — AB (ref 12.0–15.0)
MCH: 29.5 pg (ref 26.0–34.0)
MCHC: 33 g/dL (ref 30.0–36.0)
MCV: 89.4 fL (ref 78.0–100.0)
Platelets: 271 10*3/uL (ref 150–400)
RBC: 3.86 MIL/uL — ABNORMAL LOW (ref 3.87–5.11)
RDW: 14.2 % (ref 11.5–15.5)
WBC: 6.6 10*3/uL (ref 4.0–10.5)
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
## 2015-05-02 NOTE — Discharge Instructions (Signed)
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
## 2015-05-03 ENCOUNTER — Telehealth: Payer: Self-pay | Admitting: Family Medicine

## 2015-05-03 ENCOUNTER — Encounter: Payer: Self-pay | Admitting: Family Medicine

## 2015-05-03 ENCOUNTER — Other Ambulatory Visit: Payer: Self-pay | Admitting: Family Medicine

## 2015-05-03 ENCOUNTER — Ambulatory Visit (INDEPENDENT_AMBULATORY_CARE_PROVIDER_SITE_OTHER): Payer: Self-pay | Admitting: Family Medicine

## 2015-05-03 ENCOUNTER — Ambulatory Visit (HOSPITAL_COMMUNITY)
Admit: 2015-05-03 | Discharge: 2015-05-03 | Disposition: A | Discharge status: Home / Self Care | Payer: No Typology Code available for payment source | Attending: Advanced Practice Midwife | Admitting: Advanced Practice Midwife

## 2015-05-03 DIAGNOSIS — R1030 Lower abdominal pain, unspecified: Secondary | ICD-10-CM | POA: Insufficient documentation

## 2015-05-03 DIAGNOSIS — O23599 Infection of other part of genital tract in pregnancy, unspecified trimester: Secondary | ICD-10-CM

## 2015-05-03 DIAGNOSIS — O26899 Other specified pregnancy related conditions, unspecified trimester: Secondary | ICD-10-CM

## 2015-05-03 DIAGNOSIS — R109 Unspecified abdominal pain: Secondary | ICD-10-CM

## 2015-05-03 DIAGNOSIS — O359XX0 Maternal care for (suspected) fetal abnormality and damage, unspecified, not applicable or unspecified: Secondary | ICD-10-CM

## 2015-05-03 DIAGNOSIS — O9989 Other specified diseases and conditions complicating pregnancy, childbirth and the puerperium: Secondary | ICD-10-CM | POA: Insufficient documentation

## 2015-05-03 DIAGNOSIS — N83202 Unspecified ovarian cyst, left side: Secondary | ICD-10-CM | POA: Insufficient documentation

## 2015-05-03 DIAGNOSIS — A5901 Trichomonal vulvovaginitis: Secondary | ICD-10-CM | POA: Insufficient documentation

## 2015-05-03 DIAGNOSIS — O3481 Maternal care for other abnormalities of pelvic organs, first trimester: Secondary | ICD-10-CM | POA: Insufficient documentation

## 2015-05-03 DIAGNOSIS — O3680X0 Pregnancy with inconclusive fetal viability, not applicable or unspecified: Secondary | ICD-10-CM

## 2015-05-03 DIAGNOSIS — Z3A01 Less than 8 weeks gestation of pregnancy: Secondary | ICD-10-CM | POA: Insufficient documentation

## 2015-05-03 LAB — HCG, QUANTITATIVE, PREGNANCY: hCG, Beta Chain, Quant, S: 4484.3 m[IU]/mL — ABNORMAL HIGH

## 2015-05-03 MED ORDER — METRONIDAZOLE 500 MG PO TABS: 2000.0000 mg | ORAL_TABLET | Freq: Once | ORAL | Status: DC
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
## 2015-05-07 LAB — BETA HCG QUANT (REF LAB): Beta hCG, Tumor Marker: 5287 m[IU]/mL — ABNORMAL HIGH (ref <5.0)

## 2015-05-13 ENCOUNTER — Telehealth: Payer: Self-pay | Admitting: Family Medicine

## 2015-05-13 NOTE — Telephone Encounter (Signed)
Would like to speak with nurse regarding recent rx given by Dr. Pollie MeyerMcIntyre.  Medication for metrondazole was taken for full dosage but patient's symptoms have not diminished.

## 2015-05-15 ENCOUNTER — Ambulatory Visit (HOSPITAL_COMMUNITY)
Admission: RE | Admit: 2015-05-15 | Discharge: 2015-05-15 | Disposition: A | Discharge status: Home / Self Care | Payer: Medicaid Other | Source: Ambulatory Visit | Attending: Family Medicine | Admitting: Family Medicine

## 2015-05-15 DIAGNOSIS — Z3A01 Less than 8 weeks gestation of pregnancy: Secondary | ICD-10-CM | POA: Insufficient documentation

## 2015-05-15 DIAGNOSIS — O3680X0 Pregnancy with inconclusive fetal viability, not applicable or unspecified: Secondary | ICD-10-CM

## 2015-05-15 DIAGNOSIS — Z36 Encounter for antenatal screening of mother: Secondary | ICD-10-CM | POA: Insufficient documentation

## 2015-05-15 MED ORDER — METRONIDAZOLE 500 MG PO TABS: 500.0000 mg | ORAL_TABLET | Freq: Two times a day (BID) | ORAL | Status: DC

## 2015-05-15 NOTE — Telephone Encounter (Signed)
Returned call to patient She states after taking the 4 flagyl pills at once things got better But recently returned, having same vaginal symptoms as before  She would like to try the flagyl 500mg  twice daily for 7 days Will send in rx Advised if this does not help she will need to be seen again for testing  Of note she has upcoming visit scheduled to establish with [facility] due to chronic hypertension   Latrelle DodrillBrittany J McIntyre, MD

## 2015-05-15 NOTE — Telephone Encounter (Signed)
Patient calling again requesting to speak with Dr. Pollie MeyerMcIntyre.

## 2015-05-15 NOTE — Telephone Encounter (Signed)
Please call patient and let her know I have been out of town and am in [place] this morning but I will get back to her later this morning or afternoon.  Thanks, Latrelle DodrillBrittany J Ezell Poke, MD

## 2015-05-15 NOTE — Telephone Encounter (Signed)
Patient informed MD was seeing patients this morning, states she will await callback.

## 2015-06-13 ENCOUNTER — Ambulatory Visit (INDEPENDENT_AMBULATORY_CARE_PROVIDER_SITE_OTHER): Payer: Self-pay | Admitting: Obstetrics and Gynecology

## 2015-06-13 ENCOUNTER — Other Ambulatory Visit (HOSPITAL_COMMUNITY)
Admission: RE | Admit: 2015-06-13 | Discharge: 2015-06-13 | Disposition: A | Discharge status: Home / Self Care | Payer: Medicaid Other | Source: Ambulatory Visit | Attending: Obstetrics and Gynecology | Admitting: Obstetrics and Gynecology

## 2015-06-13 ENCOUNTER — Encounter: Payer: Self-pay | Admitting: Obstetrics and Gynecology

## 2015-06-13 VITALS — BP 142/89 | HR 90 | Wt 260.7 lb

## 2015-06-13 DIAGNOSIS — O10911 Unspecified pre-existing hypertension complicating pregnancy, first trimester: Secondary | ICD-10-CM

## 2015-06-13 DIAGNOSIS — Z36 Encounter for antenatal screening of mother: Secondary | ICD-10-CM

## 2015-06-13 DIAGNOSIS — O3680X0 Pregnancy with inconclusive fetal viability, not applicable or unspecified: Secondary | ICD-10-CM

## 2015-06-13 DIAGNOSIS — O119 Pre-existing hypertension with pre-eclampsia, unspecified trimester: Secondary | ICD-10-CM | POA: Insufficient documentation

## 2015-06-13 DIAGNOSIS — O0990 Supervision of high risk pregnancy, unspecified, unspecified trimester: Secondary | ICD-10-CM | POA: Insufficient documentation

## 2015-06-13 DIAGNOSIS — Z113 Encounter for screening for infections with a predominantly sexual mode of transmission: Secondary | ICD-10-CM | POA: Insufficient documentation

## 2015-06-13 DIAGNOSIS — O0991 Supervision of high risk pregnancy, unspecified, first trimester: Secondary | ICD-10-CM

## 2015-06-13 DIAGNOSIS — O3680X1 Pregnancy with inconclusive fetal viability, fetus 1: Secondary | ICD-10-CM

## 2015-06-13 LAB — POCT URINALYSIS DIP (DEVICE)
BILIRUBIN URINE: NEGATIVE
Glucose, UA: NEGATIVE mg/dL
HGB URINE DIPSTICK: NEGATIVE
Ketones, ur: NEGATIVE mg/dL
Nitrite: NEGATIVE
Protein, ur: NEGATIVE mg/dL
SPECIFIC GRAVITY, URINE: 1.02 (ref 1.005–1.030)
Urobilinogen, UA: 2 mg/dL — ABNORMAL HIGH (ref 0.0–1.0)
pH: 7 (ref 5.0–8.0)

## 2015-06-13 MED ORDER — ASPIRIN EC 81 MG PO TBEC: 81.0000 mg | DELAYED_RELEASE_TABLET | Freq: Every day | ORAL | Status: DC

## 2015-06-13 MED ORDER — LABETALOL HCL 200 MG PO TABS: 200.0000 mg | ORAL_TABLET | Freq: Two times a day (BID) | ORAL | Status: DC

## 2015-06-13 NOTE — Patient Instructions (Signed)
First Trimester of Pregnancy The first trimester of pregnancy is from week 1 until the end of week 12 (months 1 through 3). A week after a sperm fertilizes an egg, the egg will implant on the wall of the uterus. This embryo will begin to develop into a baby. Genes from you and your partner are forming the baby. The female genes determine whether the baby is a boy or a girl. At 6-8 weeks, the eyes and face are formed, and the heartbeat can be seen on ultrasound. At the end of 12 weeks, all the baby's organs are formed.  Now that you are pregnant, you will want to do everything you can to have a healthy baby. Two of the most important things are to get good prenatal care and to follow your health care provider's instructions. Prenatal care is all the medical care you receive before the baby's birth. This care will help prevent, find, and treat any problems during the pregnancy and childbirth. BODY CHANGES Your body goes through many changes during pregnancy. The changes vary from woman to woman.   You may gain or lose a couple of pounds at first.  You may feel sick to your stomach (nauseous) and throw up (vomit). If the vomiting is uncontrollable, call your health care provider.  You may tire easily.  You may develop headaches that can be relieved by medicines approved by your health care provider.  You may urinate more often. Painful urination may mean you have a bladder infection.  You may develop heartburn as a result of your pregnancy.  You may develop constipation because certain hormones are causing the muscles that push waste through your intestines to slow down.  You may develop hemorrhoids or swollen, bulging veins (varicose veins).  Your breasts may begin to grow larger and become tender. Your nipples may stick out more, and the tissue that surrounds them (areola) may become darker.  Your gums may bleed and may be sensitive to brushing and flossing.  Dark spots or blotches  (chloasma, mask of pregnancy) may develop on your face. This will likely fade after the baby is born.  Your menstrual periods will stop.  You may have a loss of appetite.  You may develop cravings for certain kinds of food.  You may have changes in your emotions from day to day, such as being excited to be pregnant or being concerned that something may go wrong with the pregnancy and baby.  You may have more vivid and strange dreams.  You may have changes in your hair. These can include thickening of your hair, rapid growth, and changes in texture. Some women also have hair loss during or after pregnancy, or hair that feels dry or thin. Your hair will most likely return to normal after your baby is born. WHAT TO EXPECT AT YOUR PRENATAL VISITS During a routine prenatal visit:  You will be weighed to make sure you and the baby are growing normally.  Your blood pressure will be taken.  Your abdomen will be measured to track your baby's growth.  The fetal heartbeat will be listened to starting around week 10 or 12 of your pregnancy.  Test results from any previous visits will be discussed. Your health care provider may ask you:  How you are feeling.  If you are feeling the baby move.  If you have had any abnormal symptoms, such as leaking fluid, bleeding, severe headaches, or abdominal cramping.  If you are using any tobacco products,   including cigarettes, chewing tobacco, and electronic cigarettes.  If you have any questions. Other tests that may be performed during your first trimester include:  Blood tests to find your blood type and to check for the presence of any previous infections. They will also be used to check for low iron levels (anemia) and Rh antibodies. Later in the pregnancy, blood tests for diabetes will be done along with other tests if problems develop.  Urine tests to check for infections, diabetes, or protein in the urine.  An ultrasound to confirm the  proper growth and development of the baby.  An amniocentesis to check for possible genetic problems.  Fetal screens for spina bifida and Down syndrome.  You may need other tests to make sure you and the baby are doing well.  HIV (human immunodeficiency virus) testing. Routine prenatal testing includes screening for HIV, unless you choose not to have this test. HOME CARE INSTRUCTIONS  Medicines  Follow your health care provider's instructions regarding medicine use. Specific medicines may be either safe or unsafe to take during pregnancy.  Take your prenatal vitamins as directed.  If you develop constipation, try taking a stool softener if your health care provider approves. Diet  Eat regular, well-balanced meals. Choose a variety of foods, such as meat or vegetable-based protein, fish, milk and low-fat dairy products, vegetables, fruits, and whole grain breads and cereals. Your health care provider will help you determine the amount of weight gain that is right for you.  Avoid raw meat and uncooked cheese. These carry germs that can cause birth defects in the baby.  Eating four or five small meals rather than three large meals a day may help relieve nausea and vomiting. If you start to feel nauseous, eating a few soda crackers can be helpful. Drinking liquids between meals instead of during meals also seems to help nausea and vomiting.  If you develop constipation, eat more high-fiber foods, such as fresh vegetables or fruit and whole grains. Drink enough fluids to keep your urine clear or pale yellow. Activity and Exercise  Exercise only as directed by your health care provider. Exercising will help you:  Control your weight.  Stay in shape.  Be prepared for labor and delivery.  Experiencing pain or cramping in the lower abdomen or low back is a good sign that you should stop exercising. Check with your health care provider before continuing normal exercises.  Try to avoid  standing for long periods of time. Move your legs often if you must stand in one place for a long time.  Avoid heavy lifting.  Wear low-heeled shoes, and practice good posture.  You may continue to have sex unless your health care provider directs you otherwise. Relief of Pain or Discomfort  Wear a good support bra for breast tenderness.   Take warm sitz baths to soothe any pain or discomfort caused by hemorrhoids. Use hemorrhoid cream if your health care provider approves.   Rest with your legs elevated if you have leg cramps or low back pain.  If you develop varicose veins in your legs, wear support hose. Elevate your feet for 15 minutes, 3-4 times a day. Limit salt in your diet. Prenatal Care  Schedule your prenatal visits by the twelfth week of pregnancy. They are usually scheduled monthly at first, then more often in the last 2 months before delivery.  Write down your questions. Take them to your prenatal visits.  Keep all your prenatal visits as directed by your   health care provider. Safety  Wear your seat belt at all times when driving.  Make a list of emergency phone numbers, including numbers for family, friends, the hospital, and police and fire departments. General Tips  Ask your health care provider for a referral to a local prenatal education class. Begin classes no later than at the beginning of month 6 of your pregnancy.  Ask for help if you have counseling or nutritional needs during pregnancy. Your health care provider can offer advice or refer you to specialists for help with various needs.  Do not use hot tubs, steam rooms, or saunas.  Do not douche or use tampons or scented sanitary pads.  Do not cross your legs for long periods of time.  Avoid cat litter boxes and soil used by cats. These carry germs that can cause birth defects in the baby and possibly loss of the fetus by miscarriage or stillbirth.  Avoid all smoking, herbs, alcohol, and medicines  not prescribed by your health care provider. Chemicals in these affect the formation and growth of the baby.  Do not use any tobacco products, including cigarettes, chewing tobacco, and electronic cigarettes. If you need help quitting, ask your health care provider. You may receive counseling support and other resources to help you quit.  Schedule a dentist appointment. At home, brush your teeth with a soft toothbrush and be gentle when you floss. SEEK MEDICAL CARE IF:   You have dizziness.  You have mild pelvic cramps, pelvic pressure, or nagging pain in the abdominal area.  You have persistent nausea, vomiting, or diarrhea.  You have a bad smelling vaginal discharge.  You have pain with urination.  You notice increased swelling in your face, hands, legs, or ankles. SEEK IMMEDIATE MEDICAL CARE IF:   You have a fever.  You are leaking fluid from your vagina.  You have spotting or bleeding from your vagina.  You have severe abdominal cramping or pain.  You have rapid weight gain or loss.  You vomit blood or material that looks like coffee grounds.  You are exposed to German measles and have never had them.  You are exposed to fifth disease or chickenpox.  You develop a severe headache.  You have shortness of breath.  You have any kind of trauma, such as from a fall or a car accident.   This information is not intended to replace advice given to you by your health care provider. Make sure you discuss any questions you have with your health care provider.   Document Released: 12/16/2000 Document Revised: 01/12/2014 Document Reviewed: 11/01/2012 Elsevier Interactive Patient Education 2016 [employer]  Contraception Choices Contraception (birth control) is the use of any methods or devices to prevent pregnancy. Below are some methods to help avoid pregnancy. HORMONAL METHODS   Contraceptive implant. This is a thin, plastic tube containing progesterone hormone. It does  not contain estrogen hormone. Your health care provider inserts the tube in the inner part of the upper arm. The tube can remain in place for up to 3 years. After 3 years, the implant must be removed. The implant prevents the ovaries from releasing an egg (ovulation), thickens the cervical mucus to prevent sperm from entering the uterus, and thins the lining of the inside of the uterus.  Progesterone-only injections. These injections are given every 3 months by your health care provider to prevent pregnancy. This synthetic progesterone hormone stops the ovaries from releasing eggs. It also thickens cervical mucus and changes the   uterine lining. This makes it harder for sperm to survive in the uterus.  Birth control pills. These pills contain estrogen and progesterone hormone. They work by preventing the ovaries from releasing eggs (ovulation). They also cause the cervical mucus to thicken, preventing the sperm from entering the uterus. Birth control pills are prescribed by a health care provider.Birth control pills can also be used to treat heavy periods.  Minipill. This type of birth control pill contains only the progesterone hormone. They are taken every day of each month and must be prescribed by your health care provider.  Birth control patch. The patch contains hormones similar to those in birth control pills. It must be changed once a week and is prescribed by a health care provider.  Vaginal ring. The ring contains hormones similar to those in birth control pills. It is left in the vagina for 3 weeks, removed for 1 week, and then a new one is put back in place. The patient must be comfortable inserting and removing the ring from the vagina.A health care provider's prescription is necessary.  Emergency contraception. Emergency contraceptives prevent pregnancy after unprotected sexual intercourse. This pill can be taken right after sex or up to 5 days after unprotected sex. It is most effective  the sooner you take the pills after having sexual intercourse. Most emergency contraceptive pills are available without a prescription. Check with your pharmacist. Do not use emergency contraception as your only form of birth control. BARRIER METHODS   Female condom. This is a thin sheath (latex or rubber) that is worn over the penis during sexual intercourse. It can be used with spermicide to increase effectiveness.  Female condom. This is a soft, loose-fitting sheath that is put into the vagina before sexual intercourse.  Diaphragm. This is a soft, latex, dome-shaped barrier that must be fitted by a health care provider. It is inserted into the vagina, along with a spermicidal jelly. It is inserted before intercourse. The diaphragm should be left in the vagina for 6 to 8 hours after intercourse.  Cervical cap. This is a round, soft, latex or plastic cup that fits over the cervix and must be fitted by a health care provider. The cap can be left in place for up to 48 hours after intercourse.  Sponge. This is a soft, circular piece of polyurethane foam. The sponge has spermicide in it. It is inserted into the vagina after wetting it and before sexual intercourse.  Spermicides. These are chemicals that kill or block sperm from entering the cervix and uterus. They come in the form of creams, jellies, suppositories, foam, or tablets. They do not require a prescription. They are inserted into the vagina with an applicator before having sexual intercourse. The process must be repeated every time you have sexual intercourse. INTRAUTERINE CONTRACEPTION  Intrauterine device (IUD). This is a T-shaped device that is put in a woman's uterus during a menstrual period to prevent pregnancy. There are 2 types:  Copper IUD. This type of IUD is wrapped in copper wire and is placed inside the uterus. Copper makes the uterus and fallopian tubes produce a fluid that kills sperm. It can stay in place for 10  years.  Hormone IUD. This type of IUD contains the hormone progestin (synthetic progesterone). The hormone thickens the cervical mucus and prevents sperm from entering the uterus, and it also thins the uterine lining to prevent implantation of a fertilized egg. The hormone can weaken or kill the sperm that get into the   uterus. It can stay in place for 3-5 years, depending on which type of IUD is used. PERMANENT METHODS OF CONTRACEPTION  Female tubal ligation. This is when the woman's fallopian tubes are surgically sealed, tied, or blocked to prevent the egg from traveling to the uterus.  Hysteroscopic sterilization. This involves placing a small coil or insert into each fallopian tube. Your doctor uses a technique called hysteroscopy to do the procedure. The device causes scar tissue to form. This results in permanent blockage of the fallopian tubes, so the sperm cannot fertilize the egg. It takes about 3 months after the procedure for the tubes to become blocked. You must use another form of birth control for these 3 months.  Female sterilization. This is when the female has the tubes that carry sperm tied off (vasectomy).This blocks sperm from entering the vagina during sexual intercourse. After the procedure, the man can still ejaculate fluid (semen). NATURAL PLANNING METHODS  Natural family planning. This is not having sexual intercourse or using a barrier method (condom, diaphragm, cervical cap) on days the woman could become pregnant.  Calendar method. This is keeping track of the length of each menstrual cycle and identifying when you are fertile.  Ovulation method. This is avoiding sexual intercourse during ovulation.  Symptothermal method. This is avoiding sexual intercourse during ovulation, using a thermometer and ovulation symptoms.  Post-ovulation method. This is timing sexual intercourse after you have ovulated. Regardless of which type or method of contraception you choose, it is  important that you use condoms to protect against the transmission of sexually transmitted infections (STIs). Talk with your health care provider about which form of contraception is most appropriate for you.   This information is not intended to replace advice given to you by your health care provider. Make sure you discuss any questions you have with your health care provider.   Document Released: 12/22/2004 Document Revised: 12/27/2012 Document Reviewed: 06/16/2012 Elsevier Interactive Patient Education 2016 [employer]  Breastfeeding Deciding to breastfeed is one of the best choices you can make for you and your baby. A change in hormones during pregnancy causes your breast tissue to grow and increases the number and size of your milk ducts. These hormones also allow proteins, sugars, and fats from your blood supply to make breast milk in your milk-producing glands. Hormones prevent breast milk from being released before your baby is born as well as prompt milk flow after birth. Once breastfeeding has begun, thoughts of your baby, as well as his or her sucking or crying, can stimulate the release of milk from your milk-producing glands.  BENEFITS OF BREASTFEEDING For Your Baby  Your first milk (colostrum) helps your baby's digestive system function better.  There are antibodies in your milk that help your baby fight off infections.  Your baby has a lower incidence of asthma, allergies, and sudden infant death syndrome.  The nutrients in breast milk are better for your baby than infant formulas and are designed uniquely for your baby's needs.  Breast milk improves your baby's brain development.  Your baby is less likely to develop other conditions, such as childhood obesity, asthma, or type 2 diabetes mellitus. For You  Breastfeeding helps to create a very special bond between you and your baby.  Breastfeeding is convenient. Breast milk is always available at the correct temperature  and costs nothing.  Breastfeeding helps to burn calories and helps you lose the weight gained during pregnancy.  Breastfeeding makes your uterus contract to its   prepregnancy size faster and slows bleeding (lochia) after you give birth.   Breastfeeding helps to lower your risk of developing type 2 diabetes mellitus, osteoporosis, and breast or ovarian cancer later in life. SIGNS THAT YOUR BABY IS HUNGRY Early Signs of Hunger  Increased alertness or activity.  Stretching.  Movement of the head from side to side.  Movement of the head and opening of the mouth when the corner of the mouth or cheek is stroked (rooting).  Increased sucking sounds, smacking lips, cooing, sighing, or squeaking.  Hand-to-mouth movements.  Increased sucking of fingers or hands. Late Signs of Hunger  Fussing.  Intermittent crying. Extreme Signs of Hunger Signs of extreme hunger will require calming and consoling before your baby will be able to breastfeed successfully. Do not wait for the following signs of extreme hunger to occur before you initiate breastfeeding:  Restlessness.  A loud, strong cry.  Screaming. BREASTFEEDING BASICS Breastfeeding Initiation  Find a comfortable place to sit or lie down, with your neck and back well supported.  Place a pillow or rolled up blanket under your baby to bring him or her to the level of your breast (if you are seated). Nursing pillows are specially designed to help support your arms and your baby while you breastfeed.  Make sure that your baby's abdomen is facing your abdomen.  Gently massage your breast. With your fingertips, massage from your chest wall toward your nipple in a circular motion. This encourages milk flow. You may need to continue this action during the feeding if your milk flows slowly.  Support your breast with 4 fingers underneath and your thumb above your nipple. Make sure your fingers are well away from your nipple and your baby's  mouth.  Stroke your baby's lips gently with your finger or nipple.  When your baby's mouth is open wide enough, quickly bring your baby to your breast, placing your entire nipple and as much of the colored area around your nipple (areola) as possible into your baby's mouth.  More areola should be visible above your baby's upper lip than below the lower lip.  Your baby's tongue should be between his or her lower gum and your breast.  Ensure that your baby's mouth is correctly positioned around your nipple (latched). Your baby's lips should create a seal on your breast and be turned out (everted).  It is common for your baby to suck about 2-3 minutes in order to start the flow of breast milk. Latching Teaching your baby how to latch on to your breast properly is very important. An improper latch can cause nipple pain and decreased milk supply for you and poor weight gain in your baby. Also, if your baby is not latched onto your nipple properly, he or she may swallow some air during feeding. This can make your baby fussy. Burping your baby when you switch breasts during the feeding can help to get rid of the air. However, teaching your baby to latch on properly is still the best way to prevent fussiness from swallowing air while breastfeeding. Signs that your baby has successfully latched on to your nipple:  Silent tugging or silent sucking, without causing you pain.  Swallowing heard between every 3-4 sucks.  Muscle movement above and in front of his or her ears while sucking. Signs that your baby has not successfully latched on to nipple:  Sucking sounds or smacking sounds from your baby while breastfeeding.  Nipple pain. If you think your baby has   not latched on correctly, slip your finger into the corner of your baby's mouth to break the suction and place it between your baby's gums. Attempt breastfeeding initiation again. Signs of Successful Breastfeeding Signs from your baby:  A  gradual decrease in the number of sucks or complete cessation of sucking.  Falling asleep.  Relaxation of his or her body.  Retention of a small amount of milk in his or her mouth.  Letting go of your breast by himself or herself. Signs from you:  Breasts that have increased in firmness, weight, and size 1-3 hours after feeding.  Breasts that are softer immediately after breastfeeding.  Increased milk volume, as well as a change in milk consistency and color by the fifth day of breastfeeding.  Nipples that are not sore, cracked, or bleeding. Signs That Your Baby is Getting Enough Milk  Wetting at least 3 diapers in a 24-hour period. The urine should be clear and pale yellow by age 5 days.  At least 3 stools in a 24-hour period by age 5 days. The stool should be soft and yellow.  At least 3 stools in a 24-hour period by age 7 days. The stool should be seedy and yellow.  No loss of weight greater than 10% of birth weight during the first 3 days of age.  Average weight gain of 4-7 ounces (113-198 g) per week after age 4 days.  Consistent daily weight gain by age 5 days, without weight loss after the age of 2 weeks. After a feeding, your baby may spit up a small amount. This is common. BREASTFEEDING FREQUENCY AND DURATION Frequent feeding will help you make more milk and can prevent sore nipples and breast engorgement. Breastfeed when you feel the need to reduce the fullness of your breasts or when your baby shows signs of hunger. This is called "breastfeeding on demand." Avoid introducing a pacifier to your baby while you are working to establish breastfeeding (the first 4-6 weeks after your baby is born). After this time you may choose to use a pacifier. Research has shown that pacifier use during the first year of a baby's life decreases the risk of sudden infant death syndrome (SIDS). Allow your baby to feed on each breast as long as he or she wants. Breastfeed until your baby is  finished feeding. When your baby unlatches or falls asleep while feeding from the first breast, offer the second breast. Because newborns are often sleepy in the first few weeks of life, you may need to awaken your baby to get him or her to feed. Breastfeeding times will vary from baby to baby. However, the following rules can serve as a guide to help you ensure that your baby is properly fed:  Newborns (babies 4 weeks of age or younger) may breastfeed every 1-3 hours.  Newborns should not go longer than 3 hours during the day or 5 hours during the night without breastfeeding.  You should breastfeed your baby a minimum of 8 times in a 24-hour period until you begin to introduce solid foods to your baby at around 6 months of age. BREAST MILK PUMPING Pumping and storing breast milk allows you to ensure that your baby is exclusively fed your breast milk, even at times when you are unable to breastfeed. This is especially important if you are going back to work while you are still breastfeeding or when you are not able to be present during feedings. Your lactation consultant can give you guidelines on   how long it is safe to store breast milk. A breast pump is a machine that allows you to pump milk from your breast into a sterile bottle. The pumped breast milk can then be stored in a refrigerator or freezer. Some breast pumps are operated by hand, while others use electricity. Ask your lactation consultant which type will work best for you. Breast pumps can be purchased, but some hospitals and breastfeeding support groups lease breast pumps on a monthly basis. A lactation consultant can teach you how to hand express breast milk, if you prefer not to use a pump. CARING FOR YOUR BREASTS WHILE YOU BREASTFEED Nipples can become dry, cracked, and sore while breastfeeding. The following recommendations can help keep your breasts moisturized and healthy:  Avoid using soap on your nipples.  Wear a supportive bra.  Although not required, special nursing bras and tank tops are designed to allow access to your breasts for breastfeeding without taking off your entire bra or top. Avoid wearing underwire-style bras or extremely tight bras.  Air dry your nipples for 3-4minutes after each feeding.  Use only cotton bra pads to absorb leaked breast milk. Leaking of breast milk between feedings is normal.  Use lanolin on your nipples after breastfeeding. Lanolin helps to maintain your skin's normal moisture barrier. If you use pure lanolin, you do not need to wash it off before feeding your baby again. Pure lanolin is not toxic to your baby. You may also hand express a few drops of breast milk and gently massage that milk into your nipples and allow the milk to air dry. In the first few weeks after giving birth, some women experience extremely full breasts (engorgement). Engorgement can make your breasts feel heavy, warm, and tender to the touch. Engorgement peaks within 3-5 days after you give birth. The following recommendations can help ease engorgement:  Completely empty your breasts while breastfeeding or pumping. You may want to start by applying warm, moist heat (in the shower or with warm water-soaked hand towels) just before feeding or pumping. This increases circulation and helps the milk flow. If your baby does not completely empty your breasts while breastfeeding, pump any extra milk after he or she is finished.  Wear a snug bra (nursing or regular) or tank top for 1-2 days to signal your body to slightly decrease milk production.  Apply ice packs to your breasts, unless this is too uncomfortable for you.  Make sure that your baby is latched on and positioned properly while breastfeeding. If engorgement persists after 48 hours of following these recommendations, contact your health care provider or a lactation consultant. OVERALL HEALTH CARE RECOMMENDATIONS WHILE BREASTFEEDING  Eat healthy foods.  Alternate between meals and snacks, eating 3 of each per day. Because what you eat affects your breast milk, some of the foods may make your baby more irritable than usual. Avoid eating these foods if you are sure that they are negatively affecting your baby.  Drink milk, fruit juice, and water to satisfy your thirst (about 10 glasses a day).  Rest often, relax, and continue to take your prenatal vitamins to prevent fatigue, stress, and anemia.  Continue breast self-awareness checks.  Avoid chewing and smoking tobacco. Chemicals from cigarettes that pass into breast milk and exposure to secondhand smoke may harm your baby.  Avoid alcohol and drug use, including marijuana. Some medicines that may be harmful to your baby can pass through breast milk. It is important to ask your health care provider   before taking any medicine, including all over-the-counter and prescription medicine as well as vitamin and herbal supplements. It is possible to become pregnant while breastfeeding. If birth control is desired, ask your health care provider about options that will be safe for your baby. SEEK MEDICAL CARE IF:  You feel like you want to stop breastfeeding or have become frustrated with breastfeeding.  You have painful breasts or nipples.  Your nipples are cracked or bleeding.  Your breasts are red, tender, or warm.  You have a swollen area on either breast.  You have a fever or chills.  You have nausea or vomiting.  You have drainage other than breast milk from your nipples.  Your breasts do not become full before feedings by the fifth day after you give birth.  You feel sad and depressed.  Your baby is too sleepy to eat well.  Your baby is having trouble sleeping.   Your baby is wetting less than 3 diapers in a 24-hour period.  Your baby has less than 3 stools in a 24-hour period.  Your baby's skin or the white part of his or her eyes becomes yellow.   Your baby is not gaining  weight by 5 days of age. SEEK IMMEDIATE MEDICAL CARE IF:  Your baby is overly tired (lethargic) and does not want to wake up and feed.  Your baby develops an unexplained fever.   This information is not intended to replace advice given to you by your health care provider. Make sure you discuss any questions you have with your health care provider.   Document Released: 12/22/2004 Document Revised: 09/12/2014 Document Reviewed: 06/15/2012 Elsevier Interactive Patient Education 2016 [employer] Inc[employer]  

## 2015-06-13 NOTE — Progress Notes (Signed)
   Subjective:    Carol Blair is a [id number] 3429w0d being seen today for her first obstetrical visit.  Her obstetrical history is significant for CHTN and obesity. Patient does intend to breast feed. Pregnancy history fully reviewed.  Patient reports no complaints.  Filed Vitals:   06/13/15 0813  BP: 142/89  Pulse: 90  Weight: 260 lb 11.2 oz (118.253 kg)    HISTORY: OB History  Gravida Para Term Preterm AB SAB TAB Ectopic Multiple Living  3 2 2       2     # Outcome Date GA Lbr Len/2nd Weight Sex Delivery Anes PTL Lv  3 Current           2 Term 09/16/11 2718w6d 02:51 / 00:05 6 lb 1.4 oz (2.761 kg) M Vag-Spont None  Y     Comments: wnl  1 Term 04/16/10 2732w0d  6 lb 1.9 oz (2.776 kg) F Vag-Spont EPI  Y     Comments: System Generated. Please review and update pregnancy details.     Past Medical History  Diagnosis Date  . Hypertension   . Obesity   . Benign essential hypertension antepartum 02/11/2011   Past Surgical History  Procedure Laterality Date  . Laparoscopic cholecystectomy w/ cholangiography  06/10/10    Dr Carol Blair   Family History  Problem Relation Age of Onset  . Hypertension Sister   . Lupus Sister   . Diabetes Sister   . Cancer Maternal Grandmother     GI  . Cancer Maternal Aunt     Breast  . Cancer Maternal Uncle     colon     Exam    Uterus:     Pelvic Exam:    Perineum: Normal Perineum   Vulva: normal   Vagina:  normal mucosa, normal discharge   pH:    Cervix: multiparous appearance and closed and long   Adnexa: normal adnexa and no mass, fullness, tenderness   Bony Pelvis: gynecoid  System: Breast:  normal appearance, no masses or tenderness   Skin: normal coloration and turgor, no rashes    Neurologic: oriented, no focal deficits   Extremities: normal strength, tone, and muscle mass   HEENT extra ocular movement intact   Mouth/Teeth mucous membranes moist, pharynx normal without lesions and dental hygiene good   Neck supple and no  masses   Cardiovascular: regular rate and rhythm   Respiratory:  chest clear, no wheezing, crepitations, rhonchi, normal symmetric air entry   Abdomen: soft, non-tender; bowel sounds normal; no masses,  no organomegaly   Urinary:       Assessment:    Pregnancy: [id number] Patient Active Problem List   Diagnosis Date Noted  . Supervision of high risk pregnancy, antepartum 06/13/2015    Priority: Medium  . Chronic hypertension in pregnancy 06/13/2015    Priority: Medium  . Trichomonal vaginitis during pregnancy 05/03/2015  . Galactorrhea 05/10/2013  . ASCUS with positive high risk HPV 04/09/2011  . Hypertension 04/22/2010        Plan:     Initial labs drawn. Prenatal vitamins. Problem list reviewed and updated. Genetic Screening discussed First Screen: ordered.  Ultrasound discussed; fetal survey: requested. Patient with elevated BP today- will start labetalol 200 mg BID Rx ASA provided to start at 12 weeks Baseline labs ordered  Follow up in 4 weeks. 50% of 30 min visit spent on counseling and coordination of care.     Carol Blair 06/13/2015

## 2015-06-13 NOTE — Progress Notes (Signed)
New ob packet given  White discharge with itchiness Early glucola due to BMI >30

## 2015-06-14 LAB — PRENATAL PROFILE (SOLSTAS)
Antibody Screen: NEGATIVE
BASOS ABS: 0 {cells}/uL (ref 0–200)
Basophils Relative: 0 %
EOS ABS: 61 {cells}/uL (ref 15–500)
Eosinophils Relative: 1 %
HCT: 35.4 % (ref 35.0–45.0)
HEP B S AG: NEGATIVE
HIV 1&2 Ab, 4th Generation: NONREACTIVE
Hemoglobin: 11.8 g/dL (ref 11.7–15.5)
LYMPHS ABS: 2074 {cells}/uL (ref 850–3900)
Lymphocytes Relative: 34 %
MCH: 30.5 pg (ref 27.0–33.0)
MCHC: 33.3 g/dL (ref 32.0–36.0)
MCV: 91.5 fL (ref 80.0–100.0)
MONO ABS: 305 {cells}/uL (ref 200–950)
MPV: 11.1 fL (ref 7.5–12.5)
Monocytes Relative: 5 %
NEUTROS ABS: 3660 {cells}/uL (ref 1500–7800)
Neutrophils Relative %: 60 %
PLATELETS: 270 10*3/uL (ref 140–400)
RBC: 3.87 MIL/uL (ref 3.80–5.10)
RDW: 14.4 % (ref 11.0–15.0)
RUBELLA: 4.19 {index} — AB (ref <0.90)
Rh Type: POSITIVE
WBC: 6.1 10*3/uL (ref 3.8–10.8)

## 2015-06-14 LAB — CULTURE, OB URINE
Colony Count: NO GROWTH
Organism ID, Bacteria: NO GROWTH

## 2015-06-14 LAB — WET PREP, GENITAL: Trich, Wet Prep: NONE SEEN

## 2015-06-14 LAB — GC/CHLAMYDIA PROBE AMP (~~LOC~~) NOT AT ARMC
CHLAMYDIA, DNA PROBE: NEGATIVE
NEISSERIA GONORRHEA: NEGATIVE

## 2015-06-14 LAB — GLUCOSE TOLERANCE, 1 HOUR (50G) W/O FASTING: Glucose, 1 Hr, gestational: 90 mg/dL (ref <140)

## 2015-06-14 NOTE — Addendum Note (Signed)
Addended by: Jill SideAY, Jayleana Colberg L on: 06/14/2015 11:05 AM   Modules accepted: Orders

## 2015-06-14 NOTE — Progress Notes (Signed)
Bedside US for viability = Single IUP;  FHR - 166 bpm per M-mode;  FM present

## 2015-06-16 ENCOUNTER — Other Ambulatory Visit: Payer: Self-pay | Admitting: Obstetrics and Gynecology

## 2015-06-16 DIAGNOSIS — O0991 Supervision of high risk pregnancy, unspecified, first trimester: Secondary | ICD-10-CM

## 2015-06-16 MED ORDER — METRONIDAZOLE 500 MG PO TABS: 500.0000 mg | ORAL_TABLET | Freq: Two times a day (BID) | ORAL | Status: AC

## 2015-06-17 LAB — CP5000051 PDM PROFILE
Amphetamines: NEGATIVE ng/mL (ref <500)
BARBITURATES: NEGATIVE ng/mL (ref <300)
Benzodiazepines: NEGATIVE ng/mL (ref <100)
Buprenorphine: NEGATIVE ng/mL (ref <5)
COCAINE METABOLITE: NEGATIVE ng/mL (ref <150)
Desmethyltramadol: NEGATIVE ng/mL (ref <100)
FENTANYL: NEGATIVE ng/mL (ref <0.5)
MARIJUANA METABOLITE: NEGATIVE ng/mL (ref <20)
MDMA: NEGATIVE ng/mL (ref <500)
METHADONE METABOLITE: NEGATIVE ng/mL (ref <100)
Meperidine: NEGATIVE ng/mL (ref <100)
Meprobamate: NEGATIVE ng/mL (ref <1000)
NORFENTANYL: NEGATIVE ng/mL (ref <0.5)
NORMEPERIDINE: NEGATIVE ng/mL (ref <100)
NORTAPENTADOL: NEGATIVE ng/mL (ref <50)
OXYCODONE: NEGATIVE ng/mL (ref <100)
Opiates: NEGATIVE ng/mL (ref <100)
PROPOXYPHENE: NEGATIVE ng/mL (ref <300)
Phencyclidine: NEGATIVE ng/mL (ref <25)
TAPENTADOL: NEGATIVE ng/mL (ref <50)
Tramadol: NEGATIVE ng/mL (ref <100)
ZOLPIDEM METABOLITE: NEGATIVE ng/mL (ref <5)
ZOLPIDEM: NEGATIVE ng/mL (ref <5)

## 2015-06-17 NOTE — Progress Notes (Signed)
Home Medicaid Form Completed  

## 2015-06-18 ENCOUNTER — Telehealth: Payer: Self-pay | Admitting: General Practice

## 2015-06-18 NOTE — Telephone Encounter (Signed)
Pt has been informed BV results and will pick up her prescription today.

## 2015-06-18 NOTE — Telephone Encounter (Signed)
Patient has BV and needs flagyl sent to pharmacy. Called patient, no answer- left message stating we are trying to reach you with results, please call us back at the clinics.

## 2015-06-19 LAB — CBC
HEMATOCRIT: 32.7 % — AB (ref 35.0–45.0)
HEMOGLOBIN: 10.9 g/dL — AB (ref 11.7–15.5)
MCH: 30.7 pg (ref 27.0–33.0)
MCHC: 33.3 g/dL (ref 32.0–36.0)
MCV: 92.1 fL (ref 80.0–100.0)
MPV: 10.8 fL (ref 7.5–12.5)
Platelets: 277 10*3/uL (ref 140–400)
RBC: 3.55 MIL/uL — ABNORMAL LOW (ref 3.80–5.10)
RDW: 14.6 % (ref 11.0–15.0)
WBC: 6.5 10*3/uL (ref 3.8–10.8)

## 2015-06-20 LAB — COMPREHENSIVE METABOLIC PANEL
ALBUMIN: 3.5 g/dL — AB (ref 3.6–5.1)
ALT: 11 U/L (ref 6–29)
AST: 11 U/L (ref 10–30)
Alkaline Phosphatase: 75 U/L (ref 33–115)
BUN: 5 mg/dL — ABNORMAL LOW (ref 7–25)
CALCIUM: 9.2 mg/dL (ref 8.6–10.2)
CHLORIDE: 104 mmol/L (ref 98–110)
CO2: 18 mmol/L — AB (ref 20–31)
Creat: 0.61 mg/dL (ref 0.50–1.10)
GLUCOSE: 84 mg/dL (ref 65–99)
POTASSIUM: 3.9 mmol/L (ref 3.5–5.3)
Sodium: 138 mmol/L (ref 135–146)
Total Bilirubin: 0.4 mg/dL (ref 0.2–1.2)
Total Protein: 6.8 g/dL (ref 6.1–8.1)

## 2015-06-22 LAB — PROTEIN, URINE, 24 HOUR
Protein, 24H Urine: 120 mg/24 h (ref <150)
Protein, Urine: 12 mg/dL (ref 5–24)

## 2015-06-26 ENCOUNTER — Encounter (HOSPITAL_COMMUNITY): Payer: Self-pay | Admitting: Obstetrics and Gynecology

## 2015-07-01 ENCOUNTER — Ambulatory Visit (INDEPENDENT_AMBULATORY_CARE_PROVIDER_SITE_OTHER): Payer: Self-pay | Admitting: *Deleted

## 2015-07-01 DIAGNOSIS — Z111 Encounter for screening for respiratory tuberculosis: Secondary | ICD-10-CM

## 2015-07-01 NOTE — Progress Notes (Signed)
   Tuberculin skin test applied to left ventral forearm.  Patient informed to schedule appt for nurse visit in 48-72 hours to have site read.  ~Mikaili Flippin, BSN, RN-BC   

## 2015-07-03 ENCOUNTER — Encounter: Payer: Self-pay | Admitting: *Deleted

## 2015-07-03 ENCOUNTER — Ambulatory Visit (INDEPENDENT_AMBULATORY_CARE_PROVIDER_SITE_OTHER): Payer: Medicaid Other | Admitting: *Deleted

## 2015-07-03 ENCOUNTER — Ambulatory Visit (HOSPITAL_COMMUNITY)
Admission: RE | Admit: 2015-07-03 | Discharge: 2015-07-03 | Disposition: A | Discharge status: Home / Self Care | Payer: Medicaid Other | Source: Ambulatory Visit | Attending: Obstetrics and Gynecology | Admitting: Obstetrics and Gynecology

## 2015-07-03 ENCOUNTER — Encounter (HOSPITAL_COMMUNITY): Payer: Self-pay

## 2015-07-03 ENCOUNTER — Ambulatory Visit (HOSPITAL_COMMUNITY): Admission: RE | Admit: 2015-07-03 | Payer: Medicaid Other | Source: Ambulatory Visit

## 2015-07-03 VITALS — BP 138/75 | HR 67 | Wt 262.4 lb

## 2015-07-03 DIAGNOSIS — O10011 Pre-existing essential hypertension complicating pregnancy, first trimester: Secondary | ICD-10-CM | POA: Diagnosis not present

## 2015-07-03 DIAGNOSIS — O0991 Supervision of high risk pregnancy, unspecified, first trimester: Secondary | ICD-10-CM

## 2015-07-03 DIAGNOSIS — O99211 Obesity complicating pregnancy, first trimester: Secondary | ICD-10-CM | POA: Insufficient documentation

## 2015-07-03 DIAGNOSIS — Z36 Encounter for antenatal screening of mother: Secondary | ICD-10-CM | POA: Diagnosis not present

## 2015-07-03 DIAGNOSIS — Z7689 Persons encountering health services in other specified circumstances: Secondary | ICD-10-CM

## 2015-07-03 DIAGNOSIS — Z111 Encounter for screening for respiratory tuberculosis: Secondary | ICD-10-CM

## 2015-07-03 DIAGNOSIS — Z3A12 12 weeks gestation of pregnancy: Secondary | ICD-10-CM | POA: Diagnosis not present

## 2015-07-03 DIAGNOSIS — O10919 Unspecified pre-existing hypertension complicating pregnancy, unspecified trimester: Secondary | ICD-10-CM

## 2015-07-03 LAB — TB SKIN TEST
INDURATION: 0 mm
TB SKIN TEST: NEGATIVE

## 2015-07-03 NOTE — Progress Notes (Signed)
   PPD Reading Note PPD read and results entered in EpicCare. Result: 0 mm induration. Interpretation: Negative If test not read within 48-72 hours of initial placement, patient advised to repeat in other arm 1-3 weeks after this test. Allergic reaction: no  Martin, Tamika L, RN  

## 2015-07-11 ENCOUNTER — Ambulatory Visit (INDEPENDENT_AMBULATORY_CARE_PROVIDER_SITE_OTHER): Payer: Medicaid Other | Admitting: Obstetrics & Gynecology

## 2015-07-11 VITALS — BP 126/79 | HR 67 | Wt 260.4 lb

## 2015-07-11 DIAGNOSIS — O10911 Unspecified pre-existing hypertension complicating pregnancy, first trimester: Secondary | ICD-10-CM

## 2015-07-11 LAB — POCT URINALYSIS DIP (DEVICE)
BILIRUBIN URINE: NEGATIVE
Glucose, UA: NEGATIVE mg/dL
HGB URINE DIPSTICK: NEGATIVE
Leukocytes, UA: NEGATIVE
NITRITE: NEGATIVE
PH: 6 (ref 5.0–8.0)
PROTEIN: NEGATIVE mg/dL
Specific Gravity, Urine: 1.025 (ref 1.005–1.030)
UROBILINOGEN UA: 2 mg/dL — AB (ref 0.0–1.0)

## 2015-07-11 NOTE — Progress Notes (Signed)
States takes labetolol twice a day mostly, but sometimes once a day because it makes her nauseous- states not sure if it morning sickness or not because she doesn't usually get morning sickness in other pregnancy.

## 2015-07-11 NOTE — Progress Notes (Signed)
Could not obtain NT on [place] last week   Subjective:  Carol Blair is a 25 y.o. G3P2002 at 1142w0d being seen today for ongoing prenatal care.  She is currently monitored for the following issues for this high-risk pregnancy and has Hypertension; ASCUS with positive high risk HPV; Galactorrhea; Trichomonal vaginitis during pregnancy; Supervision of high risk pregnancy, antepartum; and Chronic hypertension in pregnancy on her problem list.  Patient reports no complaints.  Contractions: Not present. Vag. Bleeding: None.  Movement: Absent. Denies leaking of fluid.   The following portions of the patient's history were reviewed and updated as appropriate: allergies, current medications, past family history, past medical history, past social history, past surgical history and problem list. Problem list updated.  Objective:   Filed Vitals:   07/11/15 0925  BP: 126/79  Pulse: 67  Weight: 260 lb 6.4 oz (118.117 kg)    Fetal Status: Fetal Heart Rate (bpm): 150   Movement: Absent     General:  Alert, oriented and cooperative. Patient is in no acute distress.  Skin: Skin is warm and dry. No rash noted.   Cardiovascular: Normal heart rate noted  Respiratory: Normal respiratory effort, no problems with respiration noted  Abdomen: Soft, gravid, appropriate for gestational age. Pain/Pressure: Absent     Pelvic:  Cervical exam deferred        Extremities: Normal range of motion.  Edema: None  Mental Status: Normal mood and affect. Normal behavior. Normal judgment and thought content.   Urinalysis: Urine Protein: Negative Urine Glucose: Negative  Assessment and Plan:  Pregnancy: G3P2002 at 2642w0d  1. Chronic hypertension in pregnancy, first trimester Well controlled   Preterm labor symptoms and general obstetric precautions including but not limited to vaginal bleeding, contractions, leaking of fluid and fetal movement were reviewed in detail with the patient. Please refer to After Visit Summary  for other counseling recommendations.  Return in about 4 weeks (around 08/08/2015).   Adam PhenixJames G Nyree Applegate, MD    .

## 2015-07-11 NOTE — Patient Instructions (Signed)

## 2015-08-14 ENCOUNTER — Ambulatory Visit (HOSPITAL_COMMUNITY)
Admission: RE | Admit: 2015-08-14 | Discharge: 2015-08-14 | Disposition: A | Discharge status: Home / Self Care | Payer: Medicaid Other | Source: Ambulatory Visit | Attending: Obstetrics and Gynecology | Admitting: Obstetrics and Gynecology

## 2015-08-14 ENCOUNTER — Other Ambulatory Visit (HOSPITAL_COMMUNITY): Payer: Self-pay | Admitting: Maternal and Fetal Medicine

## 2015-08-14 DIAGNOSIS — Z3A18 18 weeks gestation of pregnancy: Secondary | ICD-10-CM | POA: Diagnosis not present

## 2015-08-14 DIAGNOSIS — O10919 Unspecified pre-existing hypertension complicating pregnancy, unspecified trimester: Secondary | ICD-10-CM

## 2015-08-14 DIAGNOSIS — O10912 Unspecified pre-existing hypertension complicating pregnancy, second trimester: Secondary | ICD-10-CM | POA: Insufficient documentation

## 2015-08-14 DIAGNOSIS — Z36 Encounter for antenatal screening of mother: Secondary | ICD-10-CM | POA: Insufficient documentation

## 2015-08-14 DIAGNOSIS — Z1389 Encounter for screening for other disorder: Secondary | ICD-10-CM

## 2015-08-14 DIAGNOSIS — Z363 Encounter for antenatal screening for malformations: Secondary | ICD-10-CM

## 2015-08-15 ENCOUNTER — Ambulatory Visit (INDEPENDENT_AMBULATORY_CARE_PROVIDER_SITE_OTHER): Payer: Medicaid Other | Admitting: Family

## 2015-08-15 DIAGNOSIS — O0992 Supervision of high risk pregnancy, unspecified, second trimester: Secondary | ICD-10-CM

## 2015-08-15 DIAGNOSIS — O23592 Infection of other part of genital tract in pregnancy, second trimester: Secondary | ICD-10-CM | POA: Diagnosis not present

## 2015-08-15 DIAGNOSIS — A5901 Trichomonal vulvovaginitis: Secondary | ICD-10-CM

## 2015-08-15 DIAGNOSIS — O10912 Unspecified pre-existing hypertension complicating pregnancy, second trimester: Secondary | ICD-10-CM

## 2015-08-15 LAB — POCT URINALYSIS DIP (DEVICE)
Glucose, UA: NEGATIVE mg/dL
HGB URINE DIPSTICK: NEGATIVE
Leukocytes, UA: NEGATIVE
Nitrite: NEGATIVE
PH: 6 (ref 5.0–8.0)
Protein, ur: NEGATIVE mg/dL
Specific Gravity, Urine: 1.025 (ref 1.005–1.030)
Urobilinogen, UA: 4 mg/dL — ABNORMAL HIGH (ref 0.0–1.0)

## 2015-08-15 NOTE — Progress Notes (Signed)
Subjective:  Carol Blair is a 25 y.o. G3P2002 at 5436w0d being seen today for ongoing prenatal care.  She is currently monitored for the following issues for this high-risk pregnancy and has Hypertension; ASCUS with positive high risk HPV; Galactorrhea; Trichomonal vaginitis during pregnancy; Supervision of high risk pregnancy, antepartum; and Chronic hypertension in pregnancy on her problem list.  Patient reports no complaints. Discussion regarding patient desiring delayed cord clamping.   Contractions: Not present.  .  Movement: Present. Denies leaking of fluid.   The following portions of the patient's history were reviewed and updated as appropriate: allergies, current medications, past family history, past medical history, past social history, past surgical history and problem list. Problem list updated.  Objective:   Vitals:   08/15/15 1051  BP: 138/70  Pulse: 65  Weight: 257 lb (116.6 kg)    Fetal Status: Fetal Heart Rate (bpm): 144 Fundal Height: 20 cm Movement: Present     General:  Alert, oriented and cooperative. Patient is in no acute distress.  Skin: Skin is warm and dry. No rash noted.   Cardiovascular: Normal heart rate noted  Respiratory: Normal respiratory effort, no problems with respiration noted  Abdomen: Soft, gravid, appropriate for gestational age. Pain/Pressure: Absent     Pelvic:  Cervical exam deferred        Extremities: Normal range of motion.  Edema: None  Mental Status: Normal mood and affect. Normal behavior. Normal judgment and thought content.   Urinalysis: Urine Protein: Trace Urine Glucose: Negative  Assessment and Plan:  Pregnancy: G3P2002 at 5836w0d  1. Supervision of high risk pregnancy, antepartum, second trimester - AFP, Quad Screen  2. Trichomonal vaginitis during pregnancy, second trimester - Wet prep, genital > TOC  3. Chronic hypertension in pregnancy, second trimester - Continue labetalol  Preterm labor symptoms and general  obstetric precautions including but not limited to vaginal bleeding, contractions, leaking of fluid and fetal movement were reviewed in detail with the patient. Please refer to After Visit Summary for other counseling recommendations.  Return in about 3 weeks (around 09/05/2015).   Eino FarberWalidah Kennith GainN Karim, CNM

## 2015-08-16 LAB — WET PREP, GENITAL: Trich, Wet Prep: NONE SEEN

## 2015-08-18 ENCOUNTER — Other Ambulatory Visit: Payer: Self-pay | Admitting: Family

## 2015-08-18 DIAGNOSIS — A5901 Trichomonal vulvovaginitis: Secondary | ICD-10-CM

## 2015-08-18 DIAGNOSIS — B379 Candidiasis, unspecified: Secondary | ICD-10-CM

## 2015-08-18 DIAGNOSIS — B9689 Other specified bacterial agents as the cause of diseases classified elsewhere: Secondary | ICD-10-CM

## 2015-08-18 DIAGNOSIS — O23592 Infection of other part of genital tract in pregnancy, second trimester: Secondary | ICD-10-CM

## 2015-08-18 DIAGNOSIS — N760 Acute vaginitis: Principal | ICD-10-CM

## 2015-08-18 MED ORDER — TERCONAZOLE 0.4 % VA CREA: 1.0000 | TOPICAL_CREAM | Freq: Every day | VAGINAL | 0 refills | Status: DC

## 2015-08-18 MED ORDER — METRONIDAZOLE 500 MG PO TABS: 500.0000 mg | ORAL_TABLET | Freq: Two times a day (BID) | ORAL | 0 refills | Status: DC

## 2015-08-18 NOTE — Progress Notes (Signed)
RX sent to pharmacy for BV and yeast infection.  [facility]

## 2015-08-20 ENCOUNTER — Telehealth: Payer: Self-pay

## 2015-08-20 LAB — AFP, QUAD SCREEN
AFP: 46.6 ng/mL
Curr Gest Age: 19 weeks
HCG, Total: 11.62 IU/mL
INH: 99.7 pg/mL
Interpretation-AFP: NEGATIVE
MOM FOR INH: 0.77
MoM for AFP: 1.23
MoM for hCG: 0.65
OPEN SPINA BIFIDA: NEGATIVE
Tri 18 Scr Risk Est: NEGATIVE
Trisomy 18 (Edward) Syndrome Interp.: 1:57500 {titer}
UE3 MOM: 1.19
UE3 VALUE: 1.61 ng/mL

## 2015-08-20 NOTE — Telephone Encounter (Signed)
Per Carol Blair, pt needs to be treated for BV and yeast.  Notified pt of results she informed me that she has already picked up the medication.

## 2015-09-04 ENCOUNTER — Encounter: Payer: Self-pay | Admitting: Family

## 2015-09-04 ENCOUNTER — Ambulatory Visit (INDEPENDENT_AMBULATORY_CARE_PROVIDER_SITE_OTHER): Payer: Medicaid Other | Admitting: Family

## 2015-09-04 VITALS — BP 136/79 | HR 66 | Wt 262.2 lb

## 2015-09-04 DIAGNOSIS — N2889 Other specified disorders of kidney and ureter: Secondary | ICD-10-CM

## 2015-09-04 DIAGNOSIS — O10912 Unspecified pre-existing hypertension complicating pregnancy, second trimester: Secondary | ICD-10-CM

## 2015-09-04 DIAGNOSIS — O0992 Supervision of high risk pregnancy, unspecified, second trimester: Secondary | ICD-10-CM

## 2015-09-04 DIAGNOSIS — I129 Hypertensive chronic kidney disease with stage 1 through stage 4 chronic kidney disease, or unspecified chronic kidney disease: Secondary | ICD-10-CM

## 2015-09-04 DIAGNOSIS — I151 Hypertension secondary to other renal disorders: Secondary | ICD-10-CM

## 2015-09-04 DIAGNOSIS — N189 Chronic kidney disease, unspecified: Secondary | ICD-10-CM

## 2015-09-04 LAB — POCT URINALYSIS DIP (DEVICE)
Bilirubin Urine: NEGATIVE
GLUCOSE, UA: NEGATIVE mg/dL
Hgb urine dipstick: NEGATIVE
Ketones, ur: NEGATIVE mg/dL
LEUKOCYTES UA: NEGATIVE
NITRITE: NEGATIVE
Protein, ur: NEGATIVE mg/dL
Specific Gravity, Urine: 1.015 (ref 1.005–1.030)
UROBILINOGEN UA: 2 mg/dL — AB (ref 0.0–1.0)
pH: 7.5 (ref 5.0–8.0)

## 2015-09-04 NOTE — Progress Notes (Signed)
   PRENATAL VISIT NOTE  Subjective:  Carol Blair is a 25 y.o. G3P2002 at 6473w6d being seen today for ongoing prenatal care.  She is currently monitored for the following issues for this high-risk pregnancy and has Hypertension; ASCUS with positive high risk HPV; Galactorrhea; Trichomonal vaginitis during pregnancy; Supervision of high risk pregnancy, antepartum; and Chronic hypertension in pregnancy on her problem list.  Patient reports reports increased backpain when walking to class and frequent Braxton Hicks (less than threshold).  Contractions: Not present.  .  Movement: Present. Denies leaking of fluid.   The following portions of the patient's history were reviewed and updated as appropriate: allergies, current medications, past family history, past medical history, past social history, past surgical history and problem list. Problem list updated.  Objective:   Vitals:   09/04/15 0854  BP: 136/79  Pulse: 66  Weight: 262 lb 3.2 oz (118.9 kg)    Fetal Status: Fetal Heart Rate (bpm): 145 Fundal Height: 22 cm Movement: Present     General:  Alert, oriented and cooperative. Patient is in no acute distress.  Skin: Skin is warm and dry. No rash noted.   Cardiovascular: Normal heart rate noted  Respiratory: Normal respiratory effort, no problems with respiration noted  Abdomen: Soft, gravid, appropriate for gestational age. Pain/Pressure: Present     Pelvic:  Cervical exam deferred        Extremities: Normal range of motion.  Edema: None  Mental Status: Normal mood and affect. Normal behavior. Normal judgment and thought content.   Urinalysis: Urine Protein: Negative Urine Glucose: Negative  Assessment and Plan:  Pregnancy: G3P2002 at 1473w6d  1. Hypertension secondary to other renal disorders - US MFM OB FOLLOW UP; Future >growth  2. Chronic hypertension in pregnancy, second trimester - Continue current dose of Labetalol  3. Supervision of high risk pregnancy, antepartum,  second trimester - Given letter to allow online classes at [employer]  Preterm labor symptoms and general obstetric precautions including but not limited to vaginal bleeding, contractions, leaking of fluid and fetal movement were reviewed in detail with the patient. Please refer to After Visit Summary for other counseling recommendations.  Return in about 3 weeks (around 09/25/2015).  Carol Blair, CNM

## 2015-09-04 NOTE — Progress Notes (Unsigned)
Patient would like a note for work/school

## 2015-09-19 ENCOUNTER — Encounter (HOSPITAL_COMMUNITY): Payer: Self-pay

## 2015-09-19 ENCOUNTER — Ambulatory Visit (HOSPITAL_COMMUNITY)
Admission: RE | Admit: 2015-09-19 | Discharge: 2015-09-19 | Disposition: A | Discharge status: Home / Self Care | Payer: Medicaid Other | Source: Ambulatory Visit | Attending: Family | Admitting: Family

## 2015-09-19 ENCOUNTER — Other Ambulatory Visit: Payer: Self-pay | Admitting: Family

## 2015-09-19 DIAGNOSIS — IMO0002 Reserved for concepts with insufficient information to code with codable children: Secondary | ICD-10-CM

## 2015-09-19 DIAGNOSIS — Z3A24 24 weeks gestation of pregnancy: Secondary | ICD-10-CM

## 2015-09-19 DIAGNOSIS — Z0489 Encounter for examination and observation for other specified reasons: Secondary | ICD-10-CM

## 2015-09-19 DIAGNOSIS — I151 Hypertension secondary to other renal disorders: Secondary | ICD-10-CM

## 2015-09-19 DIAGNOSIS — E669 Obesity, unspecified: Secondary | ICD-10-CM | POA: Diagnosis not present

## 2015-09-19 DIAGNOSIS — O10912 Unspecified pre-existing hypertension complicating pregnancy, second trimester: Secondary | ICD-10-CM | POA: Diagnosis not present

## 2015-09-19 DIAGNOSIS — O10019 Pre-existing essential hypertension complicating pregnancy, unspecified trimester: Secondary | ICD-10-CM

## 2015-09-19 DIAGNOSIS — O99212 Obesity complicating pregnancy, second trimester: Secondary | ICD-10-CM | POA: Diagnosis not present

## 2015-09-19 DIAGNOSIS — O10012 Pre-existing essential hypertension complicating pregnancy, second trimester: Secondary | ICD-10-CM | POA: Diagnosis present

## 2015-09-19 DIAGNOSIS — N2889 Other specified disorders of kidney and ureter: Secondary | ICD-10-CM

## 2015-09-19 DIAGNOSIS — O0992 Supervision of high risk pregnancy, unspecified, second trimester: Secondary | ICD-10-CM

## 2015-09-19 DIAGNOSIS — Z36 Encounter for antenatal screening of mother: Secondary | ICD-10-CM | POA: Diagnosis not present

## 2015-09-25 ENCOUNTER — Ambulatory Visit (INDEPENDENT_AMBULATORY_CARE_PROVIDER_SITE_OTHER): Payer: Medicaid Other | Admitting: Family Medicine

## 2015-09-25 VITALS — BP 134/72 | HR 79 | Wt 261.0 lb

## 2015-09-25 DIAGNOSIS — O10912 Unspecified pre-existing hypertension complicating pregnancy, second trimester: Secondary | ICD-10-CM

## 2015-09-25 DIAGNOSIS — Z23 Encounter for immunization: Secondary | ICD-10-CM

## 2015-09-25 DIAGNOSIS — O0992 Supervision of high risk pregnancy, unspecified, second trimester: Secondary | ICD-10-CM

## 2015-09-25 LAB — POCT URINALYSIS DIP (DEVICE)
Bilirubin Urine: NEGATIVE
Glucose, UA: NEGATIVE mg/dL
Hgb urine dipstick: NEGATIVE
Ketones, ur: NEGATIVE mg/dL
NITRITE: NEGATIVE
PH: 6.5 (ref 5.0–8.0)
PROTEIN: NEGATIVE mg/dL
Specific Gravity, Urine: 1.02 (ref 1.005–1.030)
UROBILINOGEN UA: 2 mg/dL — AB (ref 0.0–1.0)

## 2015-09-25 NOTE — Progress Notes (Signed)
   PRENATAL VISIT NOTE  Subjective:  Carol Blair is a 25 y.o. G3P2002 at 5028w6d being seen today for ongoing prenatal care.  She is currently monitored for the following issues for this high-risk pregnancy and has Hypertension; ASCUS with positive high risk HPV; Galactorrhea; Supervision of high risk pregnancy, antepartum; and Chronic hypertension in pregnancy on her problem list.  Patient reports occasional contractions.  Contractions: Irritability. Vag. Bleeding: None.  Movement: Present. Denies leaking of fluid.   CHTN: Taking labetalol and daily aspirin  The following portions of the patient's history were reviewed and updated as appropriate: allergies, current medications, past family history, past medical history, past social history, past surgical history and problem list. Problem list updated.  Objective:   Vitals:   09/25/15 1035  BP: 134/72  Pulse: 79  Weight: 261 lb (118.4 kg)    Fetal Status: Fetal Heart Rate (bpm): 144   Movement: Present     General:  Alert, oriented and cooperative. Patient is in no acute distress.  Skin: Skin is warm and dry. No rash noted.   Cardiovascular: Normal heart rate noted  Respiratory: Normal respiratory effort, no problems with respiration noted  Abdomen: Soft, gravid, appropriate for gestational age. Pain/Pressure: Absent     Pelvic:  Cervical exam deferred        Extremities: Normal range of motion.  Edema: None  Mental Status: Normal mood and affect. Normal behavior. Normal judgment and thought content.   Urinalysis: Urine Protein: Negative Urine Glucose: Negative  Assessment and Plan:  Pregnancy: G3P2002 at 8228w6d  1. Supervision of high risk pregnancy, antepartum, second trimester FHT and Fh normal.  - POCT urinalysis dip (device) - Flu Vaccine QUAD 36+ mos IM (Fluarix, Quad PF)  2. Chronic hypertension in pregnancy, second trimester Continue ASA, labetalol. US 9/14 - EFW 54% (1#7oz)  Preterm labor symptoms and general  obstetric precautions including but not limited to vaginal bleeding, contractions, leaking of fluid and fetal movement were reviewed in detail with the patient. Please refer to After Visit Summary for other counseling recommendations.  No Follow-up on file.  Levie HeritageJacob J Nealie Mchatton, DO

## 2015-09-25 NOTE — Progress Notes (Signed)
Patient reports daily contractions

## 2015-10-17 ENCOUNTER — Encounter (HOSPITAL_COMMUNITY): Payer: Self-pay

## 2015-10-17 ENCOUNTER — Ambulatory Visit (HOSPITAL_COMMUNITY)
Admission: RE | Admit: 2015-10-17 | Discharge: 2015-10-17 | Disposition: A | Discharge status: Home / Self Care | Payer: Medicaid Other | Source: Ambulatory Visit | Attending: Obstetrics & Gynecology | Admitting: Obstetrics & Gynecology

## 2015-10-17 ENCOUNTER — Other Ambulatory Visit (HOSPITAL_COMMUNITY): Payer: Self-pay | Admitting: *Deleted

## 2015-10-17 DIAGNOSIS — Z3A28 28 weeks gestation of pregnancy: Secondary | ICD-10-CM | POA: Diagnosis not present

## 2015-10-17 DIAGNOSIS — O99212 Obesity complicating pregnancy, second trimester: Secondary | ICD-10-CM | POA: Insufficient documentation

## 2015-10-17 DIAGNOSIS — O10013 Pre-existing essential hypertension complicating pregnancy, third trimester: Secondary | ICD-10-CM | POA: Diagnosis not present

## 2015-10-17 DIAGNOSIS — O10912 Unspecified pre-existing hypertension complicating pregnancy, second trimester: Secondary | ICD-10-CM

## 2015-10-17 DIAGNOSIS — O10919 Unspecified pre-existing hypertension complicating pregnancy, unspecified trimester: Secondary | ICD-10-CM

## 2015-10-23 ENCOUNTER — Other Ambulatory Visit: Payer: Self-pay | Admitting: Family

## 2015-10-23 ENCOUNTER — Ambulatory Visit (INDEPENDENT_AMBULATORY_CARE_PROVIDER_SITE_OTHER): Payer: Medicaid Other | Admitting: Family

## 2015-10-23 VITALS — BP 141/81 | HR 70 | Wt 262.4 lb

## 2015-10-23 DIAGNOSIS — O23599 Infection of other part of genital tract in pregnancy, unspecified trimester: Secondary | ICD-10-CM

## 2015-10-23 DIAGNOSIS — O0990 Supervision of high risk pregnancy, unspecified, unspecified trimester: Secondary | ICD-10-CM

## 2015-10-23 DIAGNOSIS — O0993 Supervision of high risk pregnancy, unspecified, third trimester: Secondary | ICD-10-CM

## 2015-10-23 DIAGNOSIS — O10919 Unspecified pre-existing hypertension complicating pregnancy, unspecified trimester: Secondary | ICD-10-CM

## 2015-10-23 DIAGNOSIS — Z23 Encounter for immunization: Secondary | ICD-10-CM | POA: Diagnosis present

## 2015-10-23 DIAGNOSIS — O10913 Unspecified pre-existing hypertension complicating pregnancy, third trimester: Secondary | ICD-10-CM

## 2015-10-23 DIAGNOSIS — A5901 Trichomonal vulvovaginitis: Secondary | ICD-10-CM | POA: Insufficient documentation

## 2015-10-23 DIAGNOSIS — O23593 Infection of other part of genital tract in pregnancy, third trimester: Secondary | ICD-10-CM

## 2015-10-23 LAB — CBC
HEMATOCRIT: 33.5 % — AB (ref 35.0–45.0)
HEMOGLOBIN: 11.1 g/dL — AB (ref 11.7–15.5)
MCH: 30.7 pg (ref 27.0–33.0)
MCHC: 33.1 g/dL (ref 32.0–36.0)
MCV: 92.5 fL (ref 80.0–100.0)
MPV: 11.2 fL (ref 7.5–12.5)
Platelets: 245 10*3/uL (ref 140–400)
RBC: 3.62 MIL/uL — AB (ref 3.80–5.10)
RDW: 13 % (ref 11.0–15.0)
WBC: 8.3 10*3/uL (ref 3.8–10.8)

## 2015-10-23 LAB — POCT URINALYSIS DIP (DEVICE)
BILIRUBIN URINE: NEGATIVE
GLUCOSE, UA: NEGATIVE mg/dL
Hgb urine dipstick: NEGATIVE
KETONES UR: NEGATIVE mg/dL
NITRITE: NEGATIVE
Protein, ur: NEGATIVE mg/dL
Specific Gravity, Urine: 1.015 (ref 1.005–1.030)
Urobilinogen, UA: 1 mg/dL (ref 0.0–1.0)
pH: 6.5 (ref 5.0–8.0)

## 2015-10-23 LAB — WET PREP, GENITAL
CLUE CELLS WET PREP: NONE SEEN
TRICH WET PREP: NONE SEEN

## 2015-10-23 MED ORDER — TETANUS-DIPHTH-ACELL PERTUSSIS 5-2.5-18.5 LF-MCG/0.5 IM SUSP
0.5000 mL | Freq: Once | INTRAMUSCULAR | Status: AC
  Administered 2015-10-23: 0.5 mL via INTRAMUSCULAR

## 2015-10-23 NOTE — Patient Instructions (Addendum)
Third Trimester of Pregnancy The third trimester is from week 29 through week 42, months 7 through 9. The third trimester is a time when the fetus is growing rapidly. At the end of the ninth month, the fetus is about 20 inches in length and weighs 6-10 pounds.  BODY CHANGES Your body goes through many changes during pregnancy. The changes vary from woman to woman.   Your weight will continue to increase. You can expect to gain 25-35 pounds (11-16 kg) by the end of the pregnancy.  You may begin to get stretch marks on your hips, abdomen, and breasts.  You may urinate more often because the fetus is moving lower into your pelvis and pressing on your bladder.  You may develop or continue to have heartburn as a result of your pregnancy.  You may develop constipation because certain hormones are causing the muscles that push waste through your intestines to slow down.  You may develop hemorrhoids or swollen, bulging veins (varicose veins).  You may have pelvic pain because of the weight gain and pregnancy hormones relaxing your joints between the bones in your pelvis. Backaches may result from overexertion of the muscles supporting your posture.  You may have changes in your hair. These can include thickening of your hair, rapid growth, and changes in texture. Some women also have hair loss during or after pregnancy, or hair that feels dry or thin. Your hair will most likely return to normal after your baby is born.  Your breasts will continue to grow and be tender. A yellow discharge may leak from your breasts called colostrum.  Your belly button may stick out.  You may feel short of breath because of your expanding uterus.  You may notice the fetus "dropping," or moving lower in your abdomen.  You may have a bloody mucus discharge. This usually occurs a few days to a week before labor begins.  Your cervix becomes thin and soft (effaced) near your due date. WHAT TO EXPECT AT YOUR PRENATAL  EXAMS  You will have prenatal exams every 2 weeks until week 36. Then, you will have weekly prenatal exams. During a routine prenatal visit:  You will be weighed to make sure you and the fetus are growing normally.  Your blood pressure is taken.  Your abdomen will be measured to track your baby's growth.  The fetal heartbeat will be listened to.  Any test results from the previous visit will be discussed.  You may have a cervical check near your due date to see if you have effaced. At around 36 weeks, your caregiver will check your cervix. At the same time, your caregiver will also perform a test on the secretions of the vaginal tissue. This test is to determine if a type of bacteria, Group B streptococcus, is present. Your caregiver will explain this further. Your caregiver may ask you:  What your birth plan is.  How you are feeling.  If you are feeling the baby move.  If you have had any abnormal symptoms, such as leaking fluid, bleeding, severe headaches, or abdominal cramping.  If you are using any tobacco products, including cigarettes, chewing tobacco, and electronic cigarettes.  If you have any questions. Other tests or screenings that may be performed during your third trimester include:  Blood tests that check for low iron levels (anemia).  Fetal testing to check the health, activity level, and growth of the fetus. Testing is done if you have certain medical conditions or if   there are problems during the pregnancy.  HIV (human immunodeficiency virus) testing. If you are at high risk, you may be screened for HIV during your third trimester of pregnancy. FALSE LABOR You may feel small, irregular contractions that eventually go away. These are called Braxton Hicks contractions, or false labor. Contractions may last for hours, days, or even weeks before true labor sets in. If contractions come at regular intervals, intensify, or become painful, it is best to be seen by your  caregiver.  SIGNS OF LABOR   Menstrual-like cramps.  Contractions that are 5 minutes apart or less.  Contractions that start on the top of the uterus and spread down to the lower abdomen and back.  A sense of increased pelvic pressure or back pain.  A watery or bloody mucus discharge that comes from the vagina. If you have any of these signs before the 37th week of pregnancy, call your caregiver right away. You need to go to the hospital to get checked immediately. HOME CARE INSTRUCTIONS   Avoid all smoking, herbs, alcohol, and unprescribed drugs. These chemicals affect the formation and growth of the baby.  Do not use any tobacco products, including cigarettes, chewing tobacco, and electronic cigarettes. If you need help quitting, ask your health care provider. You may receive counseling support and other resources to help you quit.  Follow your caregiver's instructions regarding medicine use. There are medicines that are either safe or unsafe to take during pregnancy.  Exercise only as directed by your caregiver. Experiencing uterine cramps is a good sign to stop exercising.  Continue to eat regular, healthy meals.  Wear a good support bra for breast tenderness.  Do not use hot tubs, steam rooms, or saunas.  Wear your seat belt at all times when driving.  Avoid raw meat, uncooked cheese, cat litter boxes, and soil used by cats. These carry germs that can cause birth defects in the baby.  Take your prenatal vitamins.  Take 1500-2000 mg of calcium daily starting at the 20th week of pregnancy until you deliver your baby.  Try taking a stool softener (if your caregiver approves) if you develop constipation. Eat more high-fiber foods, such as fresh vegetables or fruit and whole grains. Drink plenty of fluids to keep your urine clear or pale yellow.  Take warm sitz baths to soothe any pain or discomfort caused by hemorrhoids. Use hemorrhoid cream if your caregiver approves.  If  you develop varicose veins, wear support hose. Elevate your feet for 15 minutes, 3-4 times a day. Limit salt in your diet.  Avoid heavy lifting, wear low heal shoes, and practice good posture.  Rest a lot with your legs elevated if you have leg cramps or low back pain.  Visit your dentist if you have not gone during your pregnancy. Use a soft toothbrush to brush your teeth and be gentle when you floss.  A sexual relationship may be continued unless your caregiver directs you otherwise.  Do not travel far distances unless it is absolutely necessary and only with the approval of your caregiver.  Take prenatal classes to understand, practice, and ask questions about the labor and delivery.  Make a trial run to the hospital.  Pack your hospital bag.  Prepare the baby's nursery.  Continue to go to all your prenatal visits as directed by your caregiver. SEEK MEDICAL CARE IF:  You are unsure if you are in labor or if your water has broken.  You have dizziness.  You have   mild pelvic cramps, pelvic pressure, or nagging pain in your abdominal area.  You have persistent nausea, vomiting, or diarrhea.  You have a bad smelling vaginal discharge.  You have pain with urination. SEEK IMMEDIATE MEDICAL CARE IF:   You have a fever.  You are leaking fluid from your vagina.  You have spotting or bleeding from your vagina.  You have severe abdominal cramping or pain.  You have rapid weight loss or gain.  You have shortness of breath with chest pain.  You notice sudden or extreme swelling of your face, hands, ankles, feet, or legs.  You have not felt your baby move in over an hour.  You have severe headaches that do not go away with medicine.  You have vision changes.   This information is not intended to replace advice given to you by your health care provider. Make sure you discuss any questions you have with your health care provider.   Document Released: 12/16/2000 Document  Revised: 01/12/2014 Document Reviewed: 02/23/2012 Elsevier Interactive Patient Education 2016 [employer] Hypertension During Pregnancy Hypertension, or high blood pressure, is when there is extra pressure inside your blood vessels that carry blood from the heart to the rest of your body (arteries). It can happen at any time in life, including pregnancy. Hypertension during pregnancy can cause problems for you and your baby. Your baby might not weigh as much as he or she should at birth or might be born early (premature). Very bad cases of hypertension during pregnancy can be life-threatening.  Different types of hypertension can occur during pregnancy. These include:  Chronic hypertension. This happens when a woman has hypertension before pregnancy and it continues during pregnancy.  Gestational hypertension. This is when hypertension develops during pregnancy.  Preeclampsia or toxemia of pregnancy. This is a very serious type of hypertension that develops only during pregnancy. It affects the whole body and can be very dangerous for both mother and baby.  Gestational hypertension and preeclampsia usually go away after your baby is born. Your blood pressure will likely stabilize within 6 weeks. Women who have hypertension during pregnancy have a greater chance of developing hypertension later in life or with future pregnancies. RISK FACTORS There are certain factors that make it more likely for you to develop hypertension during pregnancy. These include:  Having hypertension before pregnancy.  Having hypertension during a previous pregnancy.  Being overweight.  Being older than 40 years.  Being pregnant with more than one baby.  Having diabetes or kidney problems. SIGNS AND SYMPTOMS Chronic and gestational hypertension rarely cause symptoms. Preeclampsia has symptoms, which may include:  Increased protein in your urine. Your health care provider will check for this at every prenatal  visit.  Swelling of your hands and face.  Rapid weight gain.  Headaches.  Visual changes.  Being bothered by light.  Abdominal pain, especially in the upper right area.  Chest pain.  Shortness of breath.  Increased reflexes.  Seizures. These occur with a more severe form of preeclampsia, called eclampsia. DIAGNOSIS  You may be diagnosed with hypertension during a regular prenatal exam. At each prenatal visit, you may have:  Your blood pressure checked.  A urine test to check for protein in your urine. The type of hypertension you are diagnosed with depends on when you developed it. It also depends on your specific blood pressure reading.  Developing hypertension before 20 weeks of pregnancy is consistent with chronic hypertension.  Developing hypertension after 20 weeks of pregnancy   is consistent with gestational hypertension.  Hypertension with increased urinary protein is diagnosed as preeclampsia.  Blood pressure measurements that stay above 160 systolic or 110 diastolic are a sign of severe preeclampsia. TREATMENT Treatment for hypertension during pregnancy varies. Treatment depends on the type of hypertension and how serious it is.  If you take medicine for chronic hypertension, you may need to switch medicines.  Medicines called ACE inhibitors should not be taken during pregnancy.  Low-dose aspirin may be suggested for women who have risk factors for preeclampsia.  If you have gestational hypertension, you may need to take a blood pressure medicine that is safe during pregnancy. Your health care provider will recommend the correct medicine.  If you have severe preeclampsia, you may need to be in the hospital. Health care providers will watch you and your baby very closely. You also may need to take medicine called magnesium sulfate to prevent seizures and lower blood pressure.  Sometimes, an early delivery is needed. This may be the case if the condition  worsens. It would be done to protect you and your baby. The only cure for preeclampsia is delivery.  Your health care provider may recommend that you take one low-dose aspirin (81 mg) each day to help prevent high blood pressure during your pregnancy if you are at risk for preeclampsia. You may be at risk for preeclampsia if:  You had preeclampsia or eclampsia during a previous pregnancy.  Your baby did not grow as expected during a previous pregnancy.  You experienced preterm birth with a previous pregnancy.  You experienced a separation of the placenta from the uterus (placental abruption) during a previous pregnancy.  You experienced the loss of your baby during a previous pregnancy.  You are pregnant with more than one baby.  You have other medical conditions, such as diabetes or an autoimmune disease. HOME CARE INSTRUCTIONS  Schedule and keep all of your regular prenatal care appointments. This is important.  Take medicines only as directed by your health care provider. Tell your health care provider about all medicines you take.  Eat as little salt as possible.  Get regular exercise.  Do not drink alcohol.  Do not use tobacco products.  Do not drink products with caffeine.  Lie on your left side when resting. SEEK IMMEDIATE MEDICAL CARE IF:  You have severe abdominal pain.  You have sudden swelling in your hands, ankles, or face.  You gain 4 pounds (1.8 kg) or more in 1 week.  You vomit repeatedly.  You have vaginal bleeding.  You do not feel your baby moving as much.  You have a headache.  You have blurred or double vision.  You have muscle twitching or spasms.  You have shortness of breath.  You have blue fingernails or lips.  You have blood in your urine. MAKE SURE YOU:  Understand these instructions.  Will watch your condition.  Will get help right away if you are not doing well or get worse.   This information is not intended to replace  advice given to you by your health care provider. Make sure you discuss any questions you have with your health care provider.   Document Released: 09/09/2010 Document Revised: 01/12/2014 Document Reviewed: 07/21/2012 Elsevier Interactive Patient Education 2016 [employer]  

## 2015-10-23 NOTE — Assessment & Plan Note (Addendum)
   PRENATAL VISIT NOTE  Subjective:  Carol Blair is a 25 y.o. G3P2002 at 1069w6d being seen today for ongoing prenatal care.  She is currently monitored for the following issues for this high-risk pregnancy and has Hypertension; ASCUS with positive high risk HPV; Galactorrhea; Supervision of high risk pregnancy, antepartum; Chronic hypertension in pregnancy; and Trichomonal vaginitis during pregnancy, antepartum on her problem list.  Patient reports that she has some pain in her left leg at night.  Contractions: Irregular. Vag. Bleeding: None.  Movement: Present. Denies leaking of fluid. Denies headache, blurry vision, floating spots.   The following portions of the patient's history were reviewed and updated as appropriate: allergies, current medications, past family history, past medical history, past social history, past surgical history and problem list. Problem list updated.  Objective:   Vitals:   10/23/15 0856 10/23/15 0900  BP: (!) 146/81 (!) 141/81  Pulse: 70   Weight: 262 lb 6.4 oz (119 kg)     Fetal Status: Fetal Heart Rate (bpm): 144   Movement: Present     General:  Alert, oriented and cooperative. Patient is in no acute distress.  Skin: Skin is warm and dry. No rash noted.   Cardiovascular: Normal heart rate noted  Respiratory: Normal respiratory effort, no problems with respiration noted  Abdomen: Soft, gravid, appropriate for gestational age. Pain/Pressure: Present     Pelvic:  Cervical exam deferred        Extremities: Normal range of motion.  Edema: None  Mental Status: Normal mood and affect. Normal behavior. Normal judgment and thought content.   Assessment and Plan:  Pregnancy: G3P2002 at 2269w6d  1. Chronic hypertension in pregnancy -reviewed diet and exercise with patient and blood pressure monitor at home -will adjust timing of 2nd labetalol dose - Glucose Tolerance, 1 HR (50g) - CBC - RPR - HIV antibody  2. Supervision of high risk pregnancy,  antepartum  - Glucose Tolerance, 1 HR (50g) - CBC - RPR - HIV antibody  3. Trichomonal vaginitis during pregnancy, antepartum TOC on 10/23/2015  4. Hip pain-recommended belly band, stretching and exercise to relieve hip pain. Ok to take tylenol for pain relief.  Term labor symptoms and general obstetric precautions including but not limited to vaginal bleeding, contractions, leaking of fluid and fetal movement were reviewed in detail with the patient. Please refer to After Visit Summary for other counseling recommendations.  Return to clinic in two weeks.  No Follow-up on file.  Marylene LandKathryn Lorraine Emila Steinhauser, CNM

## 2015-10-23 NOTE — Progress Notes (Addendum)
   PRENATAL VISIT NOTE  Subjective:  Carol Blair is a 25 y.o. G3P2002 at 95105w6d being seen today for ongoing prenatal care.  She is currently monitored for the following issues for this high risk pregnancy  pregnancy and has Hypertension; ASCUS with positive high risk HPV; Galactorrhea; Supervision of high risk pregnancy, antepartum; Chronic hypertension in pregnancy; and Trichomonal vaginitis during pregnancy, antepartum on her problem list.  Patient reports some pain in her left hip.  Contractions: Irregular. Vag. Bleeding: None.  Movement: Present. Denies leaking of fluid. Denies blurry vision, headache, epigastric pain.   The following portions of the patient's history were reviewed and updated as appropriate: allergies, current medications, past family history, past medical history, past social history, past surgical history and problem list. Problem list updated.  Objective:   Vitals:   10/23/15 0856 10/23/15 0900  BP: (!) 146/81 (!) 141/81  Pulse: 70   Weight: 262 lb 6.4 oz (119 kg)     Fetal Status: Fetal Heart Rate (bpm): 144   Movement: Present     General:  Alert, oriented and cooperative. Patient is in no acute distress.  Skin: Skin is warm and dry. No rash noted.   Cardiovascular: Normal heart rate noted  Respiratory: Normal respiratory effort, no problems with respiration noted  Abdomen: Soft, gravid, appropriate for gestational age. Pain/Pressure: Present     Pelvic:  Cervical exam deferred        Extremities: Normal range of motion.  Edema: None  Mental Status: Normal mood and affect. Normal behavior. Normal judgment and thought content.   Assessment and Plan:  Pregnancy: G3P2002 at 73105w6d  1. Chronic hypertension in pregnancy -patient has missed two of her evening doses of labetalol due to vomiting. Suggest that she take her does earlier in the day with food and also check her blood pressure when she has vomiting.  - Glucose Tolerance, 1 HR (50g) - CBC - RPR -  HIV antibody - Weekly BPP ordered  2. Supervision of high risk pregnancy, antepartum - Glucose Tolerance, 1 HR (50g) - CBC - RPR - HIV antibody  3. Trichomonal vaginitis during pregnancy, antepartum TOC today on 10/18  4. Hip pain Recommended increased exercise, stretching and belly band to relieve hip pain.    Please refer to After Visit Summary for other counseling recommendations.  Return in 2 weeks (on 11/06/2015).  Term labor symptoms and general obstetric precautions including but not limited to vaginal bleeding, contractions, leaking of fluid and fetal movement were reviewed in detail with the patient. Signs and sympotoms of preeclampsia reviewed.    Marylene LandKathryn Lorraine Nashiya Disbrow, CNM

## 2015-10-23 NOTE — Progress Notes (Signed)
States taking labetolol bid but sometimes throws up after second dose. C/o hip pain, especially left hip.

## 2015-10-24 LAB — GLUCOSE TOLERANCE, 1 HOUR (50G) W/O FASTING: GLUCOSE, 1 HR, GESTATIONAL: 89 mg/dL (ref <140)

## 2015-10-24 LAB — RPR

## 2015-10-24 LAB — HIV ANTIBODY (ROUTINE TESTING W REFLEX): HIV: NONREACTIVE

## 2015-10-30 ENCOUNTER — Telehealth: Payer: Self-pay | Admitting: *Deleted

## 2015-10-30 ENCOUNTER — Other Ambulatory Visit: Payer: Self-pay | Admitting: Family

## 2015-10-30 DIAGNOSIS — O10919 Unspecified pre-existing hypertension complicating pregnancy, unspecified trimester: Secondary | ICD-10-CM

## 2015-10-30 NOTE — Telephone Encounter (Signed)
I called Carol BradfordKimberly and left a message on her home phone number voicemail that she has an us appt tomorrow at 8, call if questions. I also called her mobile number- a female answered and he have me her home number to call.

## 2015-10-30 NOTE — Telephone Encounter (Signed)
Per order from Rochele PagesWalidah Karim, CNM needs weekly bpp until 32 weeks. appt made for 10/31/15 0800 and following 2 weeks.

## 2015-10-31 ENCOUNTER — Ambulatory Visit (HOSPITAL_COMMUNITY)
Admission: RE | Admit: 2015-10-31 | Discharge: 2015-10-31 | Disposition: A | Discharge status: Home / Self Care | Payer: Medicaid Other | Source: Ambulatory Visit | Attending: Radiology | Admitting: Radiology

## 2015-10-31 ENCOUNTER — Other Ambulatory Visit: Payer: Self-pay | Admitting: Family

## 2015-10-31 ENCOUNTER — Encounter (HOSPITAL_COMMUNITY): Payer: Self-pay

## 2015-10-31 DIAGNOSIS — Z3A30 30 weeks gestation of pregnancy: Secondary | ICD-10-CM | POA: Diagnosis not present

## 2015-10-31 DIAGNOSIS — O10013 Pre-existing essential hypertension complicating pregnancy, third trimester: Secondary | ICD-10-CM | POA: Diagnosis not present

## 2015-10-31 DIAGNOSIS — O99213 Obesity complicating pregnancy, third trimester: Secondary | ICD-10-CM

## 2015-10-31 DIAGNOSIS — O10919 Unspecified pre-existing hypertension complicating pregnancy, unspecified trimester: Secondary | ICD-10-CM

## 2015-11-07 ENCOUNTER — Encounter (HOSPITAL_COMMUNITY): Payer: Self-pay

## 2015-11-07 ENCOUNTER — Other Ambulatory Visit: Payer: Self-pay | Admitting: Family

## 2015-11-07 ENCOUNTER — Ambulatory Visit (HOSPITAL_COMMUNITY)
Admission: RE | Admit: 2015-11-07 | Discharge: 2015-11-07 | Disposition: A | Discharge status: Home / Self Care | Payer: Medicaid Other | Source: Ambulatory Visit | Attending: Family | Admitting: Family

## 2015-11-07 DIAGNOSIS — Z3A31 31 weeks gestation of pregnancy: Secondary | ICD-10-CM

## 2015-11-07 DIAGNOSIS — O99213 Obesity complicating pregnancy, third trimester: Secondary | ICD-10-CM | POA: Diagnosis not present

## 2015-11-07 DIAGNOSIS — A5901 Trichomonal vulvovaginitis: Secondary | ICD-10-CM

## 2015-11-07 DIAGNOSIS — O23599 Infection of other part of genital tract in pregnancy, unspecified trimester: Secondary | ICD-10-CM

## 2015-11-07 DIAGNOSIS — O10013 Pre-existing essential hypertension complicating pregnancy, third trimester: Secondary | ICD-10-CM | POA: Insufficient documentation

## 2015-11-07 DIAGNOSIS — O0990 Supervision of high risk pregnancy, unspecified, unspecified trimester: Secondary | ICD-10-CM

## 2015-11-07 DIAGNOSIS — O10919 Unspecified pre-existing hypertension complicating pregnancy, unspecified trimester: Secondary | ICD-10-CM

## 2015-11-08 ENCOUNTER — Ambulatory Visit (INDEPENDENT_AMBULATORY_CARE_PROVIDER_SITE_OTHER): Payer: Medicaid Other | Admitting: Obstetrics and Gynecology

## 2015-11-08 ENCOUNTER — Encounter: Payer: Self-pay | Admitting: Obstetrics and Gynecology

## 2015-11-08 ENCOUNTER — Other Ambulatory Visit (HOSPITAL_COMMUNITY): Payer: Self-pay | Admitting: *Deleted

## 2015-11-08 VITALS — BP 129/72 | HR 65 | Wt 261.2 lb

## 2015-11-08 DIAGNOSIS — O10913 Unspecified pre-existing hypertension complicating pregnancy, third trimester: Secondary | ICD-10-CM

## 2015-11-08 DIAGNOSIS — O0990 Supervision of high risk pregnancy, unspecified, unspecified trimester: Secondary | ICD-10-CM

## 2015-11-08 DIAGNOSIS — O10919 Unspecified pre-existing hypertension complicating pregnancy, unspecified trimester: Secondary | ICD-10-CM

## 2015-11-08 NOTE — Progress Notes (Signed)
Subjective:  Carol Blair is a 25 y.o. G3P2002 at 3641w1d being seen today for ongoing prenatal care.  She is currently monitored for the following issues for this high-risk pregnancy and has Hypertension; ASCUS with positive high risk HPV; Galactorrhea; Supervision of high risk pregnancy, antepartum; and Chronic hypertension in pregnancy on her problem list.  Patient reports some hip pain.  Contractions: Irregular. Vag. Bleeding: None.  Movement: Present. Denies leaking of fluid.   The following portions of the patient's history were reviewed and updated as appropriate: allergies, current medications, past family history, past medical history, past social history, past surgical history and problem list. Problem list updated.  Objective:   Vitals:   11/08/15 1001  BP: 129/72  Pulse: 65  Weight: 261 lb 3.2 oz (118.5 kg)    Fetal Status: Fetal Heart Rate (bpm): 147   Movement: Present     General:  Alert, oriented and cooperative. Patient is in no acute distress.  Skin: Skin is warm and dry. No rash noted.   Cardiovascular: Normal heart rate noted  Respiratory: Normal respiratory effort, no problems with respiration noted  Abdomen: Soft, gravid, appropriate for gestational age. Pain/Pressure: Present     Pelvic:  Cervical exam deferred        Extremities: Normal range of motion.  Edema: None  Mental Status: Normal mood and affect. Normal behavior. Normal judgment and thought content.   Urinalysis:      Assessment and Plan:  Pregnancy: G3P2002 at 8541w1d  1. Chronic hypertension in pregnancy BP stable. Continue with current management and with weekly BPP  2. Supervision of high risk pregnancy, antepartum   Preterm labor symptoms and general obstetric precautions including but not limited to vaginal bleeding, contractions, leaking of fluid and fetal movement were reviewed in detail with the patient. Please refer to After Visit Summary for other counseling recommendations.  Return  in about 2 weeks (around 11/22/2015).   Hermina StaggersMichael L Madeeha Costantino, MD

## 2015-11-13 ENCOUNTER — Encounter (HOSPITAL_COMMUNITY): Payer: Self-pay

## 2015-11-14 ENCOUNTER — Ambulatory Visit (HOSPITAL_COMMUNITY)
Admission: RE | Admit: 2015-11-14 | Discharge: 2015-11-14 | Disposition: A | Discharge status: Home / Self Care | Payer: Medicaid Other | Source: Ambulatory Visit | Attending: Obstetrics & Gynecology | Admitting: Obstetrics & Gynecology

## 2015-11-14 ENCOUNTER — Encounter (HOSPITAL_COMMUNITY): Payer: Self-pay

## 2015-11-14 DIAGNOSIS — O10013 Pre-existing essential hypertension complicating pregnancy, third trimester: Secondary | ICD-10-CM | POA: Insufficient documentation

## 2015-11-14 DIAGNOSIS — O99213 Obesity complicating pregnancy, third trimester: Secondary | ICD-10-CM | POA: Insufficient documentation

## 2015-11-14 DIAGNOSIS — Z3A32 32 weeks gestation of pregnancy: Secondary | ICD-10-CM | POA: Diagnosis not present

## 2015-11-14 DIAGNOSIS — O10919 Unspecified pre-existing hypertension complicating pregnancy, unspecified trimester: Secondary | ICD-10-CM

## 2015-11-14 DIAGNOSIS — O0990 Supervision of high risk pregnancy, unspecified, unspecified trimester: Secondary | ICD-10-CM

## 2015-11-22 ENCOUNTER — Ambulatory Visit (INDEPENDENT_AMBULATORY_CARE_PROVIDER_SITE_OTHER): Payer: Medicaid Other | Admitting: Family Medicine

## 2015-11-22 VITALS — BP 145/75 | HR 68 | Wt 265.9 lb

## 2015-11-22 DIAGNOSIS — O0993 Supervision of high risk pregnancy, unspecified, third trimester: Secondary | ICD-10-CM

## 2015-11-22 DIAGNOSIS — O10913 Unspecified pre-existing hypertension complicating pregnancy, third trimester: Secondary | ICD-10-CM

## 2015-11-22 DIAGNOSIS — O10919 Unspecified pre-existing hypertension complicating pregnancy, unspecified trimester: Secondary | ICD-10-CM

## 2015-11-22 DIAGNOSIS — O0990 Supervision of high risk pregnancy, unspecified, unspecified trimester: Secondary | ICD-10-CM

## 2015-11-22 LAB — CBC
HEMATOCRIT: 35.7 % (ref 35.0–45.0)
HEMOGLOBIN: 11.6 g/dL — AB (ref 11.7–15.5)
MCH: 30.8 pg (ref 27.0–33.0)
MCHC: 32.5 g/dL (ref 32.0–36.0)
MCV: 94.7 fL (ref 80.0–100.0)
MPV: 11.7 fL (ref 7.5–12.5)
PLATELETS: 231 10*3/uL (ref 140–400)
RBC: 3.77 MIL/uL — AB (ref 3.80–5.10)
RDW: 13.6 % (ref 11.0–15.0)
WBC: 7 10*3/uL (ref 3.8–10.8)

## 2015-11-22 LAB — COMPREHENSIVE METABOLIC PANEL
ALBUMIN: 3.4 g/dL — AB (ref 3.6–5.1)
ALK PHOS: 169 U/L — AB (ref 33–115)
ALT: 17 U/L (ref 6–29)
AST: 15 U/L (ref 10–30)
BUN: 5 mg/dL — AB (ref 7–25)
CO2: 26 mmol/L (ref 20–31)
Calcium: 9.4 mg/dL (ref 8.6–10.2)
Chloride: 104 mmol/L (ref 98–110)
Creat: 0.61 mg/dL (ref 0.50–1.10)
Glucose, Bld: 83 mg/dL (ref 65–99)
POTASSIUM: 4.1 mmol/L (ref 3.5–5.3)
Sodium: 137 mmol/L (ref 135–146)
TOTAL PROTEIN: 6.7 g/dL (ref 6.1–8.1)
Total Bilirubin: 0.5 mg/dL (ref 0.2–1.2)

## 2015-11-22 LAB — POCT URINALYSIS DIP (DEVICE)
GLUCOSE, UA: NEGATIVE mg/dL
Hgb urine dipstick: NEGATIVE
Nitrite: NEGATIVE
Protein, ur: 30 mg/dL — AB
SPECIFIC GRAVITY, URINE: 1.025 (ref 1.005–1.030)
UROBILINOGEN UA: 4 mg/dL — AB (ref 0.0–1.0)
pH: 7 (ref 5.0–8.0)

## 2015-11-22 MED ORDER — LABETALOL HCL 200 MG PO TABS: 200.0000 mg | ORAL_TABLET | Freq: Two times a day (BID) | ORAL | 3 refills | Status: DC

## 2015-11-22 NOTE — Progress Notes (Signed)
   PRENATAL VISIT NOTE  Subjective:  Carol Blair is a 25 y.o. G3P2002 at 2552w1d being seen today for ongoing prenatal care.  She is currently monitored for the following issues for this high-risk pregnancy and has Hypertension; ASCUS with positive high risk HPV; Galactorrhea; Supervision of high risk pregnancy, antepartum; and Chronic hypertension in pregnancy on her problem list.  Patient reports no complaints.  Contractions: Irregular. Vag. Bleeding: None.  Movement: Present. Denies leaking of fluid.   The following portions of the patient's history were reviewed and updated as appropriate: allergies, current medications, past family history, past medical history, past social history, past surgical history and problem list. Problem list updated.  Objective:   Vitals:   11/22/15 1000  BP: (!) 145/75  Pulse: 68  Weight: 265 lb 14.4 oz (120.6 kg)    Fetal Status: Fetal Heart Rate (bpm): NST   Movement: Present     General:  Alert, oriented and cooperative. Patient is in no acute distress.  Skin: Skin is warm and dry. No rash noted.   Cardiovascular: Normal heart rate noted  Respiratory: Normal respiratory effort, no problems with respiration noted  Abdomen: Soft, gravid, appropriate for gestational age. Pain/Pressure: Present     Pelvic:  Cervical exam deferred        Extremities: Normal range of motion.     Mental Status: Normal mood and affect. Normal behavior. Normal judgment and thought content.   U/s 11/9 4 lb 2 oz 56%, vtx, fluid NST reviewed and reactive.  Assessment and Plan:  Pregnancy: G3P2002 at 6252w1d  1. Chronic hypertension in pregnancy Continue Labetalol and ASA BP up slightly and 1+ protein today--check labs--reports med complinace. - Fetal nonstress test - CBC - Comprehensive metabolic panel - Protein / creatinine ratio, urine - labetalol (NORMODYNE) 200 MG tablet; Take 1 tablet (200 mg total) by mouth 2 (two) times daily.  Dispense: 60 tablet; Refill:  3  2. Supervision of high risk pregnancy in third trimester Continue prenatal care.  Preterm labor symptoms and general obstetric precautions including but not limited to vaginal bleeding, contractions, leaking of fluid and fetal movement were reviewed in detail with the patient. Please refer to After Visit Summary for other counseling recommendations.  Return in about 4 days (around 11/26/2015) for NST/AFI; 11/28  Ob fu and NST/AFI.   Reva Boresanya S Ollis Daudelin, MD

## 2015-11-22 NOTE — Patient Instructions (Signed)
Third Trimester of Pregnancy The third trimester is from week 29 through week 40 (months 7 through 9). The third trimester is a time when the unborn baby (fetus) is growing rapidly. At the end of the ninth month, the fetus is about 20 inches in length and weighs 6-10 pounds. Body changes during your third trimester Your body goes through many changes during pregnancy. The changes vary from woman to woman. During the third trimester:  Your weight will continue to increase. You can expect to gain 25-35 pounds (11-16 kg) by the end of the pregnancy.  You may begin to get stretch marks on your hips, abdomen, and breasts.  You may urinate more often because the fetus is moving lower into your pelvis and pressing on your bladder.  You may develop or continue to have heartburn. This is caused by increased hormones that slow down muscles in the digestive tract.  You may develop or continue to have constipation because increased hormones slow digestion and cause the muscles that push waste through your intestines to relax.  You may develop hemorrhoids. These are swollen veins (varicose veins) in the rectum that can itch or be painful.  You may develop swollen, bulging veins (varicose veins) in your legs.  You may have increased body aches in the pelvis, back, or thighs. This is due to weight gain and increased hormones that are relaxing your joints.  You may have changes in your hair. These can include thickening of your hair, rapid growth, and changes in texture. Some women also have hair loss during or after pregnancy, or hair that feels dry or thin. Your hair will most likely return to normal after your baby is born.  Your breasts will continue to grow and they will continue to become tender. A yellow fluid (colostrum) may leak from your breasts. This is the first milk you are producing for your baby.  Your belly button may stick out.  You may notice more swelling in your hands, face, or  ankles.  You may have increased tingling or numbness in your hands, arms, and legs. The skin on your belly may also feel numb.  You may feel short of breath because of your expanding uterus.  You may have more problems sleeping. This can be caused by the size of your belly, increased need to urinate, and an increase in your body's metabolism.  You may notice the fetus "dropping," or moving lower in your abdomen.  You may have increased vaginal discharge.  Your cervix becomes thin and soft (effaced) near your due date. What to expect at prenatal visits You will have prenatal exams every 2 weeks until week 36. Then you will have weekly prenatal exams. During a routine prenatal visit:  You will be weighed to make sure you and the fetus are growing normally.  Your blood pressure will be taken.  Your abdomen will be measured to track your baby's growth.  The fetal heartbeat will be listened to.  Any test results from the previous visit will be discussed.  You may have a cervical check near your due date to see if you have effaced. At around 36 weeks, your health care provider will check your cervix. At the same time, your health care provider will also perform a test on the secretions of the vaginal tissue. This test is to determine if a type of bacteria, Group B streptococcus, is present. Your health care provider will explain this further. Your health care provider may ask you:    What your birth plan is.  How you are feeling.  If you are feeling the baby move.  If you have had any abnormal symptoms, such as leaking fluid, bleeding, severe headaches, or abdominal cramping.  If you are using any tobacco products, including cigarettes, chewing tobacco, and electronic cigarettes.  If you have any questions. Other tests or screenings that may be performed during your third trimester include:  Blood tests that check for low iron levels (anemia).  Fetal testing to check the health,  activity level, and growth of the fetus. Testing is done if you have certain medical conditions or if there are problems during the pregnancy.  Nonstress test (NST). This test checks the health of your baby to make sure there are no signs of problems, such as the baby not getting enough oxygen. During this test, a belt is placed around your belly. The baby is made to move, and its heart rate is monitored during movement. What is false labor? False labor is a condition in which you feel small, irregular tightenings of the muscles in the womb (contractions) that eventually go away. These are called Braxton Hicks contractions. Contractions may last for hours, days, or even weeks before true labor sets in. If contractions come at regular intervals, become more frequent, increase in intensity, or become painful, you should see your health care provider. What are the signs of labor?  Abdominal cramps.  Regular contractions that start at 10 minutes apart and become stronger and more frequent with time.  Contractions that start on the top of the uterus and spread down to the lower abdomen and back.  Increased pelvic pressure and dull back pain.  A watery or bloody mucus discharge that comes from the vagina.  Leaking of amniotic fluid. This is also known as your "water breaking." It could be a slow trickle or a gush. Let your doctor know if it has a color or strange odor. If you have any of these signs, call your health care provider right away, even if it is before your due date. Follow these instructions at home: Eating and drinking  Continue to eat regular, healthy meals.  Do not eat:  Raw meat or meat spreads.  Unpasteurized milk or cheese.  Unpasteurized juice.  Store-made salad.  Refrigerated smoked seafood.  Hot dogs or deli meat, unless they are piping hot.  More than 6 ounces of albacore tuna a week.  Shark, swordfish, king mackerel, or tile fish.  Store-made salads.  Raw  sprouts, such as mung bean or alfalfa sprouts.  Take prenatal vitamins as told by your health care provider.  Take 1000 mg of calcium daily as told by your health care provider.  If you develop constipation:  Take over-the-counter or prescription medicines.  Drink enough fluid to keep your urine clear or pale yellow.  Eat foods that are high in fiber, such as fresh fruits and vegetables, whole grains, and beans.  Limit foods that are high in fat and processed sugars, such as fried and sweet foods. Activity  Exercise only as directed by your health care provider. Healthy pregnant women should aim for 2 hours and 30 minutes of moderate exercise per week. If you experience any pain or discomfort while exercising, stop.  Avoid heavy lifting.  Do not exercise in extreme heat or humidity, or at high altitudes.  Wear low-heel, comfortable shoes.  Practice good posture.  Do not travel far distances unless it is absolutely necessary and only with the approval   of your health care provider.  Wear your seat belt at all times while in a car, on a bus, or on a plane.  Take frequent breaks and rest with your legs elevated if you have leg cramps or low back pain.  Do not use hot tubs, steam rooms, or saunas.  You may continue to have sex unless your health care provider tells you otherwise. Lifestyle  Do not use any products that contain nicotine or tobacco, such as cigarettes and e-cigarettes. If you need help quitting, ask your health care provider.  Do not drink alcohol.  Do not use any medicinal herbs or unprescribed drugs. These chemicals affect the formation and growth of the baby.  If you develop varicose veins:  Wear support pantyhose or compression stockings as told by your healthcare provider.  Elevate your feet for 15 minutes, 3-4 times a day.  Wear a supportive maternity bra to help with breast tenderness. General instructions  Take over-the-counter and prescription  medicines only as told by your health care provider. There are medicines that are either safe or unsafe to take during pregnancy.  Take warm sitz baths to soothe any pain or discomfort caused by hemorrhoids. Use hemorrhoid cream or witch hazel if your health care provider approves.  Avoid cat litter boxes and soil used by cats. These carry germs that can cause birth defects in the baby. If you have a cat, ask someone to clean the litter box for you.  To prepare for the arrival of your baby:  Take prenatal classes to understand, practice, and ask questions about the labor and delivery.  Make a trial run to the hospital.  Visit the hospital and tour the maternity area.  Arrange for maternity or paternity leave through employers.  Arrange for family and friends to take care of pets while you are in the hospital.  Purchase a rear-facing car seat and make sure you know how to install it in your car.  Pack your hospital bag.  Prepare the baby's nursery. Make sure to remove all pillows and stuffed animals from the baby's crib to prevent suffocation.  Visit your dentist if you have not gone during your pregnancy. Use a soft toothbrush to brush your teeth and be gentle when you floss.  Keep all prenatal follow-up visits as told by your health care provider. This is important. Contact a health care provider if:  You are unsure if you are in labor or if your water has broken.  You become dizzy.  You have mild pelvic cramps, pelvic pressure, or nagging pain in your abdominal area.  You have lower back pain.  You have persistent nausea, vomiting, or diarrhea.  You have an unusual or bad smelling vaginal discharge.  You have pain when you urinate. Get help right away if:  You have a fever.  You are leaking fluid from your vagina.  You have spotting or bleeding from your vagina.  You have severe abdominal pain or cramping.  You have rapid weight loss or weight gain.  You have  shortness of breath with chest pain.  You notice sudden or extreme swelling of your face, hands, ankles, feet, or legs.  Your baby makes fewer than 10 movements in 2 hours.  You have severe headaches that do not go away with medicine.  You have vision changes. Summary  The third trimester is from week 29 through week 40, months 7 through 9. The third trimester is a time when the unborn baby (fetus)   is growing rapidly.  During the third trimester, your discomfort may increase as you and your baby continue to gain weight. You may have abdominal, leg, and back pain, sleeping problems, and an increased need to urinate.  During the third trimester your breasts will keep growing and they will continue to become tender. A yellow fluid (colostrum) may leak from your breasts. This is the first milk you are producing for your baby.  False labor is a condition in which you feel small, irregular tightenings of the muscles in the womb (contractions) that eventually go away. These are called Braxton Hicks contractions. Contractions may last for hours, days, or even weeks before true labor sets in.  Signs of labor can include: abdominal cramps; regular contractions that start at 10 minutes apart and become stronger and more frequent with time; watery or bloody mucus discharge that comes from the vagina; increased pelvic pressure and dull back pain; and leaking of amniotic fluid. This information is not intended to replace advice given to you by your health care provider. Make sure you discuss any questions you have with your health care provider. Document Released: 12/16/2000 Document Revised: 05/30/2015 Document Reviewed: 02/23/2012 [employer] Interactive Patient Education  2017 [employer]   Breastfeeding Deciding to breastfeed is one of the best choices you can make for you and your baby. A change in hormones during pregnancy causes your breast tissue to grow and increases the number and size of your  milk ducts. These hormones also allow proteins, sugars, and fats from your blood supply to make breast milk in your milk-producing glands. Hormones prevent breast milk from being released before your baby is born as well as prompt milk flow after birth. Once breastfeeding has begun, thoughts of your baby, as well as his or her sucking or crying, can stimulate the release of milk from your milk-producing glands. Benefits of breastfeeding For Your Baby  Your first milk (colostrum) helps your baby's digestive system function better.  There are antibodies in your milk that help your baby fight off infections.  Your baby has a lower incidence of asthma, allergies, and sudden infant death syndrome.  The nutrients in breast milk are better for your baby than infant formulas and are designed uniquely for your baby's needs.  Breast milk improves your baby's brain development.  Your baby is less likely to develop other conditions, such as childhood obesity, asthma, or type 2 diabetes mellitus. For You  Breastfeeding helps to create a very special bond between you and your baby.  Breastfeeding is convenient. Breast milk is always available at the correct temperature and costs nothing.  Breastfeeding helps to burn calories and helps you lose the weight gained during pregnancy.  Breastfeeding makes your uterus contract to its prepregnancy size faster and slows bleeding (lochia) after you give birth.  Breastfeeding helps to lower your risk of developing type 2 diabetes mellitus, osteoporosis, and breast or ovarian cancer later in life. Signs that your baby is hungry Early Signs of Hunger  Increased alertness or activity.  Stretching.  Movement of the head from side to side.  Movement of the head and opening of the mouth when the corner of the mouth or cheek is stroked (rooting).  Increased sucking sounds, smacking lips, cooing, sighing, or squeaking.  Hand-to-mouth movements.  Increased  sucking of fingers or hands. Late Signs of Hunger  Fussing.  Intermittent crying. Extreme Signs of Hunger  Signs of extreme hunger will require calming and consoling before your baby will   be able to breastfeed successfully. Do not wait for the following signs of extreme hunger to occur before you initiate breastfeeding:  Restlessness.  A loud, strong cry.  Screaming. Breastfeeding basics  Breastfeeding Initiation  Find a comfortable place to sit or lie down, with your neck and back well supported.  Place a pillow or rolled up blanket under your baby to bring him or her to the level of your breast (if you are seated). Nursing pillows are specially designed to help support your arms and your baby while you breastfeed.  Make sure that your baby's abdomen is facing your abdomen.  Gently massage your breast. With your fingertips, massage from your chest wall toward your nipple in a circular motion. This encourages milk flow. You may need to continue this action during the feeding if your milk flows slowly.  Support your breast with 4 fingers underneath and your thumb above your nipple. Make sure your fingers are well away from your nipple and your baby's mouth.  Stroke your baby's lips gently with your finger or nipple.  When your baby's mouth is open wide enough, quickly bring your baby to your breast, placing your entire nipple and as much of the colored area around your nipple (areola) as possible into your baby's mouth.  More areola should be visible above your baby's upper lip than below the lower lip.  Your baby's tongue should be between his or her lower gum and your breast.  Ensure that your baby's mouth is correctly positioned around your nipple (latched). Your baby's lips should create a seal on your breast and be turned out (everted).  It is common for your baby to suck about 2-3 minutes in order to start the flow of breast milk. Latching  Teaching your baby how to latch  on to your breast properly is very important. An improper latch can cause nipple pain and decreased milk supply for you and poor weight gain in your baby. Also, if your baby is not latched onto your nipple properly, he or she may swallow some air during feeding. This can make your baby fussy. Burping your baby when you switch breasts during the feeding can help to get rid of the air. However, teaching your baby to latch on properly is still the best way to prevent fussiness from swallowing air while breastfeeding. Signs that your baby has successfully latched on to your nipple:  Silent tugging or silent sucking, without causing you pain.  Swallowing heard between every 3-4 sucks.  Muscle movement above and in front of his or her ears while sucking. Signs that your baby has not successfully latched on to nipple:  Sucking sounds or smacking sounds from your baby while breastfeeding.  Nipple pain. If you think your baby has not latched on correctly, slip your finger into the corner of your baby's mouth to break the suction and place it between your baby's gums. Attempt breastfeeding initiation again. Signs of Successful Breastfeeding  Signs from your baby:  A gradual decrease in the number of sucks or complete cessation of sucking.  Falling asleep.  Relaxation of his or her body.  Retention of a small amount of milk in his or her mouth.  Letting go of your breast by himself or herself. Signs from you:  Breasts that have increased in firmness, weight, and size 1-3 hours after feeding.  Breasts that are softer immediately after breastfeeding.  Increased milk volume, as well as a change in milk consistency and color by   the fifth day of breastfeeding.  Nipples that are not sore, cracked, or bleeding. Signs That Your Baby is Getting Enough Milk  Wetting at least 1-2 diapers during the first 24 hours after birth.  Wetting at least 5-6 diapers every 24 hours for the first week after  birth. The urine should be clear or pale yellow by 5 days after birth.  Wetting 6-8 diapers every 24 hours as your baby continues to grow and develop.  At least 3 stools in a 24-hour period by age 5 days. The stool should be soft and yellow.  At least 3 stools in a 24-hour period by age 7 days. The stool should be seedy and yellow.  No loss of weight greater than 10% of birth weight during the first 3 days of age.  Average weight gain of 4-7 ounces (113-198 g) per week after age 4 days.  Consistent daily weight gain by age 5 days, without weight loss after the age of 2 weeks. After a feeding, your baby may spit up a small amount. This is common. Breastfeeding frequency and duration Frequent feeding will help you make more milk and can prevent sore nipples and breast engorgement. Breastfeed when you feel the need to reduce the fullness of your breasts or when your baby shows signs of hunger. This is called "breastfeeding on demand." Avoid introducing a pacifier to your baby while you are working to establish breastfeeding (the first 4-6 weeks after your baby is born). After this time you may choose to use a pacifier. Research has shown that pacifier use during the first year of a baby's life decreases the risk of sudden infant death syndrome (SIDS). Allow your baby to feed on each breast as long as he or she wants. Breastfeed until your baby is finished feeding. When your baby unlatches or falls asleep while feeding from the first breast, offer the second breast. Because newborns are often sleepy in the first few weeks of life, you may need to awaken your baby to get him or her to feed. Breastfeeding times will vary from baby to baby. However, the following rules can serve as a guide to help you ensure that your baby is properly fed:  Newborns (babies 4 weeks of age or younger) may breastfeed every 1-3 hours.  Newborns should not go longer than 3 hours during the day or 5 hours during the night  without breastfeeding.  You should breastfeed your baby a minimum of 8 times in a 24-hour period until you begin to introduce solid foods to your baby at around 6 months of age. Breast milk pumping Pumping and storing breast milk allows you to ensure that your baby is exclusively fed your breast milk, even at times when you are unable to breastfeed. This is especially important if you are going back to work while you are still breastfeeding or when you are not able to be present during feedings. Your lactation consultant can give you guidelines on how long it is safe to store breast milk. A breast pump is a machine that allows you to pump milk from your breast into a sterile bottle. The pumped breast milk can then be stored in a refrigerator or freezer. Some breast pumps are operated by hand, while others use electricity. Ask your lactation consultant which type will work best for you. Breast pumps can be purchased, but some hospitals and breastfeeding support groups lease breast pumps on a monthly basis. A lactation consultant can teach you how to   hand express breast milk, if you prefer not to use a pump. Caring for your breasts while you breastfeed Nipples can become dry, cracked, and sore while breastfeeding. The following recommendations can help keep your breasts moisturized and healthy:  Avoid using soap on your nipples.  Wear a supportive bra. Although not required, special nursing bras and tank tops are designed to allow access to your breasts for breastfeeding without taking off your entire bra or top. Avoid wearing underwire-style bras or extremely tight bras.  Air dry your nipples for 3-4minutes after each feeding.  Use only cotton bra pads to absorb leaked breast milk. Leaking of breast milk between feedings is normal.  Use lanolin on your nipples after breastfeeding. Lanolin helps to maintain your skin's normal moisture barrier. If you use pure lanolin, you do not need to wash it off  before feeding your baby again. Pure lanolin is not toxic to your baby. You may also hand express a few drops of breast milk and gently massage that milk into your nipples and allow the milk to air dry. In the first few weeks after giving birth, some women experience extremely full breasts (engorgement). Engorgement can make your breasts feel heavy, warm, and tender to the touch. Engorgement peaks within 3-5 days after you give birth. The following recommendations can help ease engorgement:  Completely empty your breasts while breastfeeding or pumping. You may want to start by applying warm, moist heat (in the shower or with warm water-soaked hand towels) just before feeding or pumping. This increases circulation and helps the milk flow. If your baby does not completely empty your breasts while breastfeeding, pump any extra milk after he or she is finished.  Wear a snug bra (nursing or regular) or tank top for 1-2 days to signal your body to slightly decrease milk production.  Apply ice packs to your breasts, unless this is too uncomfortable for you.  Make sure that your baby is latched on and positioned properly while breastfeeding. If engorgement persists after 48 hours of following these recommendations, contact your health care provider or a lactation consultant. Overall health care recommendations while breastfeeding  Eat healthy foods. Alternate between meals and snacks, eating 3 of each per day. Because what you eat affects your breast milk, some of the foods may make your baby more irritable than usual. Avoid eating these foods if you are sure that they are negatively affecting your baby.  Drink milk, fruit juice, and water to satisfy your thirst (about 10 glasses a day).  Rest often, relax, and continue to take your prenatal vitamins to prevent fatigue, stress, and anemia.  Continue breast self-awareness checks.  Avoid chewing and smoking tobacco. Chemicals from cigarettes that pass  into breast milk and exposure to secondhand smoke may harm your baby.  Avoid alcohol and drug use, including marijuana. Some medicines that may be harmful to your baby can pass through breast milk. It is important to ask your health care provider before taking any medicine, including all over-the-counter and prescription medicine as well as vitamin and herbal supplements. It is possible to become pregnant while breastfeeding. If birth control is desired, ask your health care provider about options that will be safe for your baby. Contact a health care provider if:  You feel like you want to stop breastfeeding or have become frustrated with breastfeeding.  You have painful breasts or nipples.  Your nipples are cracked or bleeding.  Your breasts are red, tender, or warm.  You have   a swollen area on either breast.  You have a fever or chills.  You have nausea or vomiting.  You have drainage other than breast milk from your nipples.  Your breasts do not become full before feedings by the fifth day after you give birth.  You feel sad and depressed.  Your baby is too sleepy to eat well.  Your baby is having trouble sleeping.  Your baby is wetting less than 3 diapers in a 24-hour period.  Your baby has less than 3 stools in a 24-hour period.  Your baby's skin or the white part of his or her eyes becomes yellow.  Your baby is not gaining weight by 5 days of age. Get help right away if:  Your baby is overly tired (lethargic) and does not want to wake up and feed.  Your baby develops an unexplained fever. This information is not intended to replace advice given to you by your health care provider. Make sure you discuss any questions you have with your health care provider. Document Released: 12/22/2004 Document Revised: 06/05/2015 Document Reviewed: 06/15/2012 Elsevier Interactive Patient Education  2017 Elsevier Inc.  

## 2015-11-22 NOTE — Progress Notes (Signed)
US for growth scheduled 12/7.

## 2015-11-23 LAB — PROTEIN / CREATININE RATIO, URINE
CREATININE, URINE: 206 mg/dL (ref 20–320)
PROTEIN CREATININE RATIO: 146 mg/g{creat} (ref 21–161)
Total Protein, Urine: 30 mg/dL — ABNORMAL HIGH (ref 5–24)

## 2015-11-27 ENCOUNTER — Encounter: Payer: Self-pay | Admitting: *Deleted

## 2015-11-27 ENCOUNTER — Other Ambulatory Visit: Payer: Medicaid Other

## 2015-11-27 ENCOUNTER — Ambulatory Visit (INDEPENDENT_AMBULATORY_CARE_PROVIDER_SITE_OTHER): Payer: Medicaid Other | Admitting: Obstetrics and Gynecology

## 2015-11-27 ENCOUNTER — Ambulatory Visit: Payer: Self-pay

## 2015-11-27 VITALS — BP 149/80 | HR 83

## 2015-11-27 DIAGNOSIS — Z3689 Encounter for other specified antenatal screening: Secondary | ICD-10-CM

## 2015-11-27 DIAGNOSIS — O10919 Unspecified pre-existing hypertension complicating pregnancy, unspecified trimester: Secondary | ICD-10-CM

## 2015-11-27 DIAGNOSIS — O10913 Unspecified pre-existing hypertension complicating pregnancy, third trimester: Secondary | ICD-10-CM

## 2015-11-27 NOTE — Progress Notes (Signed)
11/22 NST reviewed and reactive 

## 2015-11-27 NOTE — Progress Notes (Addendum)
Pt denies H/A or visual disturbances. Pt had appt @ [facility] scheduled on 11/24 for NST however that dept is now unable to perform the test that Daris Aristizabal.  Dr. Jolayne Pantheronstant notified.

## 2015-11-27 NOTE — Addendum Note (Signed)
Addended by: Marchelle FolksAY, Elchonon Maxson L on: 11/27/2015 11:10 AM   Modules accepted: Orders

## 2015-11-29 ENCOUNTER — Ambulatory Visit (HOSPITAL_COMMUNITY): Admission: RE | Admit: 2015-11-29 | Payer: Medicaid Other | Source: Ambulatory Visit

## 2015-12-03 ENCOUNTER — Ambulatory Visit: Payer: Self-pay

## 2015-12-03 ENCOUNTER — Ambulatory Visit (INDEPENDENT_AMBULATORY_CARE_PROVIDER_SITE_OTHER): Payer: Medicaid Other | Admitting: Certified Nurse Midwife

## 2015-12-03 VITALS — BP 151/92 | HR 78 | Wt 261.5 lb

## 2015-12-03 DIAGNOSIS — O10913 Unspecified pre-existing hypertension complicating pregnancy, third trimester: Secondary | ICD-10-CM

## 2015-12-03 DIAGNOSIS — O0993 Supervision of high risk pregnancy, unspecified, third trimester: Secondary | ICD-10-CM

## 2015-12-03 DIAGNOSIS — O10919 Unspecified pre-existing hypertension complicating pregnancy, unspecified trimester: Secondary | ICD-10-CM

## 2015-12-03 LAB — POCT URINALYSIS DIP (DEVICE)
BILIRUBIN URINE: NEGATIVE
GLUCOSE, UA: NEGATIVE mg/dL
Nitrite: NEGATIVE
Protein, ur: NEGATIVE mg/dL
UROBILINOGEN UA: 0.2 mg/dL (ref 0.0–1.0)
pH: 6.5 (ref 5.0–8.0)

## 2015-12-03 NOTE — Progress Notes (Signed)
Subjective:  Carol Blair is a 25 y.o. G3P2002 at 7840w5d being seen today for ongoing prenatal care.  She is currently monitored for the following issues for this high-risk pregnancy and has Hypertension; ASCUS with positive high risk HPV; Galactorrhea; Supervision of high risk pregnancy, antepartum; and Chronic hypertension in pregnancy on her problem list.  Patient reports thin white vaginal discharge, some external irritation. Recently changed soaps. Denies curdy consistency and malodor..  Contractions: Irregular. Vag. Bleeding: None.  Movement: Present. Denies leaking of fluid. She denies HA, visual disturbances, and epigastric pain.  The following portions of the patient's history were reviewed and updated as appropriate: allergies, current medications, past family history, past medical history, past social history, past surgical history and problem list. Problem list updated.  Objective:   Vitals:   12/03/15 1402 12/03/15 1435  BP: (!) 150/86 (!) 151/92  Pulse: 78   Weight: 261 lb 8 oz (118.6 kg)     Fetal Status: Fetal Heart Rate (bpm): NST-R   Movement: Present  Presentation: Vertex  General:  Alert, oriented and cooperative. Patient is in no acute distress.  Skin: Skin is warm and dry. No rash noted.   Cardiovascular: Normal heart rate noted  Respiratory: Normal respiratory effort, no problems with respiration noted  Abdomen: Soft, gravid, appropriate for gestational age. Pain/Pressure: Present     Pelvic: Vag. Bleeding: None Vag D/C Character: White   Cervical exam deferred        Extremities: Normal range of motion.     Mental Status: Normal mood and affect. Normal behavior. Normal judgment and thought content.   Urinalysis:      Assessment and Plan:  Pregnancy: G3P2002 at 3540w5d  1. Supervision of high risk pregnancy in third trimester  2. Chronic hypertension in pregnancy - BP slightly elevated today, taking Labetalol consistently, asymptomatic; check home BPs next 2  days, call clinic if >150/90 - Fetal nonstress test - US MFM FETAL BPP WO NON STRESS; Future - US OB Limited; Future  3. Vaginal discharge - tub soaks with baking soda daily - discontinue soap in genital area, use warm water only  Preterm labor symptoms and general obstetric precautions including but not limited to vaginal bleeding, contractions, leaking of fluid and fetal movement were reviewed in detail with the patient. Please refer to After Visit Summary for other counseling recommendations.  Return in about 6 days (around 12/09/2015) for NST only;  12/11 or 12/12  Ob fu and NST.   Donette LarryMelanie Nykira Reddix, CNM

## 2015-12-03 NOTE — Progress Notes (Addendum)
Pt reports having some vaginal itching and white d/c.  US for growth and BPP scheduled 12/7.    Pt informed that the ultrasound is considered a limited OB ultrasound and is not intended to be a complete ultrasound exam.  Patient also informed that the ultrasound is not being completed with the intent of assessing for fetal or placental anomalies or any pelvic abnormalities.  Explained that the purpose of today's ultrasound is to assess for presentation and amniotic fluid volume.  Patient acknowledges the purpose of the exam and the limitations of the study.

## 2015-12-06 ENCOUNTER — Ambulatory Visit (INDEPENDENT_AMBULATORY_CARE_PROVIDER_SITE_OTHER): Payer: Medicaid Other | Admitting: Obstetrics & Gynecology

## 2015-12-06 VITALS — BP 150/79

## 2015-12-06 DIAGNOSIS — O10913 Unspecified pre-existing hypertension complicating pregnancy, third trimester: Secondary | ICD-10-CM | POA: Diagnosis present

## 2015-12-06 DIAGNOSIS — O10919 Unspecified pre-existing hypertension complicating pregnancy, unspecified trimester: Secondary | ICD-10-CM

## 2015-12-06 NOTE — Progress Notes (Signed)
US for growth and BPP scjheduled 12/7

## 2015-12-06 NOTE — Progress Notes (Signed)
NST reactive today.

## 2015-12-10 ENCOUNTER — Ambulatory Visit (INDEPENDENT_AMBULATORY_CARE_PROVIDER_SITE_OTHER): Payer: Medicaid Other | Admitting: *Deleted

## 2015-12-10 VITALS — BP 139/79 | HR 73

## 2015-12-10 DIAGNOSIS — O10913 Unspecified pre-existing hypertension complicating pregnancy, third trimester: Secondary | ICD-10-CM

## 2015-12-10 DIAGNOSIS — O10919 Unspecified pre-existing hypertension complicating pregnancy, unspecified trimester: Secondary | ICD-10-CM

## 2015-12-10 NOTE — Progress Notes (Signed)
NST performed today was reviewed and was found to be reactive.  Continue recommended antenatal testing and prenatal care.  

## 2015-12-10 NOTE — Progress Notes (Signed)
US for growth and BPP scheduled 12/7

## 2015-12-12 ENCOUNTER — Other Ambulatory Visit (HOSPITAL_COMMUNITY): Payer: Self-pay | Admitting: Maternal and Fetal Medicine

## 2015-12-12 ENCOUNTER — Encounter (HOSPITAL_COMMUNITY): Payer: Self-pay

## 2015-12-12 ENCOUNTER — Ambulatory Visit (HOSPITAL_COMMUNITY)
Admission: RE | Admit: 2015-12-12 | Discharge: 2015-12-12 | Disposition: A | Discharge status: Home / Self Care | Payer: Medicaid Other | Source: Ambulatory Visit | Attending: Family | Admitting: Family

## 2015-12-12 DIAGNOSIS — O10013 Pre-existing essential hypertension complicating pregnancy, third trimester: Secondary | ICD-10-CM | POA: Diagnosis present

## 2015-12-12 DIAGNOSIS — Z3A36 36 weeks gestation of pregnancy: Secondary | ICD-10-CM | POA: Diagnosis not present

## 2015-12-12 DIAGNOSIS — O10919 Unspecified pre-existing hypertension complicating pregnancy, unspecified trimester: Secondary | ICD-10-CM

## 2015-12-12 DIAGNOSIS — O99213 Obesity complicating pregnancy, third trimester: Secondary | ICD-10-CM | POA: Insufficient documentation

## 2015-12-16 NOTE — Addendum Note (Signed)
Encounter addended by: Donette LarryMelanie Seema Blum, CNM on: 12/16/2015 11:10 AM<BR>    Actions taken: Problem List modified

## 2015-12-17 ENCOUNTER — Ambulatory Visit (INDEPENDENT_AMBULATORY_CARE_PROVIDER_SITE_OTHER): Payer: Medicaid Other | Admitting: Family

## 2015-12-17 ENCOUNTER — Other Ambulatory Visit (HOSPITAL_COMMUNITY)
Admission: RE | Admit: 2015-12-17 | Discharge: 2015-12-17 | Disposition: A | Discharge status: Home / Self Care | Payer: Medicaid Other | Source: Ambulatory Visit | Attending: Family | Admitting: Family

## 2015-12-17 VITALS — BP 142/96 | HR 72 | Wt 261.5 lb

## 2015-12-17 DIAGNOSIS — Z113 Encounter for screening for infections with a predominantly sexual mode of transmission: Secondary | ICD-10-CM | POA: Diagnosis present

## 2015-12-17 DIAGNOSIS — O0990 Supervision of high risk pregnancy, unspecified, unspecified trimester: Secondary | ICD-10-CM

## 2015-12-17 DIAGNOSIS — O10919 Unspecified pre-existing hypertension complicating pregnancy, unspecified trimester: Secondary | ICD-10-CM

## 2015-12-17 DIAGNOSIS — O0993 Supervision of high risk pregnancy, unspecified, third trimester: Secondary | ICD-10-CM

## 2015-12-17 DIAGNOSIS — O10913 Unspecified pre-existing hypertension complicating pregnancy, third trimester: Secondary | ICD-10-CM

## 2015-12-17 LAB — POCT URINALYSIS DIP (DEVICE)
Bilirubin Urine: NEGATIVE
Glucose, UA: NEGATIVE mg/dL
HGB URINE DIPSTICK: NEGATIVE
Ketones, ur: NEGATIVE mg/dL
NITRITE: NEGATIVE
PH: 6 (ref 5.0–8.0)
PROTEIN: NEGATIVE mg/dL
Specific Gravity, Urine: 1.015 (ref 1.005–1.030)
UROBILINOGEN UA: 0.2 mg/dL (ref 0.0–1.0)

## 2015-12-17 LAB — OB RESULTS CONSOLE GBS: GBS: POSITIVE

## 2015-12-17 NOTE — Progress Notes (Signed)
   PRENATAL VISIT NOTE  Subjective:  Carol Blair is a 25 y.o. G3P2002 at 462w5d being seen today for ongoing prenatal care.  She is currently monitored for the following issues for this high-risk pregnancy and has Hypertension; ASCUS with positive high risk HPV; Galactorrhea; Supervision of high risk pregnancy, antepartum; and Chronic hypertension in pregnancy on her problem list.  Patient reports no complaints.  Denies  headache, vision changes, or epigastric pain.   Contractions: Irritability.  .  Movement: Present. Denies leaking of fluid.   The following portions of the patient's history were reviewed and updated as appropriate: allergies, current medications, past family history, past medical history, past social history, past surgical history and problem list. Problem list updated.  Objective:   Vitals:   12/17/15 1332  BP: (!) 142/96  Pulse: 72  Weight: 261 lb 8 oz (118.6 kg)    Fetal Status: Fetal Heart Rate (bpm): 136 Fundal Height: 37 cm Movement: Present  Presentation: Vertex  General:  Alert, oriented and cooperative. Patient is in no acute distress.  Skin: Skin is warm and dry. No rash noted.   Cardiovascular: Normal heart rate noted  Respiratory: Normal respiratory effort, no problems with respiration noted  Abdomen: Soft, gravid, appropriate for gestational age. Pain/Pressure: Present     Pelvic:  Cervical exam performed Dilation: 1.5 Effacement (%): Thick    Extremities: Normal range of motion.     Mental Status: Normal mood and affect. Normal behavior. Normal judgment and thought content.   Assessment and Plan:  Pregnancy: G3P2002 at 7362w5d  1. Chronic hypertension in pregnancy - Continue Labetalol - Return for NST in two days - Reviewed results of NST/BPP from 12/7 (BPP 8/8 and 56%ile weight) - Reviewed signs of preeclampsia  2. Supervision of high risk pregnancy in third trimester - Culture, beta strep (group b only) - [facility]lam[facility]  Preterm labor symptoms and general obstetric precautions including but not limited to vaginal bleeding, contractions, leaking of fluid and fetal movement were reviewed in detail with the patient. Please refer to After Visit Summary for other counseling recommendations.  Return in about 2 days (around 12/19/2015) for 12/14 or 12/15 NST/AFI;  12/19  Ob fu and NST; 12/22  NST/AFI.   Eino FarberWalidah Kennith GainN Karim, CNM

## 2015-12-17 NOTE — Progress Notes (Signed)
US for growth and BPP done 12/7. Pt does not need NST today per consult w/Walidah Elenora FenderKarim, CNM.

## 2015-12-18 LAB — GC/CHLAMYDIA PROBE AMP (~~LOC~~) NOT AT ARMC
CHLAMYDIA, DNA PROBE: NEGATIVE
NEISSERIA GONORRHEA: NEGATIVE

## 2015-12-19 LAB — CULTURE, BETA STREP (GROUP B ONLY)

## 2015-12-20 ENCOUNTER — Ambulatory Visit: Payer: Self-pay

## 2015-12-20 ENCOUNTER — Ambulatory Visit (INDEPENDENT_AMBULATORY_CARE_PROVIDER_SITE_OTHER): Payer: Medicaid Other | Admitting: Family Medicine

## 2015-12-20 VITALS — BP 148/79 | HR 73

## 2015-12-20 DIAGNOSIS — Z3689 Encounter for other specified antenatal screening: Secondary | ICD-10-CM | POA: Diagnosis not present

## 2015-12-20 DIAGNOSIS — O10919 Unspecified pre-existing hypertension complicating pregnancy, unspecified trimester: Secondary | ICD-10-CM

## 2015-12-20 DIAGNOSIS — O10913 Unspecified pre-existing hypertension complicating pregnancy, third trimester: Secondary | ICD-10-CM | POA: Diagnosis present

## 2015-12-20 NOTE — Progress Notes (Signed)
Pt informed that the ultrasound is considered a limited OB ultrasound and is not intended to be a complete ultrasound exam.  Patient also informed that the ultrasound is not being completed with the intent of assessing for fetal or placental anomalies or any pelvic abnormalities.  Explained that the purpose of today's ultrasound is to assess for fetal presentation and amniotic fluid volume  Patient acknowledges the purpose of the exam and the limitations of the study.

## 2015-12-20 NOTE — Progress Notes (Signed)
NST reviewed and reactive. Images reviewed--infant is vertex, AFI is 13.1.

## 2015-12-24 ENCOUNTER — Ambulatory Visit (INDEPENDENT_AMBULATORY_CARE_PROVIDER_SITE_OTHER): Payer: Medicaid Other | Admitting: Student

## 2015-12-24 ENCOUNTER — Encounter (HOSPITAL_COMMUNITY): Payer: Self-pay | Admitting: *Deleted

## 2015-12-24 ENCOUNTER — Telehealth (HOSPITAL_COMMUNITY): Payer: Self-pay | Admitting: *Deleted

## 2015-12-24 VITALS — BP 149/91 | HR 77 | Wt 262.7 lb

## 2015-12-24 DIAGNOSIS — O10913 Unspecified pre-existing hypertension complicating pregnancy, third trimester: Secondary | ICD-10-CM | POA: Diagnosis not present

## 2015-12-24 DIAGNOSIS — O10919 Unspecified pre-existing hypertension complicating pregnancy, unspecified trimester: Secondary | ICD-10-CM

## 2015-12-24 DIAGNOSIS — O0993 Supervision of high risk pregnancy, unspecified, third trimester: Secondary | ICD-10-CM

## 2015-12-24 LAB — POCT URINALYSIS DIP (DEVICE)
GLUCOSE, UA: NEGATIVE mg/dL
Hgb urine dipstick: NEGATIVE
KETONES UR: NEGATIVE mg/dL
NITRITE: NEGATIVE
PROTEIN: 30 mg/dL — AB
Specific Gravity, Urine: 1.02 (ref 1.005–1.030)
Urobilinogen, UA: 2 mg/dL — ABNORMAL HIGH (ref 0.0–1.0)
pH: 6.5 (ref 5.0–8.0)

## 2015-12-24 NOTE — Telephone Encounter (Signed)
Preadmission screen  

## 2015-12-24 NOTE — Patient Instructions (Signed)
Hypertension During Pregnancy °Hypertension, commonly called high blood pressure, is when the force of blood pumping through your arteries is too strong. Arteries are blood vessels that carry blood from the heart throughout the body. Hypertension during pregnancy can cause problems for you and your baby. Your baby may be born early (prematurely) or may not weigh as much as he or she should at birth. Very bad cases of hypertension during pregnancy can be life-threatening. °Different types of hypertension can occur during pregnancy. These include: °· Chronic hypertension. This happens when: °¨ You have hypertension before pregnancy and it continues during pregnancy. °¨ You develop hypertension before you are [redacted] weeks pregnant, and it continues during pregnancy. °· Gestational hypertension. This is hypertension that develops after the 20th week of pregnancy. °· Preeclampsia, also called toxemia of pregnancy. This is a very serious type of hypertension that develops only during pregnancy. It affects the whole body, and it can be very dangerous for you and your baby. °Gestational hypertension and preeclampsia usually go away within 6 weeks after your baby is born. Women who have hypertension during pregnancy have a greater chance of developing hypertension later in life or during future pregnancies. °What are the causes? °The exact cause of hypertension is not known. °What increases the risk? °There are certain factors that make it more likely for you to develop hypertension during pregnancy. These include: °· Having hypertension during a previous pregnancy or prior to pregnancy. °· Being overweight. °· Being older than age 40. °· Being pregnant for the first time or being pregnant with more than one baby. °· Becoming pregnant using fertilization methods such as IVF (in vitro fertilization). °· Having diabetes, kidney problems, or systemic lupus erythematosus. °· Having a family history of hypertension. °What are the  signs or symptoms? °Chronic hypertension and gestational hypertension rarely cause symptoms. Preeclampsia causes symptoms, which may include: °· Increased protein in your urine. Your health care provider will check for this at every visit before you give birth (prenatal visit). °· Severe headaches. °· Sudden weight gain. °· Swelling of the hands, face, legs, and feet. °· Nausea and vomiting. °· Vision problems, such as blurred or double vision. °· Numbness in the face, arms, legs, and feet. °· Dizziness. °· Slurred speech. °· Sensitivity to bright lights. °· Abdominal pain. °· Convulsions. °How is this diagnosed? °You may be diagnosed with hypertension during a routine prenatal exam. At each prenatal visit, you may: °· Have a urine test to check for high amounts of protein in your urine. °· Have your blood pressure checked. A blood pressure reading is recorded as two numbers, such as "120 over 80" (or 120/80). The first ("top") number is called the systolic pressure. It is a measure of the pressure in your arteries when your heart beats. The second ("bottom") number is called the diastolic pressure. It is a measure of the pressure in your arteries as your heart relaxes between beats. Blood pressure is measured in a unit called mm Hg. A normal blood pressure reading is: °¨ Systolic: below 120. °¨ Diastolic: below 80. °The type of hypertension that you are diagnosed with depends on your test results and when your symptoms developed. °· Chronic hypertension is usually diagnosed before 20 weeks of pregnancy. °· Gestational hypertension is usually diagnosed after 20 weeks of pregnancy. °· Hypertension with high amounts of protein in the urine is diagnosed as preeclampsia. °· Blood pressure measurements that stay above 160 systolic, or above 110 diastolic, are signs of severe preeclampsia. °  How is this treated? °Treatment for hypertension during pregnancy varies depending on the type of hypertension you have and how  serious it is. °· If you take medicines called ACE inhibitors to treat chronic hypertension, you may need to switch medicines. ACE inhibitors should not be taken during pregnancy. °· If you have gestational hypertension, you may need to take blood pressure medicine. °· If you are at risk for preeclampsia, your health care provider may recommend that you take a low-dose aspirin every day to prevent high blood pressure during your pregnancy. °· If you have severe preeclampsia, you may need to be hospitalized so you and your baby can be monitored closely. You may also need to take medicine (magnesium sulfate) to prevent seizures and to lower blood pressure. This medicine may be given as an injection or through an IV tube. °· In some cases, if your condition gets worse, you may need to deliver your baby early. °Follow these instructions at home: °Eating and drinking °· Drink enough fluid to keep your urine clear or pale yellow. °· Eat a healthy diet that is low in salt (sodium). Do not add salt to your food. Check food labels to see how much sodium a food or beverage contains. °Lifestyle °· Do not use any products that contain nicotine or tobacco, such as cigarettes and e-cigarettes. If you need help quitting, ask your health care provider. °· Do not use alcohol. °· Avoid caffeine. °· Avoid stress as much as possible. Rest and get plenty of sleep. °General instructions °· Take over-the-counter and prescription medicines only as told by your health care provider. °· While lying down, lie on your left side. This keeps pressure off your baby. °· While sitting or lying down, raise (elevate) your feet. Try putting some pillows under your lower legs. °· Exercise regularly. Ask your health care provider what kinds of exercise are best for you. °· Keep all prenatal and follow-up visits as told by your health care provider. This is important. °Contact a health care provider if: °· You have symptoms that your health care provider  told you may require more treatment or monitoring, such as: °¨ Fever. °¨ Vomiting. °¨ Headache. °Get help right away if: °· You have severe abdominal pain or vomiting that does not get better with treatment. °· You suddenly develop swelling in your hands, ankles, or face. °· You gain 4 lbs (1.8 kg) or more in 1 week. °· You develop vaginal bleeding, or you have blood in your urine. °· You do not feel your baby moving as much as usual. °· You have blurred or double vision. °· You have muscle twitching or sudden tightening (spasms). °· You have shortness of breath. °· Your lips or fingernails turn blue. °This information is not intended to replace advice given to you by your health care provider. Make sure you discuss any questions you have with your health care provider. °Document Released: 09/09/2010 Document Revised: 07/12/2015 Document Reviewed: 06/07/2015 °Elsevier Interactive Patient Education © 2017 Elsevier Inc. ° °

## 2015-12-24 NOTE — Progress Notes (Signed)
Pt denies H/A or visual disturbances. IOL scheduled 12/28 @ midnight

## 2015-12-24 NOTE — Progress Notes (Signed)
   PRENATAL VISIT NOTE  Subjective:  Carol Blair is a 25 y.o. G3P2002 at 2272w5d being seen today for ongoing prenatal care.  She is currently monitored for the following issues for this high-risk pregnancy and has Hypertension; ASCUS with positive high risk HPV; Galactorrhea; Supervision of high risk pregnancy, antepartum; and Chronic hypertension in pregnancy on her problem list.  Patient reports no complaints.  Contractions: Irregular. Vag. Bleeding: None.  Movement: Present. Denies leaking of fluid.   The following portions of the patient's history were reviewed and updated as appropriate: allergies, current medications, past family history, past medical history, past social history, past surgical history and problem list. Problem list updated.  Objective:   Vitals:   12/24/15 1442 12/24/15 1526  BP: (!) 150/80 (!) 149/91  Pulse: 77   Weight: 262 lb 11.2 oz (119.2 kg)     Fetal Status: Fetal Heart Rate (bpm): NST   Movement: Present     General:  Alert, oriented and cooperative. Patient is in no acute distress.  Skin: Skin is warm and dry. No rash noted.   Cardiovascular: Normal heart rate noted  Respiratory: Normal respiratory effort, no problems with respiration noted  Abdomen: Soft, gravid, appropriate for gestational age. Pain/Pressure: Present     Pelvic:  Cervical exam deferred        Extremities: Normal range of motion.     Mental Status: Normal mood and affect. Normal behavior. Normal judgment and thought content.   Assessment and Plan:  Pregnancy: G3P2002 at 6072w5d  1. Chronic hypertension in pregnancy  - Fetal nonstress test -- reactive -Pt continues to take labetalol 200 mg BID -denies headache, vision changes, epigastric pain  2. Supervision of high risk pregnancy in third trimester   Term labor symptoms and general obstetric precautions including but not limited to vaginal bleeding, contractions, leaking of fluid and fetal movement were reviewed in detail  with the patient. Please refer to After Visit Summary for other counseling recommendations.  Return in about 7 days (around 12/31/2015) for Ob fu and NST.   Judeth HornErin Yuvan Medinger, NP

## 2015-12-27 ENCOUNTER — Ambulatory Visit (INDEPENDENT_AMBULATORY_CARE_PROVIDER_SITE_OTHER): Payer: Medicaid Other | Admitting: Obstetrics & Gynecology

## 2015-12-27 ENCOUNTER — Ambulatory Visit: Payer: Self-pay

## 2015-12-27 VITALS — BP 154/79 | HR 75

## 2015-12-27 DIAGNOSIS — O10913 Unspecified pre-existing hypertension complicating pregnancy, third trimester: Secondary | ICD-10-CM

## 2015-12-27 DIAGNOSIS — Z3689 Encounter for other specified antenatal screening: Secondary | ICD-10-CM | POA: Diagnosis not present

## 2015-12-27 DIAGNOSIS — O10919 Unspecified pre-existing hypertension complicating pregnancy, unspecified trimester: Secondary | ICD-10-CM

## 2015-12-27 NOTE — Progress Notes (Signed)
Pt denies H/A or visual disturbances.  States she has not taken her Labetalol yet today. IOL scheduled 12/28.

## 2015-12-28 ENCOUNTER — Encounter: Payer: Self-pay | Admitting: Obstetrics & Gynecology

## 2015-12-28 ENCOUNTER — Encounter (HOSPITAL_COMMUNITY): Payer: Self-pay | Admitting: *Deleted

## 2015-12-28 ENCOUNTER — Inpatient Hospital Stay (HOSPITAL_COMMUNITY)
Admission: AD | Admit: 2015-12-28 | Discharge: 2015-12-31 | DRG: 774 | Disposition: A | Discharge status: Home / Self Care | Payer: Medicaid Other | Source: Ambulatory Visit | Attending: Obstetrics & Gynecology | Admitting: Obstetrics & Gynecology

## 2015-12-28 DIAGNOSIS — O99824 Streptococcus B carrier state complicating childbirth: Secondary | ICD-10-CM | POA: Diagnosis not present

## 2015-12-28 DIAGNOSIS — O114 Pre-existing hypertension with pre-eclampsia, complicating childbirth: Principal | ICD-10-CM | POA: Diagnosis present

## 2015-12-28 DIAGNOSIS — O1002 Pre-existing essential hypertension complicating childbirth: Secondary | ICD-10-CM | POA: Diagnosis present

## 2015-12-28 DIAGNOSIS — Z8249 Family history of ischemic heart disease and other diseases of the circulatory system: Secondary | ICD-10-CM | POA: Diagnosis not present

## 2015-12-28 DIAGNOSIS — Z7982 Long term (current) use of aspirin: Secondary | ICD-10-CM

## 2015-12-28 DIAGNOSIS — O1413 Severe pre-eclampsia, third trimester: Secondary | ICD-10-CM | POA: Diagnosis present

## 2015-12-28 DIAGNOSIS — Z22330 Carrier of Group B streptococcus: Secondary | ICD-10-CM

## 2015-12-28 DIAGNOSIS — Z3A38 38 weeks gestation of pregnancy: Secondary | ICD-10-CM | POA: Diagnosis not present

## 2015-12-28 DIAGNOSIS — O113 Pre-existing hypertension with pre-eclampsia, third trimester: Secondary | ICD-10-CM | POA: Diagnosis not present

## 2015-12-28 LAB — URINALYSIS, ROUTINE W REFLEX MICROSCOPIC
Bilirubin Urine: NEGATIVE
GLUCOSE, UA: NEGATIVE mg/dL
HGB URINE DIPSTICK: NEGATIVE
Ketones, ur: NEGATIVE mg/dL
LEUKOCYTES UA: NEGATIVE
NITRITE: NEGATIVE
PH: 5.5 (ref 5.0–8.0)
Protein, ur: NEGATIVE mg/dL
Specific Gravity, Urine: <1.005 — ABNORMAL LOW (ref 1.005–1.030)

## 2015-12-28 LAB — CBC
HCT: 33.4 % — ABNORMAL LOW (ref 36.0–46.0)
HEMOGLOBIN: 11.5 g/dL — AB (ref 12.0–15.0)
MCH: 31.4 pg (ref 26.0–34.0)
MCHC: 34.4 g/dL (ref 30.0–36.0)
MCV: 91.3 fL (ref 78.0–100.0)
PLATELETS: 200 10*3/uL (ref 150–400)
RBC: 3.66 MIL/uL — AB (ref 3.87–5.11)
RDW: 13.8 % (ref 11.5–15.5)
WBC: 8.5 10*3/uL (ref 4.0–10.5)

## 2015-12-28 LAB — COMPREHENSIVE METABOLIC PANEL
ALK PHOS: 183 U/L — AB (ref 38–126)
ALT: 22 U/L (ref 14–54)
AST: 20 U/L (ref 15–41)
Albumin: 3 g/dL — ABNORMAL LOW (ref 3.5–5.0)
Anion gap: 8 (ref 5–15)
BUN: 5 mg/dL — AB (ref 6–20)
CHLORIDE: 104 mmol/L (ref 101–111)
CO2: 21 mmol/L — AB (ref 22–32)
CREATININE: 0.62 mg/dL (ref 0.44–1.00)
Calcium: 9.3 mg/dL (ref 8.9–10.3)
GFR calc Af Amer: >60 mL/min (ref >60)
Glucose, Bld: 85 mg/dL (ref 65–99)
Potassium: 3.7 mmol/L (ref 3.5–5.1)
Sodium: 133 mmol/L — ABNORMAL LOW (ref 135–145)
Total Bilirubin: 0.5 mg/dL (ref 0.3–1.2)
Total Protein: 7.1 g/dL (ref 6.5–8.1)

## 2015-12-28 LAB — PROTEIN / CREATININE RATIO, URINE
CREATININE, URINE: 40 mg/dL
Protein Creatinine Ratio: 0.15 mg/mg{Cre} (ref 0.00–0.15)
Total Protein, Urine: 6 mg/dL

## 2015-12-28 MED ORDER — LACTATED RINGERS IV SOLN
INTRAVENOUS | Status: DC
Start: 2015-12-28 — End: 2015-12-29
  Administered 2015-12-28: 18:00:00 via INTRAVENOUS

## 2015-12-28 MED ORDER — FENTANYL CITRATE (PF) 100 MCG/2ML IJ SOLN
100.0000 ug | INTRAMUSCULAR | Status: DC | PRN
  Administered 2015-12-29: 100 ug via INTRAVENOUS
  Filled 2015-12-28: qty 2

## 2015-12-28 MED ORDER — PENICILLIN G POT IN DEXTROSE 60000 UNIT/ML IV SOLN
3.0000 10*6.[IU] | INTRAVENOUS | Status: DC
  Administered 2015-12-29: 3 10*6.[IU] via INTRAVENOUS
  Filled 2015-12-28 (×4): qty 50

## 2015-12-28 MED ORDER — OXYTOCIN 40 UNITS IN LACTATED RINGERS INFUSION - SIMPLE MED
1.0000 m[IU]/min | INTRAVENOUS | Status: DC
  Administered 2015-12-28: 2 m[IU]/min via INTRAVENOUS
  Filled 2015-12-28: qty 1000

## 2015-12-28 MED ORDER — SOD CITRATE-CITRIC ACID 500-334 MG/5ML PO SOLN: 30.0000 mL | ORAL | Status: DC | PRN

## 2015-12-28 MED ORDER — MAGNESIUM SULFATE 50 % IJ SOLN
2.0000 g/h | INTRAVENOUS | Status: DC
  Administered 2015-12-28: 2 g/h via INTRAVENOUS
  Filled 2015-12-28: qty 80

## 2015-12-28 MED ORDER — TERBUTALINE SULFATE 1 MG/ML IJ SOLN: 0.2500 mg | Freq: Once | INTRAMUSCULAR | Status: DC | PRN

## 2015-12-28 MED ORDER — LACTATED RINGERS IV SOLN: 500.0000 mL | INTRAVENOUS | Status: DC | PRN

## 2015-12-28 MED ORDER — OXYTOCIN 40 UNITS IN LACTATED RINGERS INFUSION - SIMPLE MED: 2.5000 [IU]/h | INTRAVENOUS | Status: DC

## 2015-12-28 MED ORDER — HYDRALAZINE HCL 20 MG/ML IJ SOLN: 10.0000 mg | Freq: Once | INTRAMUSCULAR | Status: DC | PRN

## 2015-12-28 MED ORDER — LIDOCAINE HCL (PF) 1 % IJ SOLN
30.0000 mL | INTRAMUSCULAR | Status: AC | PRN
  Administered 2015-12-29: 30 mL via SUBCUTANEOUS
  Filled 2015-12-28: qty 30

## 2015-12-28 MED ORDER — ONDANSETRON HCL 4 MG/2ML IJ SOLN: 4.0000 mg | Freq: Four times a day (QID) | INTRAMUSCULAR | Status: DC | PRN

## 2015-12-28 MED ORDER — LACTATED RINGERS IV SOLN
INTRAVENOUS | Status: DC
  Administered 2015-12-28: 20:00:00 via INTRAVENOUS

## 2015-12-28 MED ORDER — OXYTOCIN BOLUS FROM INFUSION
500.0000 mL | Freq: Once | INTRAVENOUS | Status: AC
  Administered 2015-12-29: 500 mL via INTRAVENOUS

## 2015-12-28 MED ORDER — PENICILLIN G POTASSIUM 5000000 UNITS IJ SOLR
5.0000 10*6.[IU] | Freq: Once | INTRAVENOUS | Status: AC
  Administered 2015-12-28: 5 10*6.[IU] via INTRAVENOUS
  Filled 2015-12-28: qty 5

## 2015-12-28 MED ORDER — MAGNESIUM SULFATE BOLUS VIA INFUSION
4.0000 g | Freq: Once | INTRAVENOUS | Status: AC
  Administered 2015-12-28: 4 g via INTRAVENOUS
  Filled 2015-12-28: qty 500

## 2015-12-28 MED ORDER — LABETALOL HCL 5 MG/ML IV SOLN
20.0000 mg | INTRAVENOUS | Status: DC | PRN
  Administered 2015-12-28: 20 mg via INTRAVENOUS
  Filled 2015-12-28: qty 4

## 2015-12-28 MED ORDER — ACETAMINOPHEN 325 MG PO TABS: 650.0000 mg | ORAL_TABLET | ORAL | Status: DC | PRN

## 2015-12-28 NOTE — [facility]U Provider Note (Signed)
History     CSN: [id number]  Arrival date and time: 12/28/15 1656   First Provider Initiated Contact with Patient 12/28/15 1727      No chief complaint on file.  HPI  [name] is a 25 y.o. female [phone] @ 60w2dwith a history of chronic hypertension here in [facility] with HA. She took her BP at home at it read 169/110, she rechecked and it was higher so she came in. She currently rates her HA pain 6-7. She has not taken anything tylenol.    OB History    Gravida Para Term Preterm AB Living   '3 2 2     2   ' SAB TAB Ectopic Multiple Live Births           2      Past Medical History:  Diagnosis Date  . Benign essential hypertension antepartum 02/11/2011  . Hypertension   . Obesity   . Pregnancy induced hypertension     Past Surgical History:  Procedure Laterality Date  . LAPAROSCOPIC CHOLECYSTECTOMY W/ CHOLANGIOGRAPHY  06/10/10   Dr EGreer Pickerel   Family History  Problem Relation Age of Onset  . Hypertension Sister   . Lupus Sister   . Cancer Maternal Grandmother     GI  . Cancer Maternal Aunt     Breast  . Cancer Maternal Uncle     colon    Social History  Substance Use Topics  . Smoking status: Never Smoker  . Smokeless tobacco: Never Used  . Alcohol use No    Allergies:  Allergies  Allergen Reactions  . Codeine Nausea And Vomiting    Shaking, sweating    Prescriptions Prior to Admission  Medication Sig Dispense Refill Last Dose  . acetaminophen (TYLENOL) 500 MG tablet Take 1,000 mg by mouth every 6 (six) hours as needed for headache.   Taking  . aspirin EC 81 MG tablet Take 1 tablet (81 mg total) by mouth daily. 30 tablet 5 Taking  . cetirizine (ZYRTEC) 10 MG tablet Take 10 mg by mouth daily as needed for allergies. Reported on 07/03/2015   Not Taking  . labetalol (NORMODYNE) 200 MG tablet Take 1 tablet (200 mg total) by mouth 2 (two) times daily. 60 tablet 3 Taking  . Prenatal Multivit-Min-Fe-FA (PRENATAL VITAMINS) 0.8 MG tablet Take 1 tablet  by mouth daily. 30 tablet 11 Taking   Results for orders placed or performed during the hospital encounter of 12/28/15 (from the past 48 hour(s))  Urinalysis, Routine w reflex microscopic     Status: Abnormal   Collection Time: 12/28/15  5:00 PM  Result Value Ref Range   Color, Urine YELLOW YELLOW   APPearance CLEAR CLEAR   Specific Gravity, Urine <1.005 (L) 1.005 - 1.030   pH 5.5 5.0 - 8.0   Glucose, UA NEGATIVE NEGATIVE mg/dL   Hgb urine dipstick NEGATIVE NEGATIVE   Bilirubin Urine NEGATIVE NEGATIVE   Ketones, ur NEGATIVE NEGATIVE mg/dL   Protein, ur NEGATIVE NEGATIVE mg/dL   Nitrite NEGATIVE NEGATIVE   Leukocytes, UA NEGATIVE NEGATIVE    Comment: Microscopic not done on urines with negative protein, blood, leukocytes, nitrite, or glucose < 500 mg/dL.  Protein / creatinine ratio, urine     Status: None   Collection Time: 12/28/15  5:00 PM  Result Value Ref Range   Creatinine, Urine 40.00 mg/dL   Total Protein, Urine 6 mg/dL    Comment: NO NORMAL RANGE ESTABLISHED FOR THIS TEST   Protein  Creatinine Ratio 0.15 0.00 - 0.15 mg/mg[Cre]  CBC     Status: Abnormal   Collection Time: 12/28/15  5:45 PM  Result Value Ref Range   WBC 8.5 4.0 - 10.5 K/uL   RBC 3.66 (L) 3.87 - 5.11 MIL/uL   Hemoglobin 11.5 (L) 12.0 - 15.0 g/dL   HCT 33.4 (L) 36.0 - 46.0 %   MCV 91.3 78.0 - 100.0 fL   MCH 31.4 26.0 - 34.0 pg   MCHC 34.4 30.0 - 36.0 g/dL   RDW 13.8 11.5 - 15.5 %   Platelets 200 150 - 400 K/uL  Comprehensive metabolic panel     Status: Abnormal (Preliminary result)   Collection Time: 12/28/15  5:45 PM  Result Value Ref Range   Sodium 133 (L) 135 - 145 mmol/L   Potassium 3.7 3.5 - 5.1 mmol/L   Chloride 104 101 - 111 mmol/L   CO2 21 (L) 22 - 32 mmol/L   Glucose, Bld 85 65 - 99 mg/dL   BUN PENDING 6 - 20 mg/dL   Creatinine, Ser 0.62 0.44 - 1.00 mg/dL   Calcium 9.3 8.9 - 10.3 mg/dL   Total Protein 7.1 6.5 - 8.1 g/dL   Albumin 3.0 (L) 3.5 - 5.0 g/dL   AST 20 15 - 41 U/L   ALT 22 14 -  54 U/L   Alkaline Phosphatase 183 (H) 38 - 126 U/L   Total Bilirubin 0.5 0.3 - 1.2 mg/dL   GFR calc non Af Amer >60 >60 mL/min   GFR calc Af Amer >60 >60 mL/min    Comment: (NOTE) The eGFR has been calculated using the CKD EPI equation. This calculation has not been validated in all clinical situations. eGFR's persistently <60 mL/min signify possible Chronic Kidney Disease.    Anion gap 8 5 - 15    Review of Systems  Eyes: Negative for blurred vision and double vision.  Gastrointestinal: Negative for abdominal pain.  Neurological: Positive for headaches.   Physical Exam   Blood pressure 160/96, pulse 85, temperature 98.2 F (36.8 C), resp. rate 18, height '5\' 5"'  (1.651 m), weight 264 lb (119.7 kg), last menstrual period 03/29/2015, unknown if currently breastfeeding.  Patient Vitals for the past 24 hrs:  BP Temp Pulse Resp Height Weight  12/28/15 1847 139/89 - 70 - - -  12/28/15 1832 139/94 - 76 - - -  12/28/15 1817 158/94 - 67 - - -  12/28/15 1804 152/86 - 70 - - -  12/28/15 1747 151/92 - 75 - - -  12/28/15 1732 168/97 - 68 - - -  12/28/15 1716 160/96 - 85 - - -  12/28/15 1712 154/94 98.2 F (36.8 C) 82 18 - -  12/28/15 1705 - - - - '5\' 5"'  (1.651 m) 264 lb (119.7 kg)    Physical Exam  Constitutional: She is oriented to person, place, and time. She appears well-developed and well-nourished. No distress.  HENT:  Head: Normocephalic.  Respiratory: Effort normal.  GI: Soft. She exhibits no distension and no mass. There is no tenderness. There is no rebound and no guarding.  Musculoskeletal: Normal range of motion.  Neurological: She is alert and oriented to person, place, and time. She has normal reflexes. She displays normal reflexes.  Clonus negative   Skin: Skin is warm. She is not diaphoretic.  Psychiatric: Her behavior is normal.    [facility]U Course  Procedures  None  MDM  -PIH labs pending -Labetalol protocol initiated  -UA -Protein creatine ratio  -  tylenol 1  gram given PO; HA improved and gone -Discussed labs and HPI with Dr. Harolyn Rutherford, will admit to [facility].   Assessment and Plan   A:  1. 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

## 2015-12-28 NOTE — Progress Notes (Signed)
Labor Progress Note Mickey FarberKimberly N Natt is a 25 y.o. G3P2002 at 7873w2d presented for IOL for Preeclampsia  S:  Comfortable. HA improved. Denies visual disturbances and epigastric pain.   O:  BP (!) 152/85 (BP Location: Right Arm)   Pulse 70   Temp 98.1 F (36.7 C) (Oral)   Resp 20   Ht 5\' 5"  (1.651 m)   Wt 119.7 kg (264 lb)   LMP 03/29/2015   SpO2 97%   BMI 43.93 kg/m  EFM: baseline 135 bpm/ mod variability/ + accels/ no decels  Toco: irregular, mild SVE:  3/70/-2, vtx  A/P: 25 y.o. G3P2002 2173w2d  1. Labor: IOL for Chronic HTN with SI Pre-e 2. FWB: Cat I 3. Pain: analgesia/anesthesia prn  Continue Magnesium Sulfate for seizure prophylaxis. Labetalol IV prn. Cervix favorable will start Pitocin. Anticipate SVD.  Donette LarryMelanie Chameka Mcmullen, CNM 8:50 PM

## 2015-12-28 NOTE — H&P (Signed)
CSN: [id number]  Arrival date and time: 12/28/15 1656   First Provider Initiated Contact with Patient 12/28/15 1727      No chief complaint on file.  HPI  [name] is a 25 y.o. female [phone] @ 34w2dwith a history of chronic hypertension here in [facility]U with HA. She took her BP at home at it read 169/110, she rechecked and it was higher so she came in. She currently rates her HA pain 6-7. She has not taken anything tylenol.            OB History    Gravida Para Term Preterm AB Living   _0 SAB TAB Ectopic Multiple Live Births           2          Past Medical History:  Diagnosis Date  . Benign essential hypertension antepartum 02/11/2011  . Hypertension   . Obesity   . Pregnancy induced hypertension          Past Surgical History:  Procedure Laterality Date  . LAPAROSCOPIC CHOLECYSTECTOMY W/ CHOLANGIOGRAPHY  06/10/10   Dr EGreer Pickerel         Family History  Problem Relation Age of Onset  . Hypertension Sister   . Lupus Sister   . Cancer Maternal Grandmother     GI  . Cancer Maternal Aunt     Breast  . Cancer Maternal Uncle     colon        Social History  Substance Use Topics  . Smoking status: Never Smoker  . Smokeless tobacco: Never Used  . Alcohol use No    Allergies:       Allergies  Allergen Reactions  . Codeine Nausea And Vomiting    Shaking, sweating           Prescriptions Prior to Admission  Medication Sig Dispense Refill Last Dose  . acetaminophen (TYLENOL) 500 MG tablet Take 1,000 mg by mouth every 6 (six) hours as needed for headache.   Taking  . aspirin EC 81 MG tablet Take 1 tablet (81 mg total) by mouth daily. 30 tablet 5 Taking  . cetirizine (ZYRTEC) 10 MG tablet Take 10 mg by mouth daily as needed for allergies. Reported on 07/03/2015   Not Taking  . labetalol (NORMODYNE) 200 MG tablet Take 1 tablet (200 mg total) by mouth 2 (two) times daily. 60 tablet 3 Taking  .  Prenatal Multivit-Min-Fe-FA (PRENATAL VITAMINS) 0.8 MG tablet Take 1 tablet by mouth daily. 30 tablet 11 Taking   LabResultsLast48Hours        Results for orders placed or performed during the hospital encounter of 12/28/15 (from the past 48 hour(s))  Urinalysis, Routine w reflex microscopic     Status: Abnormal   Collection Time: 12/28/15  5:00 PM  Result Value Ref Range   Color, Urine YELLOW YELLOW   APPearance CLEAR CLEAR   Specific Gravity, Urine <1.005 (L) 1.005 - 1.030   pH 5.5 5.0 - 8.0   Glucose, UA NEGATIVE NEGATIVE mg/dL   Hgb urine dipstick NEGATIVE NEGATIVE   Bilirubin Urine NEGATIVE NEGATIVE   Ketones, ur NEGATIVE NEGATIVE mg/dL   Protein, ur NEGATIVE NEGATIVE mg/dL   Nitrite NEGATIVE NEGATIVE   Leukocytes, UA NEGATIVE NEGATIVE    Comment: Microscopic not done on urines with negative protein, blood, leukocytes, nitrite, or glucose < 500 mg/dL.  Protein / creatinine ratio, urine  Status: None   Collection Time: 12/28/15  5:00 PM  Result Value Ref Range   Creatinine, Urine 40.00 mg/dL   Total Protein, Urine 6 mg/dL    Comment: NO NORMAL RANGE ESTABLISHED FOR THIS TEST   Protein Creatinine Ratio 0.15 0.00 - 0.15 mg/mg[Cre]  CBC     Status: Abnormal   Collection Time: 12/28/15  5:45 PM  Result Value Ref Range   WBC 8.5 4.0 - 10.5 K/uL   RBC 3.66 (L) 3.87 - 5.11 MIL/uL   Hemoglobin 11.5 (L) 12.0 - 15.0 g/dL   HCT 33.4 (L) 36.0 - 46.0 %   MCV 91.3 78.0 - 100.0 fL   MCH 31.4 26.0 - 34.0 pg   MCHC 34.4 30.0 - 36.0 g/dL   RDW 13.8 11.5 - 15.5 %   Platelets 200 150 - 400 K/uL  Comprehensive metabolic panel     Status: Abnormal (Preliminary result)   Collection Time: 12/28/15  5:45 PM  Result Value Ref Range   Sodium 133 (L) 135 - 145 mmol/L   Potassium 3.7 3.5 - 5.1 mmol/L   Chloride 104 101 - 111 mmol/L   CO2 21 (L) 22 - 32 mmol/L   Glucose, Bld 85 65 - 99 mg/dL   BUN PENDING 6 - 20 mg/dL   Creatinine, Ser 0.62 0.44 -  1.00 mg/dL   Calcium 9.3 8.9 - 10.3 mg/dL   Total Protein 7.1 6.5 - 8.1 g/dL   Albumin 3.0 (L) 3.5 - 5.0 g/dL   AST 20 15 - 41 U/L   ALT 22 14 - 54 U/L   Alkaline Phosphatase 183 (H) 38 - 126 U/L   Total Bilirubin 0.5 0.3 - 1.2 mg/dL   GFR calc non Af Amer >60 >60 mL/min   GFR calc Af Amer >60 >60 mL/min    Comment: (NOTE) The eGFR has been calculated using the CKD EPI equation. This calculation has not been validated in all clinical situations. eGFR's persistently <60 mL/min signify possible Chronic Kidney Disease.   Anion gap 8 5 - 15      Review of Systems  Eyes: Negative for blurred vision and double vision.  Gastrointestinal: Negative for abdominal pain.  Neurological: Positive for headaches.   Physical Exam   Blood pressure 160/96, pulse 85, temperature 98.2 F (36.8 C), resp. rate 18, height _0  (1.651 m), weight 264 lb (119.7 kg), last menstrual period 03/29/2015, unknown if currently breastfeeding.  Patient Vitals for the past 24 hrs:  BP Temp Pulse Resp Height Weight  12/28/15 1847 139/89 - 70 - - -  12/28/15 1832 139/94 - 76 - - -  12/28/15 1817 158/94 - 67 - - -  12/28/15 1804 152/86 - 70 - - -  12/28/15 1747 151/92 - 75 - - -  12/28/15 1732 168/97 - 68 - - -  12/28/15 1716 160/96 - 85 - - -  12/28/15 1712 154/94 98.2 F (36.8 C) 82 18 - -  12/28/15 1705 - - - - _1  (1.651 m) 264 lb (119.7 kg)    Physical Exam  Constitutional: She is oriented to person, place, and time. She appears well-developed and well-nourished. No distress.  HENT:  Head: Normocephalic.  Respiratory: Effort normal.  GI: Soft. She exhibits no distension and no mass. There is no tenderness. There is no rebound and no guarding.  Musculoskeletal: Normal range of motion.  Neurological: She is alert and oriented to person, place, and time. She has normal reflexes. She displays normal  reflexes.  Clonus negative   Skin: Skin is warm. She is not diaphoretic.   Psychiatric: Her behavior is normal.        Assessment and Plan   A:  1. Hypertension in pregnancy, preeclampsia, severe, antepartum, third trimester   2. GBS carrier     P:  Admit to labor and delivery Labetalol protocol  Magnesium infusion  PCN for GBS + Category 1 fetal tracing     Lezlie Lye, NP 12/28/2015 7:09 PM

## 2015-12-28 NOTE — MAU Note (Signed)
Pt stated she started having a headache earlier today. On labetalol for B/P. No visual changes or epigastric pain.

## 2015-12-29 DIAGNOSIS — Z3A38 38 weeks gestation of pregnancy: Secondary | ICD-10-CM

## 2015-12-29 DIAGNOSIS — O113 Pre-existing hypertension with pre-eclampsia, third trimester: Secondary | ICD-10-CM

## 2015-12-29 DIAGNOSIS — O99824 Streptococcus B carrier state complicating childbirth: Secondary | ICD-10-CM

## 2015-12-29 LAB — MAGNESIUM: MAGNESIUM: 5 mg/dL — AB (ref 1.7–2.4)

## 2015-12-29 LAB — RPR: RPR Ser Ql: NONREACTIVE

## 2015-12-29 MED ORDER — ONDANSETRON HCL 4 MG/2ML IJ SOLN: 4.0000 mg | INTRAMUSCULAR | Status: DC | PRN

## 2015-12-29 MED ORDER — PHENYLEPHRINE 40 MCG/ML (10ML) SYRINGE FOR IV PUSH (FOR BLOOD PRESSURE SUPPORT)
80.0000 ug | PREFILLED_SYRINGE | INTRAVENOUS | Status: DC | PRN
  Filled 2015-12-29: qty 5

## 2015-12-29 MED ORDER — ONDANSETRON HCL 4 MG PO TABS: 4.0000 mg | ORAL_TABLET | ORAL | Status: DC | PRN

## 2015-12-29 MED ORDER — EPHEDRINE 5 MG/ML INJ
10.0000 mg | INTRAVENOUS | Status: DC | PRN
  Filled 2015-12-29: qty 4

## 2015-12-29 MED ORDER — IBUPROFEN 600 MG PO TABS
600.0000 mg | ORAL_TABLET | Freq: Four times a day (QID) | ORAL | Status: DC
  Administered 2015-12-29 – 2015-12-31 (×9): 600 mg via ORAL
  Filled 2015-12-29 (×9): qty 1

## 2015-12-29 MED ORDER — ZOLPIDEM TARTRATE 5 MG PO TABS: 5.0000 mg | ORAL_TABLET | Freq: Every evening | ORAL | Status: DC | PRN

## 2015-12-29 MED ORDER — FENTANYL 2.5 MCG/ML BUPIVACAINE 1/10 % EPIDURAL INFUSION (WH - ANES): 14.0000 mL/h | INTRAMUSCULAR | Status: DC | PRN

## 2015-12-29 MED ORDER — PRENATAL MULTIVITAMIN CH
1.0000 | ORAL_TABLET | Freq: Every day | ORAL | Status: DC
  Administered 2015-12-29 – 2015-12-30 (×2): 1 via ORAL
  Filled 2015-12-29 (×2): qty 1

## 2015-12-29 MED ORDER — DIPHENHYDRAMINE HCL 25 MG PO CAPS: 25.0000 mg | ORAL_CAPSULE | Freq: Four times a day (QID) | ORAL | Status: DC | PRN

## 2015-12-29 MED ORDER — OXYCODONE-ACETAMINOPHEN 5-325 MG PO TABS
1.0000 | ORAL_TABLET | Freq: Once | ORAL | Status: AC
  Administered 2015-12-29: 1 via ORAL
  Filled 2015-12-29: qty 1

## 2015-12-29 MED ORDER — LACTATED RINGERS IV SOLN
INTRAVENOUS | Status: DC
  Administered 2015-12-29 – 2015-12-30 (×2): via INTRAVENOUS

## 2015-12-29 MED ORDER — DIBUCAINE 1 % RE OINT
1.0000 | TOPICAL_OINTMENT | RECTAL | Status: DC | PRN
Start: 2015-12-29 — End: 2015-12-31

## 2015-12-29 MED ORDER — SENNOSIDES-DOCUSATE SODIUM 8.6-50 MG PO TABS
2.0000 | ORAL_TABLET | ORAL | Status: DC
  Administered 2015-12-30 (×2): 2 via ORAL
  Filled 2015-12-29 (×2): qty 2

## 2015-12-29 MED ORDER — ACETAMINOPHEN 325 MG PO TABS: 650.0000 mg | ORAL_TABLET | ORAL | Status: DC | PRN

## 2015-12-29 MED ORDER — DOCUSATE SODIUM 100 MG PO CAPS
100.0000 mg | ORAL_CAPSULE | Freq: Two times a day (BID) | ORAL | Status: DC
  Administered 2015-12-29 – 2015-12-31 (×3): 100 mg via ORAL
  Filled 2015-12-29 (×4): qty 1

## 2015-12-29 MED ORDER — MEASLES, MUMPS & RUBELLA VAC ~~LOC~~ INJ
0.5000 mL | INJECTION | Freq: Once | SUBCUTANEOUS | Status: DC
  Filled 2015-12-29: qty 0.5

## 2015-12-29 MED ORDER — BENZOCAINE-MENTHOL 20-0.5 % EX AERO
1.0000 "application " | INHALATION_SPRAY | CUTANEOUS | Status: DC | PRN
  Administered 2015-12-29: 1 via TOPICAL
  Filled 2015-12-29: qty 56

## 2015-12-29 MED ORDER — LACTATED RINGERS IV SOLN: 500.0000 mL | Freq: Once | INTRAVENOUS | Status: DC

## 2015-12-29 MED ORDER — COCONUT OIL OIL
1.0000 | TOPICAL_OIL | Status: DC | PRN
Start: 2015-12-29 — End: 2015-12-31

## 2015-12-29 MED ORDER — DIPHENHYDRAMINE HCL 50 MG/ML IJ SOLN: 12.5000 mg | INTRAMUSCULAR | Status: DC | PRN

## 2015-12-29 MED ORDER — WITCH HAZEL-GLYCERIN EX PADS: 1.0000 "application " | MEDICATED_PAD | CUTANEOUS | Status: DC | PRN

## 2015-12-29 MED ORDER — TETANUS-DIPHTH-ACELL PERTUSSIS 5-2.5-18.5 LF-MCG/0.5 IM SUSP: 0.5000 mL | Freq: Once | INTRAMUSCULAR | Status: DC

## 2015-12-29 MED ORDER — MAGNESIUM SULFATE 50 % IJ SOLN
2.0000 g/h | INTRAVENOUS | Status: AC
  Administered 2015-12-29: 2 g/h via INTRAVENOUS
  Filled 2015-12-29 (×2): qty 80

## 2015-12-29 MED ORDER — SIMETHICONE 80 MG PO CHEW: 80.0000 mg | CHEWABLE_TABLET | ORAL | Status: DC | PRN

## 2015-12-29 NOTE — Plan of Care (Signed)
Problem: Nutritional: Goal: Mothers verbalization of comfort with breastfeeding process will improve Outcome: Not Applicable Date Met: 14/70/92 Bottle feeding only per mom request

## 2015-12-29 NOTE — Lactation Note (Addendum)
This note was copied from a baby's chart. Lactation Consultation Note  Patient Name: Girl Delia HeadyKimberly Machi ZOXWR'UToday's Date: 12/29/2015 Reason for consult: Initial assessment   Baby 6 hours old. Mom had indicated that she wanted to breastfeed, but then [facility] that she is "strictly bottle feeding."   Maternal Data    Feeding Feeding Type: Bottle Fed - Formula Nipple Type: Slow - flow  LATCH Score/Interventions                      Lactation Tools Discussed/Used     Consult Status Consult Status: Complete    Sherlyn HayJennifer D Catricia Scheerer 12/29/2015, 9:01 AM

## 2015-12-30 LAB — COMPREHENSIVE METABOLIC PANEL
ALK PHOS: 158 U/L — AB (ref 38–126)
ALT: 22 U/L (ref 14–54)
AST: 24 U/L (ref 15–41)
Albumin: 2.9 g/dL — ABNORMAL LOW (ref 3.5–5.0)
Anion gap: 7 (ref 5–15)
BUN: <5 mg/dL — ABNORMAL LOW (ref 6–20)
CALCIUM: 7.2 mg/dL — AB (ref 8.9–10.3)
CO2: 23 mmol/L (ref 22–32)
CREATININE: 0.65 mg/dL (ref 0.44–1.00)
Chloride: 105 mmol/L (ref 101–111)
Glucose, Bld: 85 mg/dL (ref 65–99)
Potassium: 3.8 mmol/L (ref 3.5–5.1)
Sodium: 135 mmol/L (ref 135–145)
Total Bilirubin: 0.5 mg/dL (ref 0.3–1.2)
Total Protein: 6.8 g/dL (ref 6.5–8.1)

## 2015-12-30 LAB — CBC
HCT: 33.3 % — ABNORMAL LOW (ref 36.0–46.0)
HEMOGLOBIN: 11 g/dL — AB (ref 12.0–15.0)
MCH: 30.5 pg (ref 26.0–34.0)
MCHC: 33 g/dL (ref 30.0–36.0)
MCV: 92.2 fL (ref 78.0–100.0)
Platelets: 199 10*3/uL (ref 150–400)
RBC: 3.61 MIL/uL — AB (ref 3.87–5.11)
RDW: 14.1 % (ref 11.5–15.5)
WBC: 10.8 10*3/uL — ABNORMAL HIGH (ref 4.0–10.5)

## 2015-12-30 MED ORDER — AMLODIPINE BESYLATE 5 MG PO TABS: 5.0000 mg | ORAL_TABLET | Freq: Every day | ORAL | 1 refills | Status: DC

## 2015-12-30 MED ORDER — AMLODIPINE BESYLATE 10 MG PO TABS
10.0000 mg | ORAL_TABLET | Freq: Every day | ORAL | Status: DC
  Administered 2015-12-31: 10 mg via ORAL
  Filled 2015-12-30: qty 1

## 2015-12-30 MED ORDER — AMLODIPINE BESYLATE 5 MG PO TABS
5.0000 mg | ORAL_TABLET | Freq: Every day | ORAL | Status: DC
  Administered 2015-12-30: 5 mg via ORAL
  Filled 2015-12-30: qty 1

## 2015-12-30 MED ORDER — IBUPROFEN 600 MG PO TABS: 600.0000 mg | ORAL_TABLET | Freq: Four times a day (QID) | ORAL | 0 refills | Status: DC

## 2015-12-30 MED ORDER — AMLODIPINE BESYLATE 5 MG PO TABS
5.0000 mg | ORAL_TABLET | Freq: Once | ORAL | Status: AC
  Administered 2015-12-30: 5 mg via ORAL
  Filled 2015-12-30: qty 1

## 2015-12-30 NOTE — Discharge Summary (Addendum)
OB Discharge Summary     Patient Name: Carol Blair DOB: 02/18/1990 MRN: [id number]  Date of admission: 12/28/2015 Delivering MD: Jaynie CollinsANYANWU, UGONNA A   Date of discharge: 12/31/2015  Admitting diagnosis: 38.2 WKS, HIGH BP, HEADACHE Intrauterine pregnancy: 2343w4d     Secondary diagnosis:  Active Problems:   Hypertension in pregnancy, preeclampsia, severe, third trimester      Discharge diagnosis: Term Pregnancy Delivered and CHTN with superimposed preeclampsia                                                                                                Hospital course:  Induction of Labor With Vaginal Delivery   25 y.o. yo G3P2002 at 6543w4d was admitted to the hospital 12/28/2015 for induction of labor.  Indication for induction: Preeclampsia.  Patient had an uncomplicated labor course as follows: Membrane Rupture Time/Date: 2:00 AM ,12/29/2015   Intrapartum Procedures: Episiotomy: None [1]                                         Lacerations:  2nd degree [3]  Patient had delivery of a Viable infant.  Information for the patient's newborn:  Carol Blair, Girl [name] [[id number]][[id number]]  Delivery Method: Vag-Spont   12/29/2015  Details of delivery can be found in separate delivery note.  Patient had a routine postpartum course. Patient is discharged home 12/31/15. Delivery Note At 2:21 AM a viable female was delivered via Vaginal, Spontaneous Delivery (Presentation: LOA  ).  APGAR:8,9; weight pending at time of note.   Placenta status: spontaneously delivered at 2:28 AM, intact 3VC .   Anesthesia: Local   Lacerations:  2nd degree Suture Repair: 3.0 vicryl Est. Blood Loss (mL):  300  Mom to postpartum.  Baby to Couplet care / Skin to Skin.  Jaynie CollinsUgonna Anyanwu, MD   Patient with elevated BP without symptoms s/p magnesium sulfate. Rx Norvasc provided. Babylove nurse to visit patient and perform BP check   Physical exam  Vitals:   12/30/15 1700 12/30/15 1929 12/31/15 0000 12/31/15 0835   BP: (!) 157/85 (!) 159/96 (!) 142/82 (!) 154/97  Pulse: 63 65 (!) 59 64  Resp:  18 18 18   Temp:  98.8 F (37.1 C) 98 F (36.7 C) 98.5 F (36.9 C)  TempSrc:  Oral Oral Oral  SpO2:  98% 99% 96%  Weight:      Height:       General: alert, cooperative and no distress Lochia: appropriate Uterine Fundus: firm DVT Evaluation: No evidence of DVT seen on physical exam. Negative Homan's sign. Labs: Lab Results  Component Value Date   WBC 10.8 (H) 12/30/2015   HGB 11.0 (L) 12/30/2015   HCT 33.3 (L) 12/30/2015   MCV 92.2 12/30/2015   PLT 199 12/30/2015   CMP Latest Ref Rng & Units 12/30/2015  Glucose 65 - 99 mg/dL 85  BUN 6 - 20 mg/dL <0(U<5(L)  Creatinine 7.250.44 - 1.00 mg/dL 3.660.65  Sodium 440135 - 347145 mmol/L 135  Potassium 3.5 -  5.1 mmol/L 3.8  Chloride 101 - 111 mmol/L 105  CO2 22 - 32 mmol/L 23  Calcium 8.9 - 10.3 mg/dL 7.2(L)  Total Protein 6.5 - 8.1 g/dL 6.8  Total Bilirubin 0.3 - 1.2 mg/dL 0.5  Alkaline Phos 38 - 126 U/L 158(H)  AST 15 - 41 U/L 24  ALT 14 - 54 U/L 22    Discharge instruction: per After Visit Summary and "Baby and Me Booklet".  After visit meds:  Allergies as of 12/31/2015      Reactions   Codeine Nausea And Vomiting   Shaking, sweating      Medication List    STOP taking these medications   aspirin EC 81 MG tablet   labetalol 200 MG tablet Commonly known as:  NORMODYNE     TAKE these medications   acetaminophen 500 MG tablet Commonly known as:  TYLENOL Take 1,000 mg by mouth every 6 (six) hours as needed for headache.   amLODipine 10 MG tablet Commonly known as:  NORVASC Take 1 tablet (10 mg total) by mouth daily. Start taking on:  01/01/2016   cetirizine 10 MG tablet Commonly known as:  ZYRTEC Take 10 mg by mouth daily as needed for allergies. Reported on 07/03/2015   ibuprofen 600 MG tablet Commonly known as:  ADVIL,MOTRIN Take 1 tablet (600 mg total) by mouth every 6 (six) hours.   Prenatal Vitamins 0.8 MG tablet Take 1 tablet by  mouth daily.       Diet: low salt diet  Activity: Advance as tolerated. Pelvic rest for 6 weeks.   Outpatient follow up:1 week for BP check and 6 weeks  Postpartum contraception: IUD Mirena  Newborn Data: Live born female  Birth Weight: 6 lb 14.1 oz (3120 g) APGAR: 8, 9  Baby Feeding: Bottle Disposition:home with mother   12/31/2015 Jaynie CollinsUgonna Anyanwu, MD

## 2015-12-30 NOTE — Progress Notes (Signed)
Post Partum Day 1 Subjective: No complaints, up ad lib, voiding, tolerating PO and + flatus.  Baby is not able to go home, patient desires to stay another day.  No severe preeclampsia symptoms.  Objective: Blood pressure (!) 157/93, pulse 65, temperature 97.9 F (36.6 C), temperature source Oral, resp. rate 18, height 5\' 5"  (1.651 m), weight 256 lb 4 oz (116.2 kg), last menstrual period 03/29/2015, SpO2 100 %, unknown if currently breastfeeding. Vitals:   12/29/15 1941 12/30/15 0000 12/30/15 0500 12/30/15 0800  BP: (!) 156/92 135/82 (!) 149/86 (!) 157/93  Pulse: 85 85 66 65  Resp: 18 18 18 18   Temp: 97.9 F (36.6 C) 97.9 F (36.6 C) 98 F (36.7 C) 97.9 F (36.6 C)  TempSrc: Oral Oral Oral Oral  SpO2: 100%  100% 100%  Weight:   256 lb 4 oz (116.2 kg)   Height:        Physical Exam: (done by Dr. Jolayne Pantheronstant) General: alert and no distress Lochia: appropriate Uterine Fundus: firm DVT Evaluation: No evidence of DVT seen on physical exam. Negative Homan's sign.   Recent Labs  12/28/15 1745 12/30/15 0521  HGB 11.5* 11.0*  HCT 33.4* 33.3*   Results for orders placed or performed during the hospital encounter of 12/28/15 (from the past 24 hour(s))  CBC     Status: Abnormal   Collection Time: 12/30/15  5:21 AM  Result Value Ref Range   WBC 10.8 (H) 4.0 - 10.5 K/uL   RBC 3.61 (L) 3.87 - 5.11 MIL/uL   Hemoglobin 11.0 (L) 12.0 - 15.0 g/dL   HCT 16.133.3 (L) 09.636.0 - 04.546.0 %   MCV 92.2 78.0 - 100.0 fL   MCH 30.5 26.0 - 34.0 pg   MCHC 33.0 30.0 - 36.0 g/dL   RDW 40.914.1 81.111.5 - 91.415.5 %   Platelets 199 150 - 400 K/uL  Comprehensive metabolic panel     Status: Abnormal   Collection Time: 12/30/15  5:21 AM  Result Value Ref Range   Sodium 135 135 - 145 mmol/L   Potassium 3.8 3.5 - 5.1 mmol/L   Chloride 105 101 - 111 mmol/L   CO2 23 22 - 32 mmol/L   Glucose, Bld 85 65 - 99 mg/dL   BUN <5 (L) 6 - 20 mg/dL   Creatinine, Ser 7.820.65 0.44 - 1.00 mg/dL   Calcium 7.2 (L) 8.9 - 10.3 mg/dL   Total  Protein 6.8 6.5 - 8.1 g/dL   Albumin 2.9 (L) 3.5 - 5.0 g/dL   AST 24 15 - 41 U/L   ALT 22 14 - 54 U/L   Alkaline Phosphatase 158 (H) 38 - 126 U/L   Total Bilirubin 0.5 0.3 - 1.2 mg/dL   GFR calc non Af Amer >60 >60 mL/min   GFR calc Af Amer >60 >60 mL/min   Anion gap 7 5 - 15    Assessment/Plan: Norvasc dosage increased to 10 mg daily, will continue to monitor Encourage OOB Routine postpartum care    LOS: 2 days   Jaynie CollinsUgonna Sehaj Kolden, MD 12/30/2015, 3:54 PM

## 2015-12-30 NOTE — Discharge Instructions (Signed)
Postpartum Care After Vaginal Delivery °The period of time right after you deliver your newborn is called the postpartum period. °What kind of medical care will I receive? °· You may continue to receive fluids and medicines through an IV tube inserted into one of your veins. °· If an incision was made near your vagina (episiotomy) or if you had some vaginal tearing during delivery, cold compresses may be placed on your episiotomy or your tear. This helps to reduce pain and swelling. °· You may be given a squirt bottle to use when you go to the bathroom. You may use this until you are comfortable wiping as usual. To use the squirt bottle, follow these steps: °¨ Before you urinate, fill the squirt bottle with warm water. Do not use hot water. °¨ After you urinate, while you are sitting on the toilet, use the squirt bottle to rinse the area around your urethra and vaginal opening. This rinses away any urine and blood. °¨ You may do this instead of wiping. As you start healing, you may use the squirt bottle before wiping yourself. Make sure to wipe gently. °¨ Fill the squirt bottle with clean water every time you use the bathroom. °· You will be given sanitary pads to wear. °How can I expect to feel? °· You may not feel the need to urinate for several hours after delivery. °· You will have some soreness and pain in your abdomen and vagina. °· If you are breastfeeding, you may have uterine contractions every time you breastfeed for up to several weeks postpartum. Uterine contractions help your uterus return to its normal size. °· It is normal to have vaginal bleeding (lochia) after delivery. The amount and appearance of lochia is often similar to a menstrual period in the first week after delivery. It will gradually decrease over the next few weeks to a dry, yellow-brown discharge. For most women, lochia stops completely by 6-8 weeks after delivery. Vaginal bleeding can vary from woman to woman. °· Within the first few  days after delivery, you may have breast engorgement. This is when your breasts feel heavy, full, and uncomfortable. Your breasts may also throb and feel hard, tightly stretched, warm, and tender. After this occurs, you may have milk leaking from your breasts. Your health care provider can help you relieve discomfort due to breast engorgement. Breast engorgement should go away within a few days. °· You may feel more sad or worried than normal due to hormonal changes after delivery. These feelings should not last more than a few days. If these feelings do not go away after several days, speak with your health care provider. °How should I care for myself? °· Tell your health care provider if you have pain or discomfort. °· Drink enough water to keep your urine clear or pale yellow. °· Wash your hands thoroughly with soap and water for at least 20 seconds after changing your sanitary pads, after using the toilet, and before holding or feeding your baby. °· If you are not breastfeeding, avoid touching your breasts a lot. Doing this can make your breasts produce more milk. °· If you become weak or lightheaded, or you feel like you might faint, ask for help before: °¨ Getting out of bed. °¨ Showering. °· Change your sanitary pads frequently. Watch for any changes in your flow, such as a sudden increase in volume, a change in color, the passing of large blood clots. If you pass a blood clot from your vagina, save it   to show to your health care provider. Do not flush blood clots down the toilet without having your health care provider look at them.  Make sure that all your vaccinations are up to date. This can help protect you and your baby from getting certain diseases. You may need to have immunizations done before you leave the hospital.  If desired, talk with your health care provider about methods of family planning or birth control (contraception). How can I start bonding with my baby? Spending as much time as  possible with your baby is very important. During this time, you and your baby can get to know each other and develop a bond. Having your baby stay with you in your room (rooming in) can give you time to get to know your baby. Rooming in can also help you become comfortable caring for your baby. Breastfeeding can also help you bond with your baby. How can I plan for returning home with my baby?  Make sure that you have a car seat installed in your vehicle.  Your car seat should be checked by a certified car seat installer to make sure that it is installed safely.  Make sure that your baby fits into the car seat safely.  Ask your health care provider any questions you have about caring for yourself or your baby. Make sure that you are able to contact your health care provider with any questions after leaving the hospital. This information is not intended to replace advice given to you by your health care provider. Make sure you discuss any questions you have with your health care provider. Document Released: 10/19/2006 Document Revised: 05/27/2015 Document Reviewed: 11/26/2014 Elsevier Interactive Patient Education  2017 Elsevier Inc.    Preeclampsia and Eclampsia Preeclampsia is a serious condition that develops only during pregnancy. It is also called toxemia of pregnancy. This condition causes high blood pressure along with other symptoms, such as swelling and headaches. These symptoms may develop as the condition gets worse. Preeclampsia may occur at 20 weeks of pregnancy or later. Diagnosing and treating preeclampsia early is very important. If not treated early, it can cause serious problems for you and your baby. One problem it can lead to is eclampsia, which is a condition that causes muscle jerking or shaking (convulsions or seizures) in the mother. Delivering your baby is the best treatment for preeclampsia or eclampsia. Preeclampsia and eclampsia symptoms usually go away after your baby  is born. What are the causes? The cause of preeclampsia is not known. What increases the risk? The following risk factors make you more likely to develop preeclampsia:  Being pregnant for the first time.  Having had preeclampsia during a past pregnancy.  Having a family history of preeclampsia.  Having high blood pressure.  Being pregnant with twins or triplets.  Being 6435 or older.  Being African-American.  Having kidney disease or diabetes.  Having medical conditions such as lupus or blood diseases.  Being very overweight (obese). What are the signs or symptoms? The earliest signs of preeclampsia are:  High blood pressure.  Increased protein in your urine. Your health care provider will check for this at every visit before you give birth (prenatal visit). Other symptoms that may develop as the condition gets worse include:  Severe headaches.  Sudden weight gain.  Swelling of the hands, face, legs, and feet.  Nausea and vomiting.  Vision problems, such as blurred or double vision.  Numbness in the face, arms, legs, and feet.  Urinating less than usual.  Dizziness.  Slurred speech.  Abdominal pain, especially upper abdominal pain.  Convulsions or seizures. Symptoms generally go away after giving birth. How is this diagnosed? There are no screening tests for preeclampsia. Your health care provider will ask you about symptoms and check for signs of preeclampsia during your prenatal visits. You may also have tests that include:  Urine tests.  Blood tests.  Checking your blood pressure.  Monitoring your babys heart rate.  Ultrasound. How is this treated? You and your health care provider will determine the treatment approach that is best for you. Treatment may include:  Having more frequent prenatal exams to check for signs of preeclampsia, if you have an increased risk for preeclampsia.  Bed rest.  Reducing how much salt (sodium) you  eat.  Medicine to lower your blood pressure.  Staying in the hospital, if your condition is severe. There, treatment will focus on controlling your blood pressure and the amount of fluids in your body (fluid retention).  You may need to take medicine (magnesium sulfate) to prevent seizures. This medicine may be given as an injection or through an IV tube.  Delivering your baby early, if your condition gets worse. You may have your labor started with medicine (induced), or you may have a cesarean delivery. Follow these instructions at home: Eating and drinking  Drink enough fluid to keep your urine clear or pale yellow.  Eat a healthy diet that is low in sodium. Do not add salt to your food. Check nutrition labels to see how much sodium a food or beverage contains.  Avoid caffeine. Lifestyle  Do not use any products that contain nicotine or tobacco, such as cigarettes and e-cigarettes. If you need help quitting, ask your health care provider.  Do not use alcohol or drugs.  Avoid stress as much as possible. Rest and get plenty of sleep. General instructions  Take over-the-counter and prescription medicines only as told by your health care provider.  When lying down, lie on your side. This keeps pressure off of your baby.  When sitting or lying down, raise (elevate) your feet. Try putting some pillows underneath your lower legs.  Exercise regularly. Ask your health care provider what kinds of exercise are best for you.  Keep all follow-up and prenatal visits as told by your health care provider. This is important. How is this prevented? To prevent preeclampsia or eclampsia from developing during another pregnancy:  Get proper medical care during pregnancy. Your health care provider may be able to prevent preeclampsia or diagnose and treat it early.  Your health care provider may have you take a low-dose aspirin or a calcium supplement during your next pregnancy.  You may have  tests of your blood pressure and kidney function after giving birth.  Maintain a healthy weight. Ask your health care provider for help managing weight gain during pregnancy.  Work with your health care provider to manage any long-term (chronic) health conditions you have, such as diabetes or kidney problems. Contact a health care provider if:  You gain more weight than expected.  You have headaches.  You have nausea or vomiting.  You have abdominal pain.  You feel dizzy or light-headed. Get help right away if:  You develop sudden or severe swelling anywhere in your body. This usually happens in the legs.  You gain 5 lbs (2.3 kg) or more during one week.  You have severe:  Abdominal pain.  Headaches.  Dizziness.  Vision  problems.  Confusion.  Nausea or vomiting.  You have a seizure.  You have trouble moving any part of your body.  You develop numbness in any part of your body.  You have trouble speaking.  You have any abnormal bleeding.  You pass out. This information is not intended to replace advice given to you by your health care provider. Make sure you discuss any questions you have with your health care provider. Document Released: 12/20/1999 Document Revised: 08/20/2015 Document Reviewed: 07/29/2015 Elsevier Interactive Patient Education  2017 [employer]

## 2015-12-31 ENCOUNTER — Other Ambulatory Visit: Payer: Medicaid Other | Admitting: Family Medicine

## 2015-12-31 MED ORDER — AMLODIPINE BESYLATE 10 MG PO TABS: 10.0000 mg | ORAL_TABLET | Freq: Every day | ORAL | 2 refills | Status: DC

## 2015-12-31 NOTE — Progress Notes (Signed)
Discharge teaching complete with pt. Pt understood all instructions and did not have any questions. Pt ambulated out of the hospital and discharged home to family.  

## 2016-01-01 LAB — TYPE AND SCREEN
ABO/RH(D): O POS
Antibody Screen: POSITIVE
DAT, IgG: NEGATIVE
UNIT DIVISION: 0
UNIT DIVISION: 0

## 2016-01-02 ENCOUNTER — Inpatient Hospital Stay (HOSPITAL_COMMUNITY): Admission: RE | Admit: 2016-01-02 | Payer: Medicaid Other | Source: Ambulatory Visit

## 2016-01-09 ENCOUNTER — Encounter: Payer: Self-pay | Admitting: Advanced Practice Midwife

## 2016-02-12 ENCOUNTER — Ambulatory Visit (INDEPENDENT_AMBULATORY_CARE_PROVIDER_SITE_OTHER): Payer: Medicaid Other | Admitting: Medical

## 2016-02-12 ENCOUNTER — Encounter: Payer: Self-pay | Admitting: Medical

## 2016-02-12 ENCOUNTER — Other Ambulatory Visit (HOSPITAL_COMMUNITY)
Admission: RE | Admit: 2016-02-12 | Discharge: 2016-02-12 | Disposition: A | Discharge status: Home / Self Care | Payer: Medicaid Other | Source: Ambulatory Visit | Attending: Obstetrics and Gynecology | Admitting: Obstetrics and Gynecology

## 2016-02-12 DIAGNOSIS — Z3202 Encounter for pregnancy test, result negative: Secondary | ICD-10-CM

## 2016-02-12 DIAGNOSIS — Z01411 Encounter for gynecological examination (general) (routine) with abnormal findings: Secondary | ICD-10-CM | POA: Diagnosis present

## 2016-02-12 DIAGNOSIS — Z30430 Encounter for insertion of intrauterine contraceptive device: Secondary | ICD-10-CM

## 2016-02-12 DIAGNOSIS — Z124 Encounter for screening for malignant neoplasm of cervix: Secondary | ICD-10-CM | POA: Diagnosis not present

## 2016-02-12 DIAGNOSIS — I10 Essential (primary) hypertension: Secondary | ICD-10-CM

## 2016-02-12 LAB — POCT PREGNANCY, URINE: PREG TEST UR: NEGATIVE

## 2016-02-12 MED ORDER — LEVONORGESTREL 18.6 MCG/DAY IU IUD
INTRAUTERINE_SYSTEM | Freq: Once | INTRAUTERINE | Status: AC
  Administered 2016-02-12: 10:00:00 1 via INTRAUTERINE

## 2016-02-12 NOTE — Progress Notes (Signed)
Subjective:     Carol FarberKimberly N Blair is a 26 y.o. female who presents for a postpartum visit. She is 6 weeks postpartum following a spontaneous vaginal delivery. I have fully reviewed the prenatal and intrapartum course. The delivery was at 38 gestational weeks. Outcome: spontaneous vaginal delivery. Anesthesia: none. [facility]partum [facility] superimposed [facility], now on norvasc 10mg  daily. Baby's course has been unremarkable. Baby is feeding by bottle - Similac Isomil. Bleeding no bleeding. Bowel function is normal. Bladder function is normal. Patient is not sexually active. Contraception method is none. Postpartum depression screening: negative.  The following portions of the patient's history were reviewed and updated as appropriate: allergies, current medications, past family history, past medical history, past social history, past surgical history and problem list.  Review of Systems Pertinent items are noted in HPI.   Objective:    BP (!) 155/76   Pulse (!) 55   Ht 5\' 5"  (1.651 m)   Wt 247 lb 14.4 oz (112.4 kg)   LMP 02/18/2015   Breastfeeding? No   BMI 41.25 kg/m   General:  alert and cooperative   Breasts:  not performed  Lungs:  normal effort of breathing  Heart:  Normal rate  Abdomen: soft, non-tender   Vulva:  normal  Vagina: normal vagina and small white discharge, no bleeding  Cervix:  normal contour, no lesions, scant bleeding following pap  Corpus: normal  Adnexa:  not evaluated  Rectal Exam: Not performed.         GYNECOLOGY CLINIC PROCEDURE NOTE   IUD Insertion Procedure Note Patient identified, informed consent performed.  Discussed risks of irregular bleeding, cramping, infection, malpositioning or misplacement of the IUD outside the uterus which may require further procedure such as laparoscopy. Time out was performed.  Urine pregnancy test negative.  Speculum placed in the vagina.  Cervix visualized.  Cleaned with Betadine x 2.   Grasped anteriorly with a single tooth tenaculum.  Uterus sounded to 6 cm.  Liletta IUD placed per manufacturer's recommendations.  Strings trimmed to 3 cm. Tenaculum was removed, good hemostasis noted.  Patient tolerated procedure well.   Patient was given post-procedure instructions.  She was advised to be have backup contraception for one week.  Patient was also asked to check IUD strings periodically and follow up in 4 weeks for IUD check.  Assessment:     Normal postpartum exam. Pap smear done at today's visit.  IUD Insertion CHTN   Plan:    1. Contraception: IUD 2. Patient advised to follow-up with PCP at [facility] for continued management of CHTN, continue Norvasc as previously prescribed for now.  3. Warning signs for worsening HTN and CVA discussed  4.  Follow up in: 4 weeks for string check or sooner as needed.    Marny LowensteinJulie N Wenzel, PA-C 02/12/2016 9:36 AM

## 2016-02-12 NOTE — Patient Instructions (Signed)

## 2016-02-14 LAB — CYTOLOGY - PAP

## 2016-02-24 ENCOUNTER — Telehealth: Payer: Self-pay | Admitting: *Deleted

## 2016-02-24 NOTE — Telephone Encounter (Signed)
Per message from Vonzella NippleJulie Wenzel, PA need to call patient and notify her of results of pap and need for colposcopy.    I called Carol BradfordKimberly home and mobile numbers and left messages I am calling with some information- please call the office

## 2016-02-25 NOTE — Telephone Encounter (Addendum)
Pt left message yesterday @ 1444 stating that she missed our call and wants a call back. I returned pt's call and informed her of abnormal Pap which requires follow up with Colposcopy. She has had this procedure previously and voiced understanding. Colposcopy will be performed @ appt for IUD string check on 3/21.  Pt agreed.

## 2016-03-09 ENCOUNTER — Ambulatory Visit (INDEPENDENT_AMBULATORY_CARE_PROVIDER_SITE_OTHER): Payer: Medicaid Other | Admitting: Internal Medicine

## 2016-03-09 ENCOUNTER — Encounter: Payer: Self-pay | Admitting: Internal Medicine

## 2016-03-09 ENCOUNTER — Other Ambulatory Visit (HOSPITAL_COMMUNITY)
Admission: RE | Admit: 2016-03-09 | Discharge: 2016-03-09 | Disposition: A | Discharge status: Home / Self Care | Payer: Medicaid Other | Source: Ambulatory Visit | Attending: Family Medicine | Admitting: Family Medicine

## 2016-03-09 VITALS — BP 142/94 | HR 88 | Temp 97.4°F | Ht 65.0 in | Wt 251.0 lb

## 2016-03-09 DIAGNOSIS — Z113 Encounter for screening for infections with a predominantly sexual mode of transmission: Secondary | ICD-10-CM | POA: Diagnosis not present

## 2016-03-09 DIAGNOSIS — I10 Essential (primary) hypertension: Secondary | ICD-10-CM | POA: Diagnosis not present

## 2016-03-09 LAB — POCT WET PREP (WET MOUNT)
Clue Cells Wet Prep Whiff POC: NEGATIVE
Trichomonas Wet Prep HPF POC: ABSENT

## 2016-03-09 MED ORDER — AMLODIPINE BESYLATE 10 MG PO TABS: 10.0000 mg | ORAL_TABLET | Freq: Every day | ORAL | 2 refills | Status: DC

## 2016-03-09 NOTE — Patient Instructions (Signed)
[name],  I will call you with your results later this week.  Thank you, Dr. Sampson GoonFitzgerald   Colposcopy Colposcopy is a procedure to examine the lowest part of the uterus (cervix), the vagina, and the area around the vaginal opening (vulva) for abnormalities or signs of disease. The procedure is done using a lighted microscope or magnifying lens (colposcope). If any unusual cells are found during the procedure, your health care provider may remove a tissue sample for testing (biopsy). A colposcopy may be done if you:  Have an abnormal Pap test. A Pap test is a screening test that is used to check for signs of cancer or infection of the vagina, cervix, and uterus.  Have a Pap smear test in which you test positive for high-risk HPV (human papillomavirus).  Have a sore or lesion on your cervix.  Have genital warts on your vulva, vagina, or cervix.  Took certain medicines while pregnant, such as diethylstilbestrol (DES).  Have pain during sexual intercourse.  Have vaginal bleeding, especially after sexual intercourse.  Need to have a cervical polyp removed.  Need to have a lost intrauterine device (IUD) string located. Let your health care provider know about:  Any allergies you have, including allergies to prescribed medicine, latex, or iodine.  All medicines you are taking, including vitamins, herbs, eye drops, creams, and over-the-counter medicines. Bring a list of all of your medicines to your appointment.  Any problems you or family members have had with anesthetic medicines.  Any blood disorders you have.  Any surgeries you have had.  Any medical conditions you have, such as pelvic inflammatory disease (PID) or endometrial disorder.  Any history of frequent fainting.  Your menstrual cycle and what form of birth control (contraception) you use.  Your medical history, including any prior cervical treatment.  Whether you are pregnant or may be pregnant. What are the  risks? Generally, this is a safe procedure. However, problems may occur, including:  Pain.  Infection, which may include a fever, bad-smelling discharge, or pelvic pain.  Bleeding or discharge.  Misdiagnosis.  Fainting and vasovagal reactions, but this is rare.  Allergic reactions to medicines.  Damage to other structures or organs. What happens before the procedure?  If you have your menstrual period or will have it at the time of your procedure, tell your health care provider. A colposcopy typically is not done during menstruation.  Continue your contraceptive practices before and after the procedure.  For 24 hours before the colposcopy:  Do not douche.  Do not use tampons.  Do not use medicines, creams, or suppositories in the vagina.  Do not have sexual intercourse.  Ask your health care provider about:  Changing or stopping your regular medicines. This is especially important if you are taking diabetes medicines or blood thinners.  Taking medicines such as aspirin and ibuprofen. These medicines can thin your blood. Do not take these medicines before your procedure if your health care provider instructs you not to. It is likely that your health care provider will tell you to avoid taking aspirin or medicine that contains aspirin for 7 days before the procedure.  Follow instructions from your health care provider about eating or drinking restrictions. You will likely need to eat a regular diet the day of the procedure and not skip any meals.  You may have an exam or testing. A pregnancy test will be taken on the day of the procedure.  You may have a blood or urine sample  taken.  Plan to have someone take you home from the hospital or clinic.  If you will be going home right after the procedure, plan to have someone with you for 24 hours. What happens during the procedure?  You will lie down on your back, with your feet in foot rests (stirrups).  A warmed and  lubricated instrument (speculum) will be inserted into your vagina. The speculum will be used to hold apart the walls of your vagina so your health care provider can see your cervix and the inside of your vagina.  A cotton swab will be used to place a small amount of liquid solution on the areas to be examined. This solution makes it easier to see abnormal cells. You may feel a slight burning during this part.  The colposcope will be used to scan the cervix with a bright white light. The colposcope will be held near your vulvaand will magnify your vulva, vagina, and cervix for easier examination.  Your health care provider may decide to take a biopsy. If so:  You may be given medicine to numb the area (local anesthetic).  Surgical instruments will be used to suck out mucus and cells through your vagina.  You may feel mild pain while the tissue sample is removed.  Bleeding may occur. A solution may be used to stop the bleeding.  If a sample of tissue is needed from the inside of the cervix, a different procedure called endocervical curettage (ECC) may be completed. During this procedure, a curved instrument (curette) will be used to scrape cells from your cervix or the top of your cervix (endocervix).  Your health care provider will record the location of any abnormalities. The procedure may vary among health care providers and hospitals. What happens after the procedure?  You will lie down and rest for a few minutes. You may be offered juice or cookies.  Your blood pressure, heart rate, breathing rate, and blood oxygen level will be monitored until any medicines you were given have worn off.  You may have to wear compression stockings. These stockings help to prevent blood clots and reduce swelling in your legs.  You may have some cramping in your abdomen. This should go away after a few minutes. This information is not intended to replace advice given to you by your health care  provider. Make sure you discuss any questions you have with your health care provider. Document Released: 03/14/2002 Document Revised: 08/20/2015 Document Reviewed: 07/29/2015 Elsevier Interactive Patient Education  2017 [employer].

## 2016-03-09 NOTE — Progress Notes (Signed)
[facility] Progress Note  Subjective:  Carol Blair is a 26 y.o. female with history of obesity and HTN, including gestational hypertension, who presents for SDA for STD check.  #STD check: - Patient concerned because her partner has epididymitis  - Denies symptoms but wants to be sure she does not have an STD - Had IUD placed last month and does not use condoms for protection - Sexually active with 1 female partner - Denies dysuria, increased discharge, dyspareunia, abdominal pain - No fevers or chills - Had LSIL on recent pap and is to have colposcopy next week  #HTN: - Restarted on norvasc 10 mg after pregnancy - Denies extremity swelling - Denies trouble taking her medication but will need refill soon  Objective: Blood pressure (!) 142/94, pulse 88, temperature 97.4 F (36.3 C), temperature source Oral, height 5\' 5"  (1.651 m), weight 251 lb (113.9 kg), SpO2 99 %, not currently breastfeeding. Body mass index is 41.77 kg/m. Constitutional: Obese female, in NAD Cardiovascular: RRR, S1, S2, no m/r/g.  Abdominal: Soft. +BS, NT, ND GU: Minimal clear discharge on speculum exam with chaperone present. No cervical motion tenderness on bimanual exam.  Vitals reviewed  Assessment/Plan: Screen for STD (sexually transmitted disease) - Exam benign but concern for exposure. - Obtained wet prep and gc/chlamydia testing. - RPR and HIV screening ordered. - Will inform patient of results when available.   Hypertension - Just above goal of <140/90.  - Refilled norvasc 10 mg - Would add HCTZ at next visit if still above goal  Follow-up prn and if blood pressure readings remain above 140/90.  Dani GobbleHillary Mekesha Solomon, MD [facility], PGY-2

## 2016-03-10 DIAGNOSIS — Z113 Encounter for screening for infections with a predominantly sexual mode of transmission: Secondary | ICD-10-CM | POA: Insufficient documentation

## 2016-03-10 LAB — RPR

## 2016-03-10 LAB — GC/CHLAMYDIA PROBE AMP (~~LOC~~) NOT AT ARMC
Chlamydia: NEGATIVE
Neisseria Gonorrhea: NEGATIVE

## 2016-03-10 LAB — HIV ANTIBODY (ROUTINE TESTING W REFLEX): HIV 1&2 Ab, 4th Generation: NONREACTIVE

## 2016-03-10 NOTE — Assessment & Plan Note (Signed)
-   Exam benign but concern for exposure. - Obtained wet prep and gc/chlamydia testing. - RPR and HIV screening ordered. - Will inform patient of results when available.

## 2016-03-10 NOTE — Assessment & Plan Note (Signed)
-   Just above goal of <140/90.  - Refilled norvasc 10 mg - Would add HCTZ at next visit if still above goal

## 2016-03-16 ENCOUNTER — Ambulatory Visit: Payer: Self-pay | Admitting: Medical

## 2016-11-03 ENCOUNTER — Telehealth: Payer: Self-pay

## 2016-11-03 NOTE — Telephone Encounter (Signed)
FYI: Called patient and told her that she should follow up for the pain she has been having for 10 months post the insertion of the BrittonLiletta IUD.  Her complaint was that she had called and left several messages here on the nurse line with no response.  I consulted and was advised by Renard HamperAsha to [facility]. Patient said she would.Glennie HawkSimpson, Lesia Monica R

## 2016-11-04 ENCOUNTER — Encounter: Payer: Self-pay | Admitting: Medical

## 2016-11-04 ENCOUNTER — Ambulatory Visit (INDEPENDENT_AMBULATORY_CARE_PROVIDER_SITE_OTHER): Payer: Medicaid Other | Admitting: Medical

## 2016-11-04 VITALS — BP 178/111 | HR 84 | Wt 265.4 lb

## 2016-11-04 DIAGNOSIS — Z309 Encounter for contraceptive management, unspecified: Secondary | ICD-10-CM

## 2016-11-04 DIAGNOSIS — I10 Essential (primary) hypertension: Secondary | ICD-10-CM

## 2016-11-04 DIAGNOSIS — R87612 Low grade squamous intraepithelial lesion on cytologic smear of cervix (LGSIL): Secondary | ICD-10-CM

## 2016-11-04 DIAGNOSIS — T8332XA Displacement of intrauterine contraceptive device, initial encounter: Secondary | ICD-10-CM

## 2016-11-04 MED ORDER — LISINOPRIL 10 MG PO TABS: 10.0000 mg | ORAL_TABLET | Freq: Every day | ORAL | 1 refills | Status: DC

## 2016-11-04 NOTE — Patient Instructions (Signed)
Call for follow-up for blood pressure:   Carol Blair - [phone]  [facility] - [phone]   Pap Test Why am I having this test? A pap test is sometimes called a pap smear. It is a screening test that is used to check for signs of cancer of the vagina, cervix, and uterus. The test can also identify the presence of infection or precancerous changes. Your health care provider will likely recommend you have this test done on a regular basis. This test may be done:  Every 3 years, starting at age 27.  Every 5 years, in combination with testing for the presence of human papillomavirus (HPV).  More or less often depending on other medical conditions.  What kind of sample is taken? Using a small cotton swab, plastic spatula, or brush, your health care provider will collect a sample of cells from the surface of your cervix. Your cervix is the opening to your uterus, also called a womb. Secretions from the cervix and vagina may also be collected. How do I prepare for this test?  Be aware of where you are in your menstrual cycle. You may be asked to reschedule the test if you are menstruating on the day of the test.  You may need to reschedule if you have a known vaginal infection on the day of the test.  You may be asked to avoid douching or taking a bath the day before or the day of the test.  Some medicines can cause abnormal test results, such as digitalis and tetracycline. Talk with your health care provider before your test if you take one of these medicines. What do the results mean? Abnormal test results may indicate a number of health conditions. These may include:  Cancer. Although pap test results cannot be used to diagnose cancer of the cervix, vagina, or uterus, they may suggest the possibility of cancer. Further tests would be required to determine if cancer is present.  Sexually transmitted disease.  Fungal infection.  Parasite infection.  Herpes  infection.  A condition causing or contributing to infertility.  It is your responsibility to obtain your test results. Ask the lab or department performing the test when and how you will get your results. Contact your health care provider to discuss any questions you have about your results. Talk with your health care provider to discuss your results, treatment options, and if necessary, the need for more tests. Talk with your health care provider if you have any questions about your results. This information is not intended to replace advice given to you by your health care provider. Make sure you discuss any questions you have with your health care provider. Document Released: 03/14/2002 Document Revised: 08/28/2015 Document Reviewed: 05/15/2013 Elsevier Interactive Patient Education  2018 [employer]   Hypertension Hypertension is another name for high blood pressure. High blood pressure forces your heart to work harder to pump blood. This can cause problems over time. There are two numbers in a blood pressure reading. There is a top number (systolic) over a bottom number (diastolic). It is best to have a blood pressure below 120/80. Healthy choices can help lower your blood pressure. You may need medicine to help lower your blood pressure if:  Your blood pressure cannot be lowered with healthy choices.  Your blood pressure is higher than 130/80.  Follow these instructions at home: Eating and drinking  If directed, follow the DASH eating plan. This diet includes: ? Filling half  of your plate at each meal with fruits and vegetables. ? Filling one quarter of your plate at each meal with whole grains. Whole grains include whole wheat pasta, brown rice, and whole grain bread. ? Eating or drinking low-fat dairy products, such as skim milk or low-fat yogurt. ? Filling one quarter of your plate at each meal with low-fat (lean) proteins. Low-fat proteins include fish, skinless chicken, eggs,  beans, and tofu. ? Avoiding fatty meat, cured and processed meat, or chicken with skin. ? Avoiding premade or processed food.  Eat less than 1,500 mg of salt (sodium) a day.  Limit alcohol use to no more than 1 drink a day for nonpregnant women and 2 drinks a day for men. One drink equals 12 oz of beer, 5 oz of wine, or 1 oz of hard liquor. Lifestyle  Work with your doctor to stay at a healthy weight or to lose weight. Ask your doctor what the best weight is for you.  Get at least 30 minutes of exercise that causes your heart to beat faster (aerobic exercise) most days of the week. This may include walking, swimming, or biking.  Get at least 30 minutes of exercise that strengthens your muscles (resistance exercise) at least 3 days a week. This may include lifting weights or pilates.  Do not use any products that contain nicotine or tobacco. This includes cigarettes and e-cigarettes. If you need help quitting, ask your doctor.  Check your blood pressure at home as told by your doctor.  Keep all follow-up visits as told by your doctor. This is important. Medicines  Take over-the-counter and prescription medicines only as told by your doctor. Follow directions carefully.  Do not skip doses of blood pressure medicine. The medicine does not work as well if you skip doses. Skipping doses also puts you at risk for problems.  Ask your doctor about side effects or reactions to medicines that you should watch for. Contact a doctor if:  You think you are having a reaction to the medicine you are taking.  You have headaches that keep coming back (recurring).  You feel dizzy.  You have swelling in your ankles.  You have trouble with your vision. Get help right away if:  You get a very bad headache.  You start to feel confused.  You feel weak or numb.  You feel faint.  You get very bad pain in your: ? Chest. ? Belly (abdomen).  You throw up (vomit) more than once.  You have  trouble breathing. Summary  Hypertension is another name for high blood pressure.  Making healthy choices can help lower blood pressure. If your blood pressure cannot be controlled with healthy choices, you may need to take medicine. This information is not intended to replace advice given to you by your health care provider. Make sure you discuss any questions you have with your health care provider. Document Released: 06/10/2007 Document Revised: 11/20/2015 Document Reviewed: 11/20/2015 Elsevier Interactive Patient Education  [employer].

## 2016-11-04 NOTE — Progress Notes (Signed)
Pt stated having deep pain lower abdominal going down the leg.

## 2016-11-04 NOTE — Progress Notes (Signed)
Patient ID: Carol Blair, female   DOB: 05/01/1990, [age] y.o.   MRN: [id number]  History:  [name] is a 26 y.o. [id number] who presents to clinic today for IUD check and LLQ abdominal pain. The patient states that she is unsure if the IUD is causing her pain. It is sharp and intermittent in the LLQ and can sometimes radiate down her thigh. She states that the pain actually started during her pregnancy and she was told it could be sciatic nerve pain. She denies vaginal bleeding, abnormal discharge, GI symptoms, UTI symptoms or fever. She states pain usually resolves spontaneously. She has tried Tylenol and Ibuprofen in the past when needed.    Patient had abnormal pap smear, LSIL, in February and [facility] She did not go to the appointment because she was afraid of the diagnosis. She has also not followed-up with [facility] for HTN since her pregnancy. She has CHTN and should be on Norvasc 5 mg daily, but has not been taking it. She denies headache, visual changes, chest pain or sensory changes today.   The following portions of the patient's history were reviewed and updated as appropriate: allergies, current medications, family history, past medical history, social history, past surgical history and problem list.  Review of Systems:  Review of Systems  Constitutional: Negative for fever and malaise/fatigue.  Gastrointestinal: Positive for abdominal pain. Negative for constipation, diarrhea, nausea and vomiting.  Genitourinary: Negative for dysuria, frequency and urgency.       Neg - vaginal bleeding, discharge  Musculoskeletal: Negative for back pain.      Objective:  Physical Exam BP (!) 178/111 (BP Location: Left Arm, Patient Position: Sitting, Cuff Size: Large)   Pulse 84   Wt 265 lb 6.4 oz (120.4 kg)   Breastfeeding? No   BMI 44.16 kg/m  Physical Exam  Constitutional: She is oriented to person, place, and time. She appears well-developed and  well-nourished. No distress.  HENT:  Head: Normocephalic.  Cardiovascular: Normal rate.   Pulmonary/Chest: Effort normal.  Abdominal: Soft. She exhibits no distension and no mass. There is no tenderness. There is no rebound and no guarding.  Genitourinary: Uterus is not enlarged and not tender. Cervix exhibits no motion tenderness, no discharge and no friability. Right adnexum displays no mass and no tenderness. Left adnexum displays no mass and no tenderness. No bleeding in the vagina. Vaginal discharge (small, white, without odor) found.  Genitourinary Comments: Unable to visualize or palpate IUD strings  Neurological: She is alert and oriented to person, place, and time.  Skin: Skin is warm and dry. No erythema.  Psychiatric: She has a normal mood and affect.  Vitals reviewed.   Assessment & Plan:  1. Intrauterine contraceptive device threads lost, initial encounter - US Transvaginal Non-OB; scheduled   2. Essential hypertension - lisinopril (PRINIVIL,ZESTRIL) 10 MG tablet; Take 1 tablet (10 mg total) by mouth daily.  Dispense: 30 tablet; Refill: 1 - Contact information for [facility] for HTN follow-up since she only has [employer] Medicaid - Warning signs for CVA, MI and worsening HTN discussed. Patient advised [facility] if symptoms arise   3. Low grade squamous intraepithelial lesion on cytologic smear of cervix (LGSIL) - Referred to [facility] for management    Marny LowensteinWenzel, Theresia Pree N, PA-C 11/04/2016 9:26 AM

## 2016-11-11 ENCOUNTER — Ambulatory Visit (HOSPITAL_COMMUNITY)
Admission: RE | Admit: 2016-11-11 | Discharge: 2016-11-11 | Disposition: A | Discharge status: Home / Self Care | Payer: Medicaid Other | Source: Ambulatory Visit | Attending: Medical | Admitting: Medical

## 2016-11-11 DIAGNOSIS — N83202 Unspecified ovarian cyst, left side: Secondary | ICD-10-CM | POA: Insufficient documentation

## 2016-11-11 DIAGNOSIS — T8332XA Displacement of intrauterine contraceptive device, initial encounter: Secondary | ICD-10-CM | POA: Insufficient documentation

## 2016-11-12 ENCOUNTER — Encounter: Payer: Self-pay | Admitting: General Practice

## 2016-12-08 ENCOUNTER — Encounter (HOSPITAL_COMMUNITY): Payer: Self-pay

## 2016-12-08 ENCOUNTER — Ambulatory Visit (HOSPITAL_COMMUNITY)
Admission: RE | Admit: 2016-12-08 | Discharge: 2016-12-08 | Disposition: A | Discharge status: Home / Self Care | Payer: Self-pay | Source: Ambulatory Visit | Attending: Obstetrics and Gynecology | Admitting: Obstetrics and Gynecology

## 2016-12-08 VITALS — BP 148/100 | Temp 98.7°F | Ht 64.0 in | Wt 262.4 lb

## 2016-12-08 DIAGNOSIS — Z01419 Encounter for gynecological examination (general) (routine) without abnormal findings: Secondary | ICD-10-CM

## 2016-12-08 NOTE — Patient Instructions (Signed)
Explained breast self awareness with Mickey FarberKimberly N Freeney. Let patient know that follow-up for today's Pap smear will depend on the result. Let patient know will follow up with her within the next couple weeks with results of Pap smear by letter or phone. Informed patient that she will need a screening mammogram at age 26 unless clinically indicated prior. Mickey FarberKimberly N Callies verbalized understanding.  Chee Dimon, Kathaleen Maserhristine Poll, RN 12:56 PM

## 2016-12-08 NOTE — Addendum Note (Signed)
Encounter addended by: Eunice BlaseGreen, Nevae Pinnix M, LPN on: 16/1/096012/04/2016 2:33 PM  Actions taken: Order list changed

## 2016-12-08 NOTE — Progress Notes (Signed)
Patient referred to [facility] due to having an abnormal Pap smear 02/12/2016 that a colposcopy was recommended for follow-up. Due to last Pap smear was greater than 76-months ago a repeat Pap smear is recommended.  Pap Smear: Pap smear completed today. [facility] and LGSIL with cells suggestive of a higher grade. Patient has a history of one other abnormal Pap smear 02/26/2011 that was ASCUS with positive HPV that a colposcopy was completed for follow-up. Last three Pap smear results are in Epic.  Physical exam: Breasts Breasts symmetrical. No skin abnormalities bilateral breasts. No nipple retraction bilateral breasts. No nipple discharge bilateral breasts. No lymphadenopathy. No lumps palpated bilateral breasts. No complaints of pain or tenderness on exam. Screening mammogram recommended at age 26 unless clinically indicated prior.  Pelvic/Bimanual   Ext Genitalia No lesions, no swelling and no discharge observed on external genitalia.         Vagina Vagina pink and normal texture. No lesions or discharge observed in vagina.          Cervix Cervix is present. Cervix pink and of normal texture. No discharge observed.     Uterus Uterus is present and palpable. Uterus in normal position and normal size.        Adnexae Bilateral ovaries present and palpable. No tenderness on palpation.          Rectovaginal No rectal exam completed today since patient had no rectal complaints. No skin abnormalities observed on exam.    Smoking History: Patient has never smoked.  Patient Navigation: Patient education provided. Access to services provided for [facility] through [facility].

## 2016-12-09 ENCOUNTER — Encounter (HOSPITAL_COMMUNITY): Payer: Self-pay | Admitting: *Deleted

## 2016-12-10 LAB — CYTOLOGY - PAP
DIAGNOSIS: HIGH — AB
HPV (WINDOPATH): DETECTED — AB

## 2016-12-11 ENCOUNTER — Encounter: Payer: Self-pay | Admitting: Obstetrics & Gynecology

## 2016-12-11 ENCOUNTER — Ambulatory Visit (INDEPENDENT_AMBULATORY_CARE_PROVIDER_SITE_OTHER): Payer: Medicaid Other | Admitting: Obstetrics & Gynecology

## 2016-12-11 VITALS — BP 157/79 | HR 65 | Wt 261.3 lb

## 2016-12-11 DIAGNOSIS — Z23 Encounter for immunization: Secondary | ICD-10-CM

## 2016-12-11 DIAGNOSIS — N83202 Unspecified ovarian cyst, left side: Secondary | ICD-10-CM

## 2016-12-11 DIAGNOSIS — Z0000 Encounter for general adult medical examination without abnormal findings: Secondary | ICD-10-CM

## 2016-12-11 NOTE — Progress Notes (Signed)
Patient ID: Carol Blair Vincelette, female   DOB: 07/26/1990, 26 y.o.   MRN: [id number]  Chief Complaint  Patient presents with  . Pelvic Pain    HPI Carol Blair Corzine is a 26 y.o. female.  Single P3 (1, 5, and 27 yo kids) here for follow up after u/s for IUD placement showed a left ovarian cyst, 4 cm, complex. She does have some pelvic pain. HPI  Past Medical History:  Diagnosis Date  . Benign essential hypertension antepartum 02/11/2011  . Hypertension   . Obesity   . Pregnancy induced hypertension     Past Surgical History:  Procedure Laterality Date  . LAPAROSCOPIC CHOLECYSTECTOMY W/ CHOLANGIOGRAPHY  06/10/10   Dr Gaynelle AduEric Wilson    Family History  Problem Relation Age of Onset  . Hypertension Sister   . Kidney disease Sister   . Cancer Maternal Grandmother        GI  . Breast cancer Maternal Aunt   . Cancer Maternal Uncle        colon    Social History Social History   Tobacco Use  . Smoking status: Never Smoker  . Smokeless tobacco: Never Used  Substance Use Topics  . Alcohol use: No  . Drug use: No    Allergies  Allergen Reactions  . Codeine Nausea And Vomiting    Shaking, sweating    Current Outpatient Medications  Medication Sig Dispense Refill  . acetaminophen (TYLENOL) 500 MG tablet Take 1,000 mg by mouth every 6 (six) hours as needed for headache.    . ibuprofen (ADVIL,MOTRIN) 600 MG tablet Take 1 tablet (600 mg total) by mouth every 6 (six) hours. 30 tablet 0  . lisinopril (PRINIVIL,ZESTRIL) 10 MG tablet Take 1 tablet (10 mg total) by mouth daily. 30 tablet 1  . cetirizine (ZYRTEC) 10 MG tablet Take 10 mg by mouth daily as needed for allergies. Reported on 07/03/2015     No current facility-administered medications for this visit.     Review of Systems Review of Systems  Blood pressure (!) 157/79, pulse 65, weight 261 lb 4.8 oz (118.5 kg), not currently breastfeeding.  Physical Exam Physical Exam  Data Reviewed Cytology - PAP  Order: [id number]  Status:   Edited Result - FINAL Visible to patient:  No (Not Released) Next appt:  None  Component 3d ago  Adequacy Satisfactory for evaluation endocervical/transformation zone component PRESENT. Abnormal    Diagnosis HIGH GRADE SQUAMOUS INTRAEPITHELIAL LESION: CIN-2/ CIN-3/CIS (HSIL). Abnormal    HPV DETECTED Abnormal    Comment: Normal Reference Range - NOT Detected  Material Submitted CervicoVaginal Pap [ThinPrep Imaged] Abnormal         IMPRESSION: IUD in expected position within the uterus.  Complicated cystic lesion of the LEFT ovary 3.9 cm in greatest size containing multiple septations, some which are slightly irregular, and scattered internal echogenicity/debris.  Consider surgical evaluation.  Assessment    CIN2/3 on pap, she has a h/o colpo in the past. She declines to have it done today Left ovarian cyst- complex    Plan    Plan colpo at next visit Get CA-125 today Flu vaccine today Come back 2 weeks       Jullia Mulligan C Marveline Profeta 12/11/2016, 8:40 AM

## 2016-12-12 LAB — CA 125: Cancer Antigen (CA) 125: 16.4 U/mL (ref 0.0–38.1)

## 2016-12-16 ENCOUNTER — Telehealth (HOSPITAL_COMMUNITY): Payer: Self-pay | Admitting: *Deleted

## 2016-12-16 NOTE — Telephone Encounter (Signed)
Telephoned patient and patient is aware of abnormal pap smear. Patient is aware of colposcopy appointment.

## 2016-12-23 ENCOUNTER — Encounter: Payer: Self-pay | Admitting: Obstetrics & Gynecology

## 2016-12-23 ENCOUNTER — Ambulatory Visit (INDEPENDENT_AMBULATORY_CARE_PROVIDER_SITE_OTHER): Payer: Medicaid Other | Admitting: Obstetrics & Gynecology

## 2016-12-23 ENCOUNTER — Other Ambulatory Visit (HOSPITAL_COMMUNITY)
Admission: RE | Admit: 2016-12-23 | Discharge: 2016-12-23 | Disposition: A | Discharge status: Home / Self Care | Payer: Medicaid Other | Source: Ambulatory Visit | Attending: Obstetrics & Gynecology | Admitting: Obstetrics & Gynecology

## 2016-12-23 VITALS — BP 147/94 | HR 63 | Ht 64.0 in | Wt 264.2 lb

## 2016-12-23 DIAGNOSIS — N83202 Unspecified ovarian cyst, left side: Secondary | ICD-10-CM

## 2016-12-23 DIAGNOSIS — N870 Mild cervical dysplasia: Secondary | ICD-10-CM

## 2016-12-23 DIAGNOSIS — D069 Carcinoma in situ of cervix, unspecified: Secondary | ICD-10-CM

## 2016-12-23 LAB — POCT PREGNANCY, URINE: PREG TEST UR: NEGATIVE

## 2016-12-23 NOTE — Progress Notes (Signed)
US scheduled for January 31st @ 1000.  Pt notified.

## 2016-12-23 NOTE — Progress Notes (Signed)
   Subjective:    Patient ID: Carol Blair, female    DOB: 12/12/1990, 26 y.o.   MRN: [id number]  HPI 26 yo single P3 here for a colpo. [facility] H[facility].    Review of Systems She has a  cm left ovarian cyst seen on a recent ultrasound done for IUD placement. Her CA-125 was 16.     Objective:   Physical Exam  UPT negative, consent signed, time out done Cervix prepped with acetic acid. Transformation zone seen in its entirety. Colpo adequate. Relatively normal colpo findings. There were 2 areas (both biopsied) with a slightly irregular blood vessel). These biopsies were done at the 6 and 12 o'clock positions Silver nitrate used for hemostasis. ECC obtained. She tolerated the procedure well.    Assessment & Plan:  Left ovarian cyst- normal CA-125 Rec repeat u/s in late January HGSIL on pap- await pathology Recommend that she get the Gardasil series at the health dept

## 2017-01-04 ENCOUNTER — Encounter: Payer: Self-pay | Admitting: General Practice

## 2017-02-04 ENCOUNTER — Ambulatory Visit (HOSPITAL_COMMUNITY)
Admission: RE | Admit: 2017-02-04 | Discharge: 2017-02-04 | Disposition: A | Discharge status: Home / Self Care | Payer: Medicaid Other | Source: Ambulatory Visit | Attending: Obstetrics & Gynecology | Admitting: Obstetrics & Gynecology

## 2017-02-04 DIAGNOSIS — N83202 Unspecified ovarian cyst, left side: Secondary | ICD-10-CM | POA: Diagnosis present

## 2017-04-12 ENCOUNTER — Encounter: Payer: Self-pay | Admitting: *Deleted

## 2018-03-27 ENCOUNTER — Emergency Department (HOSPITAL_COMMUNITY): Payer: BLUE CROSS/BLUE SHIELD

## 2018-03-27 ENCOUNTER — Emergency Department (HOSPITAL_COMMUNITY)
Admission: EM | Admit: 2018-03-27 | Discharge: 2018-03-27 | Disposition: A | Discharge status: Home / Self Care | Payer: BLUE CROSS/BLUE SHIELD | Attending: Emergency Medicine | Admitting: Emergency Medicine

## 2018-03-27 ENCOUNTER — Encounter (HOSPITAL_COMMUNITY): Payer: Self-pay

## 2018-03-27 ENCOUNTER — Other Ambulatory Visit: Payer: Self-pay

## 2018-03-27 DIAGNOSIS — S99921A Unspecified injury of right foot, initial encounter: Secondary | ICD-10-CM | POA: Diagnosis not present

## 2018-03-27 DIAGNOSIS — S93601A Unspecified sprain of right foot, initial encounter: Secondary | ICD-10-CM | POA: Insufficient documentation

## 2018-03-27 DIAGNOSIS — I10 Essential (primary) hypertension: Secondary | ICD-10-CM | POA: Insufficient documentation

## 2018-03-27 DIAGNOSIS — Y929 Unspecified place or not applicable: Secondary | ICD-10-CM | POA: Diagnosis not present

## 2018-03-27 DIAGNOSIS — Z79899 Other long term (current) drug therapy: Secondary | ICD-10-CM | POA: Insufficient documentation

## 2018-03-27 DIAGNOSIS — Y939 Activity, unspecified: Secondary | ICD-10-CM | POA: Diagnosis not present

## 2018-03-27 DIAGNOSIS — X501XXA Overexertion from prolonged static or awkward postures, initial encounter: Secondary | ICD-10-CM | POA: Diagnosis not present

## 2018-03-27 DIAGNOSIS — Y999 Unspecified external cause status: Secondary | ICD-10-CM | POA: Diagnosis not present

## 2018-03-27 NOTE — ED Provider Notes (Signed)
[facility] EMERGENCY DEPARTMENT Provider Note   CSN: [id number] Arrival date & time: 03/27/18  1204    History   Chief Complaint Chief Complaint  Patient presents with  . Foot Injury    HPI Carol Blair is a 28 y.o. female.     28 year old female presents with complaint of right foot injury.  Patient states that she was moving boxes yesterday when she rolled the distal half of her foot and felt a crunch in her foot now with pain in the distal foot and unable to bear weight without pain.  No other injuries, complaints, concerns.     Past Medical History:  Diagnosis Date  . Benign essential hypertension antepartum 02/11/2011  . Hypertension   . Obesity   . Pregnancy induced hypertension     Patient Active Problem List   Diagnosis Date Noted  . Screen for STD (sexually transmitted disease) 03/10/2016  . Galactorrhea 05/10/2013  . ASCUS with positive high risk HPV 04/09/2011  . Hypertension 04/22/2010    Past Surgical History:  Procedure Laterality Date  . LAPAROSCOPIC CHOLECYSTECTOMY W/ CHOLANGIOGRAPHY  06/10/10   Dr Gaynelle Adu     OB History    Gravida  3   Para  3   Term  3   Preterm      AB      Living  3     SAB      TAB      Ectopic      Multiple      Live Births  3            Home Medications    Prior to Admission medications   Medication Sig Start Date End Date Taking? Authorizing Provider  acetaminophen (TYLENOL) 500 MG tablet Take 1,000 mg by mouth every 6 (six) hours as needed for headache.    [provider]  cetirizine (ZYRTEC) 10 MG tablet Take 10 mg by mouth daily as needed for allergies. Reported on 07/03/2015    [provider]  ibuprofen (ADVIL,MOTRIN) 600 MG tablet Take 1 tablet (600 mg total) by mouth every 6 (six) hours. Patient not taking: Reported on 12/23/2016 12/30/15   Constant, Peggy, MD  lisinopril (PRINIVIL,ZESTRIL) 10 MG tablet Take 1 tablet (10 mg total) by mouth daily.  11/04/16   Marny Lowenstein, PA-C    Family History Family History  Problem Relation Age of Onset  . Hypertension Sister   . Kidney disease Sister   . Cancer Maternal Grandmother        GI  . Breast cancer Maternal Aunt   . Cancer Maternal Uncle        colon    Social History Social History   Tobacco Use  . Smoking status: Never Smoker  . Smokeless tobacco: Never Used  Substance Use Topics  . Alcohol use: No  . Drug use: No     Allergies   Codeine   Review of Systems Review of Systems  Constitutional: Negative for fever.  Musculoskeletal: Positive for arthralgias, gait problem and myalgias.  Skin: Negative for color change, rash and wound.  Allergic/Immunologic: Negative for immunocompromised state.  Neurological: Negative for weakness and numbness.  Hematological: Negative for adenopathy.  All other systems reviewed and are negative.    Physical Exam Updated Vital Signs BP (!) 179/97 (BP Location: Right Arm)   Pulse 65   Temp 97.9 F (36.6 C) (Oral)   Resp 16   LMP 02/07/2018 Comment: spots  monthly   SpO2 100%   Physical Exam Vitals signs and nursing note reviewed.  Constitutional:      General: She is not in acute distress.    Appearance: She is well-developed. She is not diaphoretic.  HENT:     Head: Normocephalic and atraumatic.  Pulmonary:     Effort: Pulmonary effort is normal.  Musculoskeletal:        General: Swelling and tenderness present. No deformity.     Right ankle: No tenderness. No lateral malleolus, no medial malleolus, no head of 5th metatarsal and no proximal fibula tenderness found.     Right foot: Decreased range of motion. Normal capillary refill. Bony tenderness and swelling present. No crepitus, deformity or laceration.       Feet:     Comments: TTP mid to distal foot, NVI  Skin:    General: Skin is warm and dry.     Capillary Refill: Capillary refill takes less than 2 seconds.     Findings: No erythema or rash.   Neurological:     Mental Status: She is alert and oriented to person, place, and time.  Psychiatric:        Behavior: Behavior normal.      ED Treatments / Results  Labs (all labs ordered are listed, but only abnormal results are displayed) Labs Reviewed - No data to display  EKG None  Radiology Dg Foot Complete Right  Result Date: 03/27/2018 CLINICAL DATA:  Injury EXAM: RIGHT FOOT COMPLETE - 3+ VIEW COMPARISON:  None. FINDINGS: There is no evidence of fracture or dislocation. There is no evidence of arthropathy or other focal bone abnormality. Soft tissues are unremarkable. IMPRESSION: Negative. Electronically Signed   By: Bary Richard M.D.   On: 03/27/2018 13:40    Procedures Procedures (including critical care time)  Medications Ordered in ED Medications - No data to display   Initial Impression / Assessment and Plan / ED Course  I have reviewed the triage vital signs and the nursing notes.  Pertinent labs & imaging results that were available during my care of the patient were reviewed by me and considered in my medical decision making (see chart for details).  Clinical Course as of Mar 27 1350  Sun Mar 27, 2018  1346 27yo female with right foot injury, rolled her foot yesterday, pain in mid to distal foot, worse with bearing weight. On exam, mild swelling, TTP distal to mid foot, NVI. XR negative for fx/bony injury. Given post op shoe to wear, may use her crutches to weight bear as tolerated, ice/elevate/motrin/tylenol. Recheck with PCP in 1 week if pain continues.    [LM]    Clinical Course User Index [LM] Jeannie Fend, PA-C   Final Clinical Impressions(s) / ED Diagnoses   Final diagnoses:  Sprain of right foot, initial encounter    ED Discharge Orders    None       Jeannie Fend, PA-C 03/27/18 1352    Margarita Grizzle, MD 03/27/18 1421

## 2018-03-27 NOTE — ED Triage Notes (Signed)
Onset yesterday pt walking down steps, right foot turned down and pt heard "crunch".  Is unable to bear full weight, can put heel to floor and limp.

## 2018-03-27 NOTE — ED Notes (Signed)
Patient verbalizes understanding of discharge instructions . Opportunity for questions and answers were provided . Armband removed by staff ,Pt discharged from ED. W/C  offered at D/C  and Declined W/C at D/C and was escorted to lobby by RN.  

## 2018-03-27 NOTE — Discharge Instructions (Addendum)
Apply ice to area for 20 minutes at a time, elevate for pain and swelling. Take Motrin/Tylenol as needed as directed. Weight bear as tolerated. Follow up with your doctor in 1 week if pain persists.

## 2018-07-25 ENCOUNTER — Other Ambulatory Visit: Payer: Self-pay

## 2018-07-25 ENCOUNTER — Other Ambulatory Visit (HOSPITAL_COMMUNITY)
Admission: RE | Admit: 2018-07-25 | Discharge: 2018-07-25 | Disposition: A | Discharge status: Home / Self Care | Payer: BC Managed Care – PPO | Source: Ambulatory Visit | Attending: Family Medicine | Admitting: Family Medicine

## 2018-07-25 ENCOUNTER — Ambulatory Visit (INDEPENDENT_AMBULATORY_CARE_PROVIDER_SITE_OTHER): Payer: BC Managed Care – PPO | Admitting: Family Medicine

## 2018-07-25 ENCOUNTER — Other Ambulatory Visit: Payer: Self-pay | Admitting: Oral Surgery

## 2018-07-25 ENCOUNTER — Encounter: Payer: Self-pay | Admitting: Family Medicine

## 2018-07-25 VITALS — BP 180/98 | HR 103

## 2018-07-25 DIAGNOSIS — R87810 Cervical high risk human papillomavirus (HPV) DNA test positive: Secondary | ICD-10-CM | POA: Diagnosis not present

## 2018-07-25 DIAGNOSIS — Z30432 Encounter for removal of intrauterine contraceptive device: Secondary | ICD-10-CM | POA: Diagnosis not present

## 2018-07-25 DIAGNOSIS — B379 Candidiasis, unspecified: Secondary | ICD-10-CM | POA: Diagnosis not present

## 2018-07-25 DIAGNOSIS — I10 Essential (primary) hypertension: Secondary | ICD-10-CM | POA: Diagnosis not present

## 2018-07-25 DIAGNOSIS — Z8742 Personal history of other diseases of the female genital tract: Secondary | ICD-10-CM

## 2018-07-25 DIAGNOSIS — Z01810 Encounter for preprocedural cardiovascular examination: Secondary | ICD-10-CM | POA: Insufficient documentation

## 2018-07-25 DIAGNOSIS — Z1151 Encounter for screening for human papillomavirus (HPV): Secondary | ICD-10-CM | POA: Diagnosis not present

## 2018-07-25 DIAGNOSIS — M2740 Unspecified cyst of jaw: Secondary | ICD-10-CM

## 2018-07-25 DIAGNOSIS — Z3009 Encounter for other general counseling and advice on contraception: Secondary | ICD-10-CM | POA: Diagnosis not present

## 2018-07-25 DIAGNOSIS — Z0000 Encounter for general adult medical examination without abnormal findings: Secondary | ICD-10-CM | POA: Insufficient documentation

## 2018-07-25 MED ORDER — NORETHINDRONE 0.35 MG PO TABS: 1.0000 | ORAL_TABLET | Freq: Every day | ORAL | 11 refills | Status: DC

## 2018-07-25 MED ORDER — AMLODIPINE BESYLATE 10 MG PO TABS: 10.0000 mg | ORAL_TABLET | Freq: Every day | ORAL | 3 refills | Status: DC

## 2018-07-25 NOTE — Assessment & Plan Note (Signed)
Due for Pap- cotesting completed today given history of abnormal and current recommendations.

## 2018-07-25 NOTE — Assessment & Plan Note (Signed)
IUD removed without issue today. Discussed options, patient would like another method in addition to husband's vasectomy.  Micronor prescribed.

## 2018-07-25 NOTE — Assessment & Plan Note (Signed)
Started Amlodipine 10 mg. Would consider indapamide at follow up. BMP today. Follow up in 1 week with Dr. Valentina Lucks for ambulatory cuff and additional age.

## 2018-07-25 NOTE — Progress Notes (Signed)
  Patient Name: Carol Blair Date of Birth: 04-05-1990 Date of Visit: 07/25/18 PCP: Martyn Malay, MD  Chief Complaint: HTN and IUD issues   Subjective: Carol Blair is a pleasant 28 y.o. with medical history significant for hypertension and obesity  presenting today for multiple concerns.   HTN The patient has had hypertension in each of her pregnancies (3X) requiring medication only in her G3 pregnancy. No increase in headaches, denies chest pain or dyspnea. No LE edema. She was previously on a CCB and then an ACE inhibitor.   The patient does not use any herbal supplements or over-the-counter medication and she does not use NSAIDs regularly.  She reports her sister has a history of what was thought to be lupus nephritis is now on dialysis therapy.  Contraception The patient reports she has had an IUD since the birth of her last child. Her husband has a vasectomy. She likes her period control with the IUD but has pain during and after intercourse. She would like her IUD removed. She has thought a lot about this.   Intermittent Headaches Patient has a history of tension type headaches. Occur 1-2 times per month. No change in symptoms. No morning headaches. No nighttime headaches. No associated neurologic symptoms. Takes at most 1 Excedrin per week.   ROS: Per HPI.   I have reviewed the patient's medical, surgical, family, and social history as appropriate.  Vitals:   07/25/18 1058  BP: (!) 180/98  Pulse: (!) 103  SpO2: 99%   HEENT: Sclera anicteric. Dentition is moderate. Appears well hydrated. Neck: Supple Cardiac: Regular rate and rhythm. Normal S1/S2. No murmurs, rubs, or gallops appreciated. Lungs: Clear bilaterally to ascultation.  Abdomen: Normoactive bowel sounds. No tenderness to deep or light palpation. No rebound or guarding.  Extremities: Warm, well perfused without edema.  Skin: Warm, dry Psych: Pleasant and appropriate  GU Exam:  Chaperoned exam.  External  exam: Normal-appearing female external genitalia.  Vaginal exam notable for normal discharge.  Cervix without discharge or obvious lesion. IUD strings present. IUD strings easily grasped with ringed forceps and removed. Patient tolerated procedure well.   Hypertension Started Amlodipine 10 mg. Would consider indapamide at follow up. BMP today. Follow up in 1 week with Dr. Valentina Lucks for ambulatory cuff and additional age.  -Secondary causes of hypertension must be considered, consider EKG at follow-up.  Also consider echocardiogram.  If requires more than 2 agents will refer to cardiology.  ASCUS with positive high risk HPV Due for Pap- cotesting completed today given history of abnormal and current recommendations.   Contraceptive management IUD removed without issue today. Discussed options, patient would like another method in addition to husband's vasectomy.  Micronor prescribed.    Return to care in 1 week for BP check with Dr. Valentina Lucks.   Dorris Singh, MD  [facility] Medicine Teaching Service

## 2018-07-25 NOTE — Patient Instructions (Addendum)
Schedule follow up in 1 week with Dr. Valentina Lucks for a blood  Pressure check  Schedule follow up with me in 1-2 months to check in and discuss headaches.  It was wonderful to see you today.  Thank you for choosing [facility].   Please call [phone] with any questions about today's appointment.  Please be sure to schedule follow up at the front  desk before you leave today.   Dorris Singh, MD  Family Medicine    Take your  Amlodipine at night

## 2018-07-26 LAB — BASIC METABOLIC PANEL
BUN/Creatinine Ratio: 10 (ref 9–23)
BUN: 9 mg/dL (ref 6–20)
CO2: 21 mmol/L (ref 20–29)
Calcium: 9.3 mg/dL (ref 8.7–10.2)
Chloride: 102 mmol/L (ref 96–106)
Creatinine, Ser: 0.86 mg/dL (ref 0.57–1.00)
GFR calc Af Amer: 106 mL/min/{1.73_m2} (ref >59)
GFR calc non Af Amer: 92 mL/min/{1.73_m2} (ref >59)
Glucose: 140 mg/dL — ABNORMAL HIGH (ref 65–99)
Potassium: 4 mmol/L (ref 3.5–5.2)
Sodium: 139 mmol/L (ref 134–144)

## 2018-07-27 ENCOUNTER — Telehealth: Payer: Self-pay | Admitting: Family Medicine

## 2018-07-27 LAB — CYTOLOGY - PAP
Diagnosis: NEGATIVE
HPV 16/18/45 genotyping: NEGATIVE
HPV: DETECTED — AB

## 2018-07-27 MED ORDER — FLUCONAZOLE 150 MG PO TABS: 150.0000 mg | ORAL_TABLET | Freq: Once | ORAL | 0 refills | Status: AC

## 2018-07-27 NOTE — Telephone Encounter (Signed)
Called patient to discuss results of Pap smear at length.  The patient had a history of HSILon Pap on 12/08/16 -> CIN1 on Colposcopy. Pap 18 months later was NILM HPV + non-16, non 18.   Discussed at length with Dr. Nori Riis. Reviewed most recent recommendations. Offered and discuss colposcopy vs. Repeat cotesting 1 year. Patient would very much like to avoid colposcopy. Repeat in 1 year. Reminder sent to self. Reviewed risks and discussed guidelines at length.  Dorris Singh, MD  Family Medicine Teaching Service

## 2018-08-03 ENCOUNTER — Ambulatory Visit (HOSPITAL_COMMUNITY): Payer: BC Managed Care – PPO

## 2018-08-09 ENCOUNTER — Ambulatory Visit (INDEPENDENT_AMBULATORY_CARE_PROVIDER_SITE_OTHER): Payer: BC Managed Care – PPO | Admitting: Pharmacist

## 2018-08-09 ENCOUNTER — Other Ambulatory Visit: Payer: Self-pay

## 2018-08-09 DIAGNOSIS — I10 Essential (primary) hypertension: Secondary | ICD-10-CM | POA: Diagnosis not present

## 2018-08-09 LAB — POCT CBG (FASTING - GLUCOSE)-MANUAL ENTRY: Glucose Fasting, POC: 86 mg/dL (ref 70–99)

## 2018-08-09 NOTE — Patient Instructions (Addendum)
Thank you for participating in the the blood pressure monitor exam. There will be no change in medication therapy.  Encourage to eat a low-salt diet, weight loss, and increase exercise to 150 minutes per week (at least 30 minutes 2-3 days a week).

## 2018-08-10 ENCOUNTER — Other Ambulatory Visit: Payer: Self-pay | Admitting: Oral Surgery

## 2018-08-10 ENCOUNTER — Encounter: Payer: Self-pay | Admitting: Pharmacist

## 2018-08-10 ENCOUNTER — Encounter (HOSPITAL_COMMUNITY): Payer: Self-pay

## 2018-08-10 ENCOUNTER — Ambulatory Visit (HOSPITAL_COMMUNITY): Admission: RE | Admit: 2018-08-10 | Payer: BC Managed Care – PPO | Source: Ambulatory Visit

## 2018-08-10 ENCOUNTER — Ambulatory Visit (HOSPITAL_COMMUNITY)
Admission: RE | Admit: 2018-08-10 | Discharge: 2018-08-10 | Disposition: A | Discharge status: Home / Self Care | Payer: BC Managed Care – PPO | Source: Ambulatory Visit | Attending: Oral Surgery | Admitting: Oral Surgery

## 2018-08-10 DIAGNOSIS — M2740 Unspecified cyst of jaw: Secondary | ICD-10-CM | POA: Diagnosis not present

## 2018-08-10 DIAGNOSIS — M899 Disorder of bone, unspecified: Secondary | ICD-10-CM | POA: Diagnosis not present

## 2018-08-10 MED ORDER — IOHEXOL 300 MG/ML  SOLN
100.0000 mL | Freq: Once | INTRAMUSCULAR | Status: AC | PRN
  Administered 2018-08-10: 100 mL via INTRAVENOUS

## 2018-08-10 NOTE — Assessment & Plan Note (Signed)
History of hypertension during each of her pregnancies (3X).  24-hour ambulatory blood pressure demonstrates an average blood pressure of 136/77 mmHg, and a nocturnal dipping pattern that is normal.   - No changes to current medication therapy, continue Amlodipine 10 mg daily. Discussed benefits of low-salt diet, weight loss, and increased exercise.

## 2018-08-10 NOTE — Progress Notes (Signed)
Patient ID: Carol Blair, female   DOB: Dec 30, 1990, 28 y.o.   MRN: [id number] Reviewed: I agree with Dr. Graylin Shiver documentation and management.

## 2018-08-10 NOTE — Progress Notes (Signed)
   S:    Patient arrives in good spirits, ambulating without assistance.    Presents to the clinic for ambulatory blood pressure evaluation.   Patient was referred and last seen by Primary Care Provider, Dr. Dorris Singh on 07/25/18.   Pregnancy induced hypertension since 2012.  Medication compliance is reported to be adherent.  Discussed procedure for wearing the monitor and gave patient written instructions. Monitor was placed on non-dominant arm with instructions to return in the morning.   Current BP Medications include: Amlodipine 10 mg daily  Antihypertensives tried in the past include: Previously on a CCB and then Lisinopril.    O:  Physical Exam Neurological:     Mental Status: She is alert.  Psychiatric:        Mood and Affect: Mood normal.        Behavior: Behavior normal.    Review of Systems  All other systems reviewed and are negative.   Last 3 Office BP readings: BP Readings from Last 3 Encounters:  07/25/18 (!) 180/98  03/27/18 (!) 179/97  12/23/16 (!) 147/94     Today's Office BP reading: 132/84 mmHg (manual reading)  ABPM Study Data: Arm Placement left arm   Overall Mean 24hr BP:   136/77 mmHg HR: 71  Daytime Mean BP:  145/86 mmHg HR: 88  Nighttime Mean BP:  119/61 mmHg HR: 64  Dipping Pattern: Yes.    Sys:   18%   Dia: 29%   [normal dipping ~10-20%]   Non-hypertensive ABPM thresholds: daytime BP <125/75 mmHg, sleeptime BP <120/70 mmHg   BMET    Component Value Date/Time   NA 139 07/25/2018 1141   K 4.0 07/25/2018 1141   CL 102 07/25/2018 1141   CO2 21 07/25/2018 1141   GLUCOSE 140 (H) 07/25/2018 1141   GLUCOSE 85 12/30/2015 0521   GLUCOSE 80 12/25/2009 1332   BUN 9 07/25/2018 1141   CREATININE 0.86 07/25/2018 1141   CREATININE 0.61 11/22/2015 1103   CALCIUM 9.3 07/25/2018 1141   GFRNONAA 92 07/25/2018 1141   GFRAA 106 07/25/2018 1141    A/P:  History of hypertension during each of her pregnancies (3X).  24-hour ambulatory blood  pressure demonstrates an average blood pressure of 136/77 mmHg, and a nocturnal dipping pattern that is normal.   - No changes to current medication therapy, continue Amlodipine 10 mg daily. Discussed benefits of low-salt diet, weight loss, and increased exercise.    Results reviewed and written information provided.  Total time in face-to-face counseling 15 minutes.   F/U Clinic Visit with Dr. Dorris Singh.  Patient seen with Murlean Iba, PharmD Candidate and Lorel Monaco, PharmD PGY-1 Resident.

## 2018-08-29 ENCOUNTER — Other Ambulatory Visit: Payer: Self-pay

## 2018-08-29 ENCOUNTER — Ambulatory Visit (INDEPENDENT_AMBULATORY_CARE_PROVIDER_SITE_OTHER): Payer: BC Managed Care – PPO | Admitting: Family Medicine

## 2018-08-29 ENCOUNTER — Encounter: Payer: Self-pay | Admitting: Family Medicine

## 2018-08-29 ENCOUNTER — Ambulatory Visit (HOSPITAL_COMMUNITY)
Admission: RE | Admit: 2018-08-29 | Discharge: 2018-08-29 | Disposition: A | Discharge status: Home / Self Care | Payer: BC Managed Care – PPO | Source: Ambulatory Visit | Attending: Family Medicine | Admitting: Family Medicine

## 2018-08-29 VITALS — BP 130/80 | HR 75 | Wt 275.6 lb

## 2018-08-29 DIAGNOSIS — Z79899 Other long term (current) drug therapy: Secondary | ICD-10-CM | POA: Insufficient documentation

## 2018-08-29 DIAGNOSIS — E669 Obesity, unspecified: Secondary | ICD-10-CM | POA: Insufficient documentation

## 2018-08-29 DIAGNOSIS — I10 Essential (primary) hypertension: Secondary | ICD-10-CM | POA: Insufficient documentation

## 2018-08-29 DIAGNOSIS — Z01810 Encounter for preprocedural cardiovascular examination: Secondary | ICD-10-CM

## 2018-08-29 DIAGNOSIS — F39 Unspecified mood [affective] disorder: Secondary | ICD-10-CM | POA: Insufficient documentation

## 2018-08-29 DIAGNOSIS — G44229 Chronic tension-type headache, not intractable: Secondary | ICD-10-CM

## 2018-08-29 DIAGNOSIS — R519 Headache, unspecified: Secondary | ICD-10-CM | POA: Insufficient documentation

## 2018-08-29 DIAGNOSIS — R51 Headache: Secondary | ICD-10-CM | POA: Diagnosis not present

## 2018-08-29 NOTE — Assessment & Plan Note (Signed)
Mild chronic headaches with no red flag symptoms.  These are unchanged from prior.  Most likely this represents a tension type headache.  Recommended she undergo an eye examination which is covered by her insurance.  Stop bang score is 2 although sleep apnea could be considered.  Not consistent with migraine headache.  No red flags that would be consistent with intracranial malignancy.

## 2018-08-29 NOTE — Assessment & Plan Note (Signed)
The patient is low risk for low risk surgery.  EKG reviewed.  Mild left atrial enlargement although not markedly different from prior.  No left ventricular hypertrophy..  No abnormalities.  She can achieve greater than 4 METS.  The [place] Safe calculator places her at a low risk of perioperative myocardial infarction or cardiac events.  Will obtain CBC today to complete preoperative evaluation.  Pending results the patient is medically optimized for surgery.  I recommend she continue her antihypertensive on the morning of her surgery.  Typically takes this at night.

## 2018-08-29 NOTE — Patient Instructions (Addendum)
  I will fill out the paperwork for your employer.  It was great to see you today.  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  Your blood pressure looks excellent.    I will call you with results of your blood.     It was wonderful to see you today.  Thank you for choosing [facility].   Please call [phone] with any questions about today's appointment.  Please be sure to schedule follow up at the front  desk before you leave today.   Dorris Singh, MD  Family Medicine

## 2018-08-29 NOTE — Progress Notes (Signed)
Patient Name: Carol Blair Date of Birth: 08/27/1990 Date of Visit: 08/29/18 PCP: Carol Blair, Carol Savoca M, MD  Chief Complaint: paperwork, dental questions   Subjective: Carol Blair is a pleasant 28 y.o. with medical history significant for obesity and hypertension presenting today for a number of concerns.  Headaches The patient reports ongoing intermittent headaches.  These are bilateral and frontal in nature.  These are similar to prior headaches.  She reports she thinks this is due to stress.  She currently works a full-time job for [employer].  She has 3 children all of whom are school-age.  She is now her Runner, broadcasting/film/videoteacher.  She additionally takes care of her sister who is on dialysis. She denies headaches which awaken her from sleep, speech changes, vision changes weakness or numbness in her upper or lower extremities.  The pain is a 2 out of 10 at worst.  She takes Tylenol 1-2 times per week.   Hypertension She is taking amlodipine without issue.  She denies side effects.  Denies chest pain or difficulty breathing.  She feels overall well.  Preoperative evaluation The patient is to undergo excision and bone graft with her dentist Dr. Barbette Blair.  She is uncertain the date of the surgery.  She is able to walk up 2 flights of stairs and around several city blocks without issue.  Review of remainder of preoperative symptoms as below.  Review of systems: Chest pain with exertion? No Dyspnea on exertion? No Can you walk 2 blocks without dyspnea or chest pain? Yes Can you walk up a flight of stairs without chest pain or dyspnea? Yes Any history of easy bruising or bleeding? No Any family history of bleeding diathesis? No Any personal or family of difficulty with anesthesia? No Any history of sleep apnea? No  Medical History: Cardiac conditions? No History of VTE? Sister- has ESRD due to 'renal disease' History of adverse surgical outcome? No  Diabetes? No Obesity? Yes OSA? No  STOP BANG is 2     ROS: Per HPI.   I have reviewed the patient's medical, surgical, family, and social history as appropriate.  Vitals:   08/29/18 0852  BP: 130/80  Pulse: 75  SpO2: 99%   HEENT: Sclera anicteric. Dentition is moderate. Appears well hydrated. Neck: Supple Cardiac: Regular rate and rhythm. Normal S1/S2. No murmurs, rubs, or gallops appreciated. Lungs: Clear bilaterally to ascultation.  Abdomen: Normoactive bowel sounds. No tenderness to deep or light palpation. No rebound or guarding.  Extremities: Warm, well perfused without edema.  Skin: Warm, dry Psych: Pleasant and appropriate     Preop cardiovascular exam The patient is low risk for low risk surgery.  EKG reviewed.  Mild left atrial enlargement although not markedly different from prior.  No left ventricular hypertrophy..  No abnormalities.  She can achieve greater than 4 METS.  The Carol Blair calculator places her at a low risk of perioperative myocardial infarction or cardiac events.  Will obtain CBC today to complete preoperative evaluation.  Pending results the patient is medically optimized for surgery.  I recommend she continue her antihypertensive on the morning of her surgery.  Typically takes this at night.  Chronic headaches Mild chronic headaches with no red flag symptoms.  These are unchanged from prior.  Most likely this represents a tension type headache.  Recommended she undergo an eye examination which is covered by her insurance.  Stop bang score is 2 although sleep apnea could be considered.  Not consistent with migraine headache.  No  red flags that would be consistent with intracranial malignancy.  Change in Mood, due to stress at work and number of other stressors in her life.  Provided supportive listening.  She has no thoughts of hurting herself or others.  She denies a low mood.  She still looks forward to getting up every single day.  She does report difficulty sleeping at times.  Paperwork the patient has requested  paperwork be filled out for her medical appointments related to hypertension and dental work.  She is requesting FMLA paperwork from her sisters clinician as she cares for her sister.  Return to care in 1 month for a check in virtually.   Carol Singh, MD  Family Medicine Teaching Service

## 2018-08-30 LAB — CBC
Hematocrit: 38.6 % (ref 34.0–46.6)
Hemoglobin: 13 g/dL (ref 11.1–15.9)
MCH: 30.7 pg (ref 26.6–33.0)
MCHC: 33.7 g/dL (ref 31.5–35.7)
MCV: 91 fL (ref 79–97)
Platelets: 286 10*3/uL (ref 150–450)
RBC: 4.24 x10E6/uL (ref 3.77–5.28)
RDW: 12.1 % (ref 11.7–15.4)
WBC: 5.6 10*3/uL (ref 3.4–10.8)

## 2018-09-05 ENCOUNTER — Other Ambulatory Visit: Payer: Self-pay

## 2018-09-05 ENCOUNTER — Encounter: Payer: Self-pay | Admitting: Family Medicine

## 2018-09-05 ENCOUNTER — Ambulatory Visit (INDEPENDENT_AMBULATORY_CARE_PROVIDER_SITE_OTHER): Payer: BC Managed Care – PPO | Admitting: Family Medicine

## 2018-09-05 ENCOUNTER — Ambulatory Visit: Payer: BC Managed Care – PPO | Admitting: Family Medicine

## 2018-09-05 DIAGNOSIS — I10 Essential (primary) hypertension: Secondary | ICD-10-CM

## 2018-09-05 NOTE — Assessment & Plan Note (Addendum)
Blood pressure normotensive today. Endorses compliance without any medication side effects.  - Continue Amlodipine 10mg  qHS - Discussed benefits of low-salt diet, weight loss, and increased exercise. Patient to aim for 30 mins of exercise 4x/week. - Follow up 1 month with PCP to ensure continued adequate control, sooner if problems  - as discussed previously, can consider addition of Indapamide if indicated

## 2018-09-05 NOTE — Progress Notes (Signed)
   Subjective:   Patient ID: Carol Blair    DOB: Jan 05, 1991, 28 y.o. female   MRN: [id number]  Carol Blair is a 28 y.o. female here for blood pressure check.  HTN: Patient here today for BP recheck. History of elevated BP's during pregnancy that would resolve postpartum. However, was noted to have elevated BP during recent visits. Completed a 24-hr ambulatory blood pressure study. BP today: 127/80. She is currently on Amlodipine 10mg  qHS. Endorses compliance. Denies any side effects. Has not been checking BP at home. Denies chest pain, SOB, LE swelling, change in headaches, or vision changes. Patient is working on weight loss and has been doing some exercise with her 18 year old daughter. She endorses doing 30-min Zo[place]a 2-3 times per week. .She is also attempting low salt diet.   Review of Systems:  Per HPI.   [place], medications and smoking status reviewed.  Objective:   BP 127/80   Pulse 75   Temp 98.5 F (36.9 C) (Axillary)   Wt 275 lb 9.6 oz (125 kg)   LMP 08/15/2018 (Approximate)   SpO2 100%   BMI 45.86 kg/m  Vitals and nursing note reviewed.  General: well nourished, well developed, in no acute distress with non-toxic appearance, sitting comfortably in exam chair CV: regular rate and rhythm without murmurs, rubs, or gallops, no lower extremity edema Lungs: clear to auscultation bilaterally with normal work of breathing Skin: warm, dry Extremities: warm and well perfused MSK:  gait normal Neuro: Alert and oriented, speech normal  Assessment & Plan:   Hypertension Blood pressure normotensive today. Endorses compliance without any medication side effects.  - Continue Amlodipine 10mg  qHS - Discussed benefits of low-salt diet, weight loss, and increased exercise. Patient to aim for 30 mins of exercise 4x/week. - Follow up 1 month with PCP to ensure continued adequate control, sooner if problems  - as discussed previously, can consider addition of Indapamide if indicated     Mina Marble, DO PGY-2, [facility] 09/05/2018 10:08 PM

## 2018-10-07 ENCOUNTER — Other Ambulatory Visit: Payer: Self-pay

## 2018-10-07 ENCOUNTER — Encounter: Payer: Self-pay | Admitting: Family Medicine

## 2018-10-07 ENCOUNTER — Telehealth (INDEPENDENT_AMBULATORY_CARE_PROVIDER_SITE_OTHER): Payer: BC Managed Care – PPO | Admitting: Family Medicine

## 2018-10-07 DIAGNOSIS — I10 Essential (primary) hypertension: Secondary | ICD-10-CM

## 2018-10-07 DIAGNOSIS — G44229 Chronic tension-type headache, not intractable: Secondary | ICD-10-CM

## 2018-10-07 NOTE — Progress Notes (Signed)
[facility] Telemedicine Visit  Patient consented to have virtual visit. Method of visit: Telephone  Encounter participants: Patient: Carol Blair - located at car  Provider: Martyn Malay - located at [facility] Others (if applicable):   Chief Complaint: check   HPI:  Carol Blair is a pleasant 28 year old woman with history significant for obesity and hypertension presenting via telemedicine visit for a blood pressure check.  She is currently driving and would like to defer appointment till later time.  She reports her blood pressure has been well controlled.  She denies any side effects of medications.  Her back pain is improved.  Her headaches have improved as well.  She reports overall level of stress is improved and she is feeling well.  She would like to call to check in a few weeks.  In person appointment.  ROS: per HPI  Pertinent PMHx:  Obesity HTN   Exam:  Respiratory: In full complete sentences.  Assessment/Plan:  Hypertension  Patient reports no symptoms from her blood pressure pills at this time.  She will check her blood pressure at home.  Will call to schedule in a few weeks time.  Time spent during visit with patient: 3 minutes

## 2018-11-17 ENCOUNTER — Other Ambulatory Visit: Payer: Self-pay

## 2018-11-17 ENCOUNTER — Ambulatory Visit (INDEPENDENT_AMBULATORY_CARE_PROVIDER_SITE_OTHER): Payer: BC Managed Care – PPO | Admitting: Family Medicine

## 2018-11-17 ENCOUNTER — Encounter: Payer: Self-pay | Admitting: Family Medicine

## 2018-11-17 VITALS — BP 145/80 | HR 79 | Wt 274.8 lb

## 2018-11-17 DIAGNOSIS — I10 Essential (primary) hypertension: Secondary | ICD-10-CM | POA: Diagnosis not present

## 2018-11-17 DIAGNOSIS — N926 Irregular menstruation, unspecified: Secondary | ICD-10-CM

## 2018-11-17 LAB — POCT URINE PREGNANCY: Preg Test, Ur: NEGATIVE

## 2018-11-17 NOTE — Progress Notes (Signed)
  Patient Name: Carol Blair Date of Birth: 01-21-1990 Date of Visit: 11/17/18 PCP: Martyn Malay, MD  Chief Complaint: missed menses   Subjective: Carol Blair is a pleasant 28 y.o. with medical history significant for essential hypertnesion presenting today for missed menses. LMP is  10/10/18. Patient's partner has vasectomy. Patient currently takes Micronor. Periods prior to this were regular X3 months after Mirena removal. Has some mild cramping. No nausea, vomiting, or breast tenderness.  Patient did not take antihypertensives medications today. Denies HA, chest pain.  ROS: Per HPI.   I have reviewed the patient's medical, surgical, family, and social history as appropriate.  Vitals:   11/17/18 0902  BP: (!) 145/80  Pulse: 79  SpO2: 100%   HEENT: Sclera anicteric. Dentition is moderate. Appears well hydrated. Cardiac: Regular rate and rhythm. Normal S1/S2. No murmurs, rubs, or gallops appreciated. Lungs: Clear bilaterally to ascultation.   POC Pregnancy negative    Hypertension Needs to pick up refill on medications. Did not take antihypertensives X2 days. She is pick up today.   Missed Menses, likely due to micronor and transition from [place]. Less likely causes include thyroid disease, pregnancy (will confirm with serum). If persists, consider trial off Micro  Return to care in 2-3 months, needs Pap next year .   Dorris Singh, MD  Family Medicine Teaching Service

## 2018-11-17 NOTE — Assessment & Plan Note (Signed)
Needs to pick up refill on medications. Did not take antihypertensives X2 days. She is pick up today.

## 2018-11-17 NOTE — Patient Instructions (Signed)
I will call you with results    It was wonderful to see you today.  Thank you for choosing [facility].   Please call [phone] with any questions about today's appointment.  Please be sure to schedule follow up at the front  desk before you leave today.   Dorris Singh, MD  Family Medicine

## 2018-11-18 LAB — HCG, SERUM, QUALITATIVE: hCG,Beta Subunit,Qual,Serum: NEGATIVE m[IU]/mL (ref <6)

## 2019-08-22 ENCOUNTER — Other Ambulatory Visit: Payer: Self-pay | Admitting: *Deleted

## 2019-08-22 DIAGNOSIS — Z3009 Encounter for other general counseling and advice on contraception: Secondary | ICD-10-CM

## 2019-08-22 MED ORDER — NORETHINDRONE 0.35 MG PO TABS: 1.0000 | ORAL_TABLET | Freq: Every day | ORAL | 1 refills | Status: DC

## 2019-08-28 ENCOUNTER — Ambulatory Visit (INDEPENDENT_AMBULATORY_CARE_PROVIDER_SITE_OTHER): Payer: Medicaid Other | Admitting: Student in an Organized Health Care Education/Training Program

## 2019-08-28 ENCOUNTER — Other Ambulatory Visit: Payer: Self-pay

## 2019-08-28 ENCOUNTER — Other Ambulatory Visit: Payer: Self-pay | Admitting: Student in an Organized Health Care Education/Training Program

## 2019-08-28 ENCOUNTER — Encounter (HOSPITAL_COMMUNITY): Payer: Self-pay | Admitting: Obstetrics and Gynecology

## 2019-08-28 ENCOUNTER — Inpatient Hospital Stay (HOSPITAL_COMMUNITY): Payer: Medicaid Other

## 2019-08-28 ENCOUNTER — Inpatient Hospital Stay (HOSPITAL_COMMUNITY)
Admission: AD | Admit: 2019-08-28 | Discharge: 2019-08-28 | Disposition: A | Discharge status: Home / Self Care | Payer: Medicaid Other | Attending: Obstetrics and Gynecology | Admitting: Obstetrics and Gynecology

## 2019-08-28 VITALS — BP 164/98 | HR 89 | Wt 278.0 lb

## 2019-08-28 DIAGNOSIS — Z3A01 Less than 8 weeks gestation of pregnancy: Secondary | ICD-10-CM | POA: Insufficient documentation

## 2019-08-28 DIAGNOSIS — Z8249 Family history of ischemic heart disease and other diseases of the circulatory system: Secondary | ICD-10-CM | POA: Insufficient documentation

## 2019-08-28 DIAGNOSIS — O99211 Obesity complicating pregnancy, first trimester: Secondary | ICD-10-CM | POA: Diagnosis not present

## 2019-08-28 DIAGNOSIS — Z3A00 Weeks of gestation of pregnancy not specified: Secondary | ICD-10-CM | POA: Diagnosis not present

## 2019-08-28 DIAGNOSIS — O10011 Pre-existing essential hypertension complicating pregnancy, first trimester: Secondary | ICD-10-CM

## 2019-08-28 DIAGNOSIS — O26851 Spotting complicating pregnancy, first trimester: Secondary | ICD-10-CM

## 2019-08-28 DIAGNOSIS — Z3200 Encounter for pregnancy test, result unknown: Secondary | ICD-10-CM

## 2019-08-28 DIAGNOSIS — E669 Obesity, unspecified: Secondary | ICD-10-CM | POA: Diagnosis not present

## 2019-08-28 DIAGNOSIS — Z885 Allergy status to narcotic agent status: Secondary | ICD-10-CM | POA: Insufficient documentation

## 2019-08-28 DIAGNOSIS — O3680X0 Pregnancy with inconclusive fetal viability, not applicable or unspecified: Secondary | ICD-10-CM | POA: Diagnosis not present

## 2019-08-28 DIAGNOSIS — Z79899 Other long term (current) drug therapy: Secondary | ICD-10-CM | POA: Diagnosis not present

## 2019-08-28 DIAGNOSIS — O209 Hemorrhage in early pregnancy, unspecified: Secondary | ICD-10-CM | POA: Insufficient documentation

## 2019-08-28 DIAGNOSIS — Z9049 Acquired absence of other specified parts of digestive tract: Secondary | ICD-10-CM | POA: Diagnosis not present

## 2019-08-28 DIAGNOSIS — O26859 Spotting complicating pregnancy, unspecified trimester: Secondary | ICD-10-CM | POA: Diagnosis not present

## 2019-08-28 DIAGNOSIS — Z3201 Encounter for pregnancy test, result positive: Secondary | ICD-10-CM | POA: Diagnosis not present

## 2019-08-28 DIAGNOSIS — N930 Postcoital and contact bleeding: Secondary | ICD-10-CM

## 2019-08-28 DIAGNOSIS — O10911 Unspecified pre-existing hypertension complicating pregnancy, first trimester: Secondary | ICD-10-CM | POA: Diagnosis not present

## 2019-08-28 LAB — URINALYSIS, ROUTINE W REFLEX MICROSCOPIC
Bacteria, UA: NONE SEEN
Bilirubin Urine: NEGATIVE
Glucose, UA: NEGATIVE mg/dL
Ketones, ur: NEGATIVE mg/dL
Leukocytes,Ua: NEGATIVE
Nitrite: NEGATIVE
Protein, ur: NEGATIVE mg/dL
Specific Gravity, Urine: 1.019 (ref 1.005–1.030)
pH: 6 (ref 5.0–8.0)

## 2019-08-28 LAB — WET PREP, GENITAL
Sperm: NONE SEEN
Trich, Wet Prep: NONE SEEN
Yeast Wet Prep HPF POC: NONE SEEN

## 2019-08-28 LAB — COMPREHENSIVE METABOLIC PANEL
ALT: 13 U/L (ref 0–44)
AST: 12 U/L — ABNORMAL LOW (ref 15–41)
Albumin: 3.4 g/dL — ABNORMAL LOW (ref 3.5–5.0)
Alkaline Phosphatase: 79 U/L (ref 38–126)
Anion gap: 8 (ref 5–15)
BUN: 8 mg/dL (ref 6–20)
CO2: 24 mmol/L (ref 22–32)
Calcium: 9.3 mg/dL (ref 8.9–10.3)
Chloride: 106 mmol/L (ref 98–111)
Creatinine, Ser: 0.84 mg/dL (ref 0.44–1.00)
GFR calc Af Amer: >60 mL/min (ref >60)
GFR calc non Af Amer: >60 mL/min (ref >60)
Glucose, Bld: 107 mg/dL — ABNORMAL HIGH (ref 70–99)
Potassium: 4.2 mmol/L (ref 3.5–5.1)
Sodium: 138 mmol/L (ref 135–145)
Total Bilirubin: 0.3 mg/dL (ref 0.3–1.2)
Total Protein: 7.6 g/dL (ref 6.5–8.1)

## 2019-08-28 LAB — CBC
HCT: 39.4 % (ref 36.0–46.0)
Hemoglobin: 12.7 g/dL (ref 12.0–15.0)
MCH: 30.4 pg (ref 26.0–34.0)
MCHC: 32.2 g/dL (ref 30.0–36.0)
MCV: 94.3 fL (ref 80.0–100.0)
Platelets: 290 10*3/uL (ref 150–400)
RBC: 4.18 MIL/uL (ref 3.87–5.11)
RDW: 12.9 % (ref 11.5–15.5)
WBC: 7.1 10*3/uL (ref 4.0–10.5)
nRBC: 0 % (ref 0.0–0.2)

## 2019-08-28 LAB — ABO/RH: ABO/RH(D): O POS

## 2019-08-28 LAB — HCG, QUANTITATIVE, PREGNANCY: hCG, Beta Chain, Quant, S: 47 m[IU]/mL — ABNORMAL HIGH (ref <5)

## 2019-08-28 LAB — POCT URINE PREGNANCY: Preg Test, Ur: POSITIVE — AB

## 2019-08-28 MED ORDER — LABETALOL HCL 100 MG PO TABS
200.0000 mg | ORAL_TABLET | Freq: Once | ORAL | Status: AC
  Administered 2019-08-28: 200 mg via ORAL
  Filled 2019-08-28: qty 2

## 2019-08-28 MED ORDER — PRENATAL VITAMIN AND MINERAL 28-0.8 MG PO TABS: 1.0000 | ORAL_TABLET | Freq: Every day | ORAL | 2 refills | Status: DC

## 2019-08-28 MED ORDER — LABETALOL HCL 200 MG PO TABS: 200.0000 mg | ORAL_TABLET | Freq: Two times a day (BID) | ORAL | 0 refills | Status: DC

## 2019-08-28 NOTE — Assessment & Plan Note (Addendum)
Pregnancy confirmed with upreg today in office Patient feels overall well but has had spotting for 3 days. Due to uncertain dating from menstrual cycle and spotting, advised patient to go directly to [facility] for ultrasound and work-up. Need to evaluate for ectopic/abortion We will hold off on ordering prenatal labs as she will likely get blood drawn at the hospital as well. Patient will need referral to high-risk OB to follow this pregnancy due to her elevated blood pressures Sent in prenatal vitamin prescription and counseled patient on significance Patient is Rh+, not considering RhoGam at this time

## 2019-08-28 NOTE — MAU Provider Note (Signed)
History     CSN: [id number]  Arrival date and time: 08/28/19 1053   First Provider Initiated Contact with Patient 08/28/19 1131      Chief Complaint  Patient presents with  . Vaginal Bleeding   HPI Carol Blair is a 29 y.o. [phone] at [redacted]w[redacted]d who presents to MAU from [facility] for evaluation of recurrent vaginal spotting. Patient states she first noticed spotting the night of 08/24/2019 and has continued to see small amounts of spotting after intercourse. Patient previously took OCPs for contraception, most recently took her daily pill on 08/24/2019. She denies all other symptoms including abdominal pain dysuria, fever or recent illness.  Patient endorses history of elevated blood pressures. She previously managed her hypertension with medication but states her doctor gave her permission to discontinue it when her blood pressure normalized.    OB History    Gravida  4   Para  3   Term  3   Preterm      AB      Living  3     SAB      TAB      Ectopic      Multiple      Live Births  3           Past Medical History:  Diagnosis Date  . Benign essential hypertension antepartum 02/11/2011  . Hypertension   . Obesity   . Pregnancy induced hypertension     Past Surgical History:  Procedure Laterality Date  . LAPAROSCOPIC CHOLECYSTECTOMY W/ CHOLANGIOGRAPHY  06/10/10   Dr Gaynelle Adu    Family History  Problem Relation Age of Onset  . Hypertension Mother   . Hypertension Sister   . Kidney disease Sister   . Cancer Maternal Grandmother        GI  . Breast cancer Maternal Aunt   . Cancer Maternal Uncle        colon    Social History   Tobacco Use  . Smoking status: Never Smoker  . Smokeless tobacco: Never Used  Vaping Use  . Vaping Use: Never used  Substance Use Topics  . Alcohol use: No  . Drug use: No    Allergies:  Allergies  Allergen Reactions  . Codeine Nausea And Vomiting and Other (See Comments)    Shaking, sweating    Medications Prior  to Admission  Medication Sig Dispense Refill Last Dose  . acetaminophen (TYLENOL) 500 MG tablet Take 1,000 mg by mouth every 6 (six) hours as needed for headache.   Past Week at Unknown time  . Acetaminophen-Caffeine (EXCEDRIN TENSION HEADACHE) 500-65 MG TABS Take 2 tablets by mouth.   Past Week at Unknown time  . amLODipine (NORVASC) 10 MG tablet Take 1 tablet (10 mg total) by mouth at bedtime. 90 tablet 3   . cetirizine (ZYRTEC) 10 MG tablet Take 10 mg by mouth daily as needed for allergies. Reported on 07/03/2015   More than a month at Unknown time  . norethindrone (MICRONOR) 0.35 MG tablet Take 1 tablet (0.35 mg total) by mouth daily. 90 tablet 1   . Prenatal Vit-Fe Fumarate-FA (PRENATAL VITAMIN AND MINERAL) 28-0.8 MG TABS Take 1 tablet by mouth daily. 60 tablet 2     Review of Systems  Gastrointestinal: Negative for abdominal pain.  Genitourinary: Positive for vaginal bleeding.  Musculoskeletal: Negative for back pain.  All other systems reviewed and are negative.  Physical Exam   Blood pressure (!) 165/108, pulse 78, temperature 98.3  F (36.8 C), temperature source Oral, resp. rate 18, height 5\' 5"  (1.651 m), weight 126 kg, last menstrual period 07/23/2019, SpO2 100 %.  Physical Exam Vitals and nursing note reviewed. Exam conducted with a chaperone present.  Constitutional:      Appearance: Normal appearance.  Cardiovascular:     Rate and Rhythm: Normal rate.     Pulses: Normal pulses.     Heart sounds: Normal heart sounds.  Pulmonary:     Effort: Pulmonary effort is normal.     Breath sounds: Normal breath sounds.  Abdominal:     Tenderness: There is no abdominal tenderness.  Skin:    General: Skin is warm and dry.     Capillary Refill: Capillary refill takes less than 2 seconds.  Neurological:     General: No focal deficit present.     Mental Status: She is alert.  Psychiatric:        Mood and Affect: Mood normal.        Behavior: Behavior normal.     MAU Course   Procedures  --Pelvic exam declined by patient. Swabs collected via blind swab --Discussed pregnancy of unknown location, early viable pregnancy vs missed ab, indication for stat Quant hCG --[facility] Message sent to Dr. Dareen PianoAnderson and [facility] to see if they can accomplish this in conjunction with BP check in 48 hours. [facility] unable to do so --Initiated Labetalol 200 mg BID for Hypertension in pregnancy.  --Clue cells not consistent with physical exam or patient complaints. Will defer treatment  Patient Vitals for the past 24 hrs:  BP Temp Temp src Pulse Resp SpO2 Height Weight  08/28/19 1342 (!) 143/84 98.5 F (36.9 C) Oral 67 20 100 % -- --  08/28/19 1110 (!) 165/108 98.3 F (36.8 C) Oral 78 18 100 % 5\' 5"  (1.651 m) 126 kg   Orders Placed This Encounter  Procedures  . Wet prep, genital  . US OB LESS THAN 14 WEEKS WITH OB TRANSVAGINAL  . CBC  . Comprehensive metabolic panel  . hCG, quantitative, pregnancy  . Urinalysis, Routine w reflex microscopic Urine, Clean Catch  . ABO/Rh   Meds ordered this encounter  Medications  . labetalol (NORMODYNE) tablet 200 mg  . labetalol (NORMODYNE) 200 MG tablet    Sig: Take 1 tablet (200 mg total) by mouth 2 (two) times daily.    Dispense:  60 tablet    Refill:  0    Order Specific Question:   Supervising Provider    Answer:   Conan BowensDAVIS, KELLY M [[id number]]   Results for orders placed or performed during the hospital encounter of 08/28/19 (from the past 24 hour(s))  Urinalysis, Routine w reflex microscopic Urine, Clean Catch     Status: Abnormal   Collection Time: 08/28/19 11:24 AM  Result Value Ref Range   Color, Urine YELLOW YELLOW   APPearance CLEAR CLEAR   Specific Gravity, Urine 1.019 1.005 - 1.030   pH 6.0 5.0 - 8.0   Glucose, UA NEGATIVE NEGATIVE mg/dL   Hgb urine dipstick MODERATE (A) NEGATIVE   Bilirubin Urine NEGATIVE NEGATIVE   Ketones, ur NEGATIVE NEGATIVE  mg/dL   Protein, ur NEGATIVE NEGATIVE mg/dL   Nitrite NEGATIVE NEGATIVE   Leukocytes,Ua NEGATIVE NEGATIVE   RBC / HPF 0-5 0 - 5 RBC/hpf   WBC, UA 0-5 0 - 5 WBC/hpf   Bacteria, UA NONE SEEN NONE SEEN  Squamous Epithelial / LPF 0-5 0 - 5   Mucus PRESENT   CBC     Status: None   Collection Time: 08/28/19 11:36 AM  Result Value Ref Range   WBC 7.1 4.0 - 10.5 K/uL   RBC 4.18 3.87 - 5.11 MIL/uL   Hemoglobin 12.7 12.0 - 15.0 g/dL   HCT 05.3 36 - 46 %   MCV 94.3 80.0 - 100.0 fL   MCH 30.4 26.0 - 34.0 pg   MCHC 32.2 30.0 - 36.0 g/dL   RDW 97.6 73.4 - 19.3 %   Platelets 290 150 - 400 K/uL   nRBC 0.0 0.0 - 0.2 %  Comprehensive metabolic panel     Status: Abnormal   Collection Time: 08/28/19 11:36 AM  Result Value Ref Range   Sodium 138 135 - 145 mmol/L   Potassium 4.2 3.5 - 5.1 mmol/L   Chloride 106 98 - 111 mmol/L   CO2 24 22 - 32 mmol/L   Glucose, Bld 107 (H) 70 - 99 mg/dL   BUN 8 6 - 20 mg/dL   Creatinine, Ser 7.90 0.44 - 1.00 mg/dL   Calcium 9.3 8.9 - 24.0 mg/dL   Total Protein 7.6 6.5 - 8.1 g/dL   Albumin 3.4 (L) 3.5 - 5.0 g/dL   AST 12 (L) 15 - 41 U/L   ALT 13 0 - 44 U/L   Alkaline Phosphatase 79 38 - 126 U/L   Total Bilirubin 0.3 0.3 - 1.2 mg/dL   GFR calc non Af Amer >60 >60 mL/min   GFR calc Af Amer >60 >60 mL/min   Anion gap 8 5 - 15  hCG, quantitative, pregnancy     Status: Abnormal   Collection Time: 08/28/19 11:36 AM  Result Value Ref Range   hCG, Beta Chain, Quant, S 47 (H) <5 mIU/mL  ABO/Rh     Status: None   Collection Time: 08/28/19 11:36 AM  Result Value Ref Range   ABO/RH(D) O POS    No rh immune globuloin      NOT A RH IMMUNE GLOBULIN CANDIDATE, PT RH POSITIVE Performed at [facility], [address] [place], [place] [address]   Wet prep, genital     Status: Abnormal   Collection Time: 08/28/19 11:50 AM   Specimen: PATH Cytology Cervicovaginal Ancillary Only  Result Value Ref Range   Yeast Wet Prep HPF POC NONE SEEN NONE SEEN   Trich, Wet  Prep NONE SEEN NONE SEEN   Clue Cells Wet Prep HPF POC PRESENT (A) NONE SEEN   WBC, Wet Prep HPF POC FEW (A) NONE SEEN   Sperm NONE SEEN    US OB LESS THAN 14 WEEKS WITH OB TRANSVAGINAL  Result Date: 08/28/2019 CLINICAL DATA:  Vaginal bleeding for 3 days. Positive pregnancy test. EXAM: OBSTETRIC <14 WK [place] AND TRANSVAGINAL OB US TECHNIQUE: Both transabdominal and transvaginal ultrasound examinations were performed for complete evaluation of the gestation as well as the maternal uterus, adnexal regions, and pelvic cul-de-sac. Transvaginal technique was performed to assess early pregnancy. COMPARISON:  None. FINDINGS: Intrauterine gestational sac: None Maternal uterus/adnexae: No fibroids identified. Probable small right ovarian corpus luteum noted. Normal appearance left ovary. No adnexal mass or abnormal free fluid identified. IMPRESSION: Pregnancy of unknown anatomic location (no intrauterine gestational sac or adnexal mass identified). Differential diagnosis includes recent spontaneous abortion, IUP too early to visualize, and non-visualized ectopic pregnancy. Recommend correlation with serial beta-hCG levels, and follow up US if warranted clinically. Electronically Signed  By: Danae Orleans M.D.   On: 08/28/2019 12:58   Assessment and Plan  --29 y.o. [id number] with pregnancy of unknown location --Quant hCG 47 --Blood type O POS, Rhogam not indicated --HTN, new rx Labetalol --Discharge home in stable condition  F/U: --Stat Quant hCG in two days, message sent to [facility] --Will coordinate follow-up with [facility] if [facility] unavailable  Calvert Cantor, CNM 08/28/2019, 2:19 PM

## 2019-08-28 NOTE — Progress Notes (Signed)
    SUBJECTIVE:   CHIEF COMPLAINT / HPI: pregnancy confirmation.  29 year old female G3P3 presents with positive home urine pregnancy test on 8/19.  Patient had been on oral birth control pills for the past year and is consistent with taking them within the same hour every single day.  Her last pill was on 8/19.  Last recorded menstrual cycle was 7/18.  She typically has irregular scheduled cycles. She had some light spotting on Saturday, heavier on Sunday, had mild spotting when she woke up this morning and none throughout the day prior to appointment.  Denies any gushes of blood, cramping. Not taking prenatal vitamins. History of high risk pregnancies due to elevated blood pressures. Was previously taking amlodipine but is currently unmedicated. Blood pressure in office today 164/98. Has history of left ovarian cyst.  This pregnancy is unexpected but she plans to continue on with the pregnancy if viable.  Urine test in office today is positive.  OBJECTIVE:   BP (!) 164/98   Pulse 89   Wt 278 lb (126.1 kg)   LMP 07/23/2019 Comment: positive test 08-25-19  BMI 46.26 kg/m   General: NAD, pleasant, able to participate in exam Abdomen: soft, nontender, nondistended, no hepatic or splenomegaly, +BS Extremities: no edema or cyanosis. WWP. Skin: warm and dry, no rashes noted Neuro: alert and oriented x4, no focal deficits Psych: Normal affect and mood  ASSESSMENT/PLAN:   Spotting in pregnancy Pregnancy confirmed with upreg today in office Patient feels overall well but has had spotting for 3 days. Due to uncertain dating from menstrual cycle and spotting, advised patient to go directly to MAU for ultrasound and work-up. Need to evaluate for ectopic/abortion We will hold off on ordering prenatal labs as she will likely get blood drawn at the hospital as well. Patient will need referral to high-risk OB to follow this pregnancy due to her elevated blood pressures Sent in prenatal  vitamin prescription and counseled patient on significance Patient is Rh+, not considering RhoGam at this time     Leeroy Bock, DO [facility]

## 2019-08-28 NOTE — Progress Notes (Signed)
Future stat hcg quant ordered to track patient's progress after being seen in MAU. - referral for high risk OB

## 2019-08-28 NOTE — Discharge Instructions (Signed)
Human Chorionic Gonadotropin Test Why am I having this test? A human chorionic gonadotropin (hCG) test is done to determine whether you are pregnant. It can also be used:  To diagnose an abnormal pregnancy.  To determine whether you have had a failed pregnancy (miscarriage) or are at risk of one. What is being tested? This test checks the level of the human chorionic gonadotropin (hCG) hormone in the blood. This hormone is produced during pregnancy by the cells that form the placenta. The placenta is the organ that grows inside your womb (uterus) to nourish a developing baby. When you are pregnant, hCG can be detected in your blood or urine 7 to 8 days before your missed period. It continues to go up for the first 8-10 weeks of pregnancy. The presence of hCG in your blood can be measured with several different types of tests. You may have:  A urine test. ? Because this hormone is eliminated from your body by your kidneys, you may have a urine test to find out whether you are pregnant. A home pregnancy test detects whether there is hCG in your urine. ? A urine test only shows whether there is hCG in your urine. It does not measure how much.  A qualitative blood test. ? You may have this type of blood test to find out if you are pregnant. ? This blood test only shows whether there is hCG in your blood. It does not measure how much.  A quantitative blood test. ? This type of blood test measures the amount of hCG in your blood. ? You may have this test to:  Diagnose an abnormal pregnancy.  Check whether you have had a miscarriage.  Determine whether you are at risk of a miscarriage. What kind of sample is taken?     Two kinds of samples may be collected to test for the hCG hormone.  Blood. It is usually collected by inserting a needle into a blood vessel.  Urine. It is usually collected by urinating into a germ-free (sterile) specimen cup. It is best to collect the sample the first  time you urinate in the morning. How do I prepare for this test? No preparation is needed for a blood test.  For the urine test:  Let your health care provider know about: ? All medicines you are taking, including vitamins, herbs, creams, and over-the-counter medicines. ? Any blood in your urine. This may interfere with the result.  Do not drink too much fluid. Drink as you normally would, or as directed by your health care provider. How are the results reported? Depending on the type of test that you have, your test results may be reported as values. Your health care provider will compare your results to normal ranges that were established after testing a large group of people (reference ranges). Reference ranges may vary among labs and hospitals. For this test, common reference ranges that show absence of pregnancy are:  Quantitative hCG blood levels: less than 5 IU/L. Other results will be reported as either positive or negative. For this test, normal results (meaning the absence of pregnancy) are:  Negative for hCG in the urine test.  Negative for hCG in the qualitative blood test. What do the results mean? Urine and qualitative blood test  A negative result could mean: ? That you are not pregnant. ? That the test was done too early in your pregnancy to detect hCG in your blood or urine. If you still have other signs   of pregnancy, the test will be repeated.  A positive result means: ? That you are most likely pregnant. Your health care provider may confirm your pregnancy with an imaging study (ultrasound) of your uterus, if needed. Quantitative blood test Results of the quantitative hCG blood test will be interpreted as follows:  Less than 5 IU/L: You are most likely not pregnant.  Greater than 25 IU/L: You are most likely pregnant.  hCG levels that are higher than expected: ? You are pregnant with twins. ? You have abnormal growths in the uterus.  hCG levels that are  rising more slowly than expected: ? You have an ectopic pregnancy (also called a tubal pregnancy).  hCG levels that are falling: ? You may be having a miscarriage. Talk with your health care provider about what your results mean. Questions to ask your health care provider Ask your health care provider, or the department that is doing the test:  When will my results be ready?  How will I get my results?  What are my treatment options?  What other tests do I need?  What are my next steps? Summary  A human chorionic gonadotropin test is done to determine whether you are pregnant.  When you are pregnant, hCG can be detected in your blood or urine 7 to 8 days before your missed period. It continues to go up for the first 8-10 weeks of pregnancy.  Your hCG level can be measured with different types of tests. You may have a urine test, a qualitative blood test, or a quantitative blood test.  Talk with your health care provider about what your results mean. This information is not intended to replace advice given to you by your health care provider. Make sure you discuss any questions you have with your health care provider. Document Revised: 11/23/2016 Document Reviewed: 11/23/2016 Elsevier Patient Education  2020 Elsevier Inc.  

## 2019-08-28 NOTE — MAU Note (Signed)
Went into [facility] for pregnancy confirmation.  Sent over for further eval because of spotting.  Started on Saturday. Feeling more pressure like cycle is going to start, not pain.  Has been on the pill, look last in pill pack on the 19th of Aug

## 2019-08-29 ENCOUNTER — Other Ambulatory Visit: Payer: Self-pay | Admitting: Family Medicine

## 2019-08-29 DIAGNOSIS — O26859 Spotting complicating pregnancy, unspecified trimester: Secondary | ICD-10-CM

## 2019-08-29 LAB — GC/CHLAMYDIA PROBE AMP (~~LOC~~) NOT AT ARMC
Chlamydia: NEGATIVE
Comment: NEGATIVE
Comment: NORMAL
Neisseria Gonorrhea: NEGATIVE

## 2019-08-30 ENCOUNTER — Ambulatory Visit: Payer: Medicaid Other

## 2019-08-30 ENCOUNTER — Telehealth: Payer: Self-pay | Admitting: Family Medicine

## 2019-08-30 ENCOUNTER — Other Ambulatory Visit: Payer: Self-pay

## 2019-08-30 DIAGNOSIS — O209 Hemorrhage in early pregnancy, unspecified: Secondary | ICD-10-CM

## 2019-08-30 LAB — BETA HCG QUANT (REF LAB): hCG Quant: 107 m[IU]/mL

## 2019-08-30 NOTE — Telephone Encounter (Signed)
Called patient with beta-HCG results. This rose >50% in 48 hours, which is expected for IUP. She has no further cramping, only pink discharge yesterday. Given HCG obtained at two different labs, will repeat on Friday. Ordered, scheduled on lab visit. Discussed need for repeat ultrasound, ordered. Discussed return precautions to MAU---bleeding, pain, severe cramping. All questions answered.     Nursing- please call and schedule another transvaginal ultrasound for patient in 2 weeks (September 7-10). Please call and let patient know time and date.  Let me know if you/the patient has questions.   Terisa Starr, MD  Family Medicine Teaching Service

## 2019-08-30 NOTE — Progress Notes (Signed)
Patient here today for BP check.      Last BP was on 08/28/2019 and was 164/98.  BP today is 132/80.  Checked BP in left arm with large cuff.    Patient has been compliant with BP meds. Takes one in AM and one in PM. Last took last night and this morning.   Patient sent to lab for hcg.

## 2019-08-31 ENCOUNTER — Ambulatory Visit: Payer: Medicaid Other | Admitting: Family Medicine

## 2019-08-31 NOTE — Telephone Encounter (Signed)
Pt scheduled and informed. Carol Blair, CMA  

## 2019-09-01 ENCOUNTER — Other Ambulatory Visit: Payer: Medicaid Other

## 2019-09-01 ENCOUNTER — Other Ambulatory Visit: Payer: Self-pay

## 2019-09-01 DIAGNOSIS — O209 Hemorrhage in early pregnancy, unspecified: Secondary | ICD-10-CM

## 2019-09-02 ENCOUNTER — Telehealth: Payer: Self-pay | Admitting: Family Medicine

## 2019-09-02 DIAGNOSIS — O26859 Spotting complicating pregnancy, unspecified trimester: Secondary | ICD-10-CM

## 2019-09-02 LAB — BETA HCG QUANT (REF LAB): hCG Quant: 194 m[IU]/mL

## 2019-09-02 NOTE — Telephone Encounter (Signed)
Called patient. Greater than 53% rise in HCG, which is appropriate.   Given expected rise X2 will repeat next week. Return precautions given, which she repeated back. All questions answered, schedule for HCG prior to ultrasound.  Terisa Starr, MD  Family Medicine Teaching Service

## 2019-09-08 ENCOUNTER — Other Ambulatory Visit: Payer: Self-pay

## 2019-09-08 ENCOUNTER — Other Ambulatory Visit: Payer: Medicaid Other

## 2019-09-08 DIAGNOSIS — O26859 Spotting complicating pregnancy, unspecified trimester: Secondary | ICD-10-CM

## 2019-09-08 DIAGNOSIS — Z3201 Encounter for pregnancy test, result positive: Secondary | ICD-10-CM | POA: Diagnosis not present

## 2019-09-09 ENCOUNTER — Telehealth: Payer: Self-pay | Admitting: Family Medicine

## 2019-09-09 LAB — BETA HCG QUANT (REF LAB): hCG Quant: 9 m[IU]/mL

## 2019-09-09 NOTE — Telephone Encounter (Signed)
Called patient with results. HCG is now 9, indicating likely SAB. She had spotting earlier this week post coital. None in last 4 days, no pain. Discussed, support offered. Repeat on Tuesday to confirm. Will plan to cancel ultrasound. Reviewed strict return precautions including pain and bleeding.  Terisa Starr, MD  Family Medicine Teaching Service

## 2019-09-11 ENCOUNTER — Other Ambulatory Visit: Payer: Self-pay | Admitting: Family Medicine

## 2019-09-11 DIAGNOSIS — O26859 Spotting complicating pregnancy, unspecified trimester: Secondary | ICD-10-CM

## 2019-09-12 ENCOUNTER — Other Ambulatory Visit: Payer: Self-pay

## 2019-09-12 ENCOUNTER — Other Ambulatory Visit: Payer: Medicaid Other

## 2019-09-12 DIAGNOSIS — O26859 Spotting complicating pregnancy, unspecified trimester: Secondary | ICD-10-CM

## 2019-09-13 ENCOUNTER — Ambulatory Visit
Admission: RE | Admit: 2019-09-13 | Discharge: 2019-09-13 | Disposition: A | Discharge status: Home / Self Care | Payer: Medicaid Other | Source: Ambulatory Visit | Attending: Family Medicine | Admitting: Family Medicine

## 2019-09-13 ENCOUNTER — Telehealth: Payer: Self-pay | Admitting: Family Medicine

## 2019-09-13 DIAGNOSIS — O209 Hemorrhage in early pregnancy, unspecified: Secondary | ICD-10-CM

## 2019-09-13 LAB — BETA HCG QUANT (REF LAB): hCG Quant: 7 m[IU]/mL

## 2019-09-13 NOTE — Telephone Encounter (Signed)
Called with results, still having intermittent spotting after intercourse. No pain, no heavy bleeding.  Has had HCG of 9 then 7, most likely early pregnancy loss. Recommend repeat POC urine pregnancy at follow up and exam. Scheduled for patient on 9/13. She is to go to ED/Urgent Care for heavy bleeding, pain, or new symptoms.  Terisa Starr, MD  Family Medicine Teaching Service

## 2019-09-18 ENCOUNTER — Ambulatory Visit (INDEPENDENT_AMBULATORY_CARE_PROVIDER_SITE_OTHER): Payer: Medicaid Other | Admitting: Family Medicine

## 2019-09-18 ENCOUNTER — Other Ambulatory Visit: Payer: Self-pay

## 2019-09-18 ENCOUNTER — Encounter: Payer: Self-pay | Admitting: Family Medicine

## 2019-09-18 VITALS — BP 125/70 | HR 74 | Ht 64.0 in | Wt 279.2 lb

## 2019-09-18 DIAGNOSIS — O26859 Spotting complicating pregnancy, unspecified trimester: Secondary | ICD-10-CM

## 2019-09-18 DIAGNOSIS — O039 Complete or unspecified spontaneous abortion without complication: Secondary | ICD-10-CM | POA: Insufficient documentation

## 2019-09-18 DIAGNOSIS — N930 Postcoital and contact bleeding: Secondary | ICD-10-CM | POA: Diagnosis not present

## 2019-09-18 LAB — POCT WET PREP (WET MOUNT)
Clue Cells Wet Prep Whiff POC: POSITIVE
Trichomonas Wet Prep HPF POC: ABSENT

## 2019-09-18 NOTE — Progress Notes (Signed)
    SUBJECTIVE:   CHIEF COMPLAINT / HPI: " Follow-up vaginal bleeding"  Carol Blair is a 29 year old female presenting for follow-up.  Miscarriage: Initial positive pregnancy test on 8/23 and had menstrual spotting, had been taking OCPs during this time.  Pregnancy was not planned.  Sent to the MAU which confirmed a beta-hCG of 47.  Transvaginal U/S on 8/23 showing pregnancy of unknown anatomic location without any adnexal/tube mass.  She continued with serial beta hCGs as bleeding restarted, 107>194 >9 >7 six days ago.   She did a home urine pregnancy test this morning prior to visit which she believes was negative, however did see shadow of the second line.  She is still having some scant bleeding, just a little bit of pink on her liner.  Not having any pelvic pain, but does feel bloated.  Seems to have a little bleeding more after intercourse.  Unsure if she has had any change in vaginal discharge recently.  Did appear to have BV on recent wet prep, does not believe she completed treatment, otherwise no concern for STDs.  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, hypertension, chronic headaches  OBJECTIVE:   BP 125/70   Pulse 74   Ht 5\' 4"  (1.626 m)   Wt 279 lb 3.2 oz (126.6 kg)   LMP 07/23/2019   SpO2 97%   BMI 47.92 kg/m   General: Alert, NAD HEENT: NCAT  Lungs: No increased WOB  Abdomen: soft, non-tender, non-distended  Pelvic exam: VULVA: normal appearing vulva with no masses, tenderness or lesions, VAGINA: vaginal discharge - white and creamy, no bleeding seen in vaginal vault CERVIX: normal appearing cervix without discharge or lesions, cervix closed on bimanual exam without motion tenderness UTERUS: uterus is normal size, shape, consistency and nontender, ADNEXA: normal adnexa in size, nontender and no masses. Ext: Warm, dry, 2+ distal pulses   ASSESSMENT/PLAN:   Spontaneous miscarriage As evident by recent bleeding and cramping with decline in serum beta-hCG.   Unremarkable pelvic exam without elicited adnexal tenderness. Will recheck beta-hCG today to confirm expected downtrend to WNL.  Recommended routine condom use and restarting contraception in the near future.  Postcoital bleeding Likely related to the above, however could consider vaginal infection contributing.  Wet prep positive for BV, while this is less likely to be irritative can be possible.  GC/Chlamydia negative on 8/23.  Will Rx Flagyl after beta-hCG returns.    Follow-up pending results above.  9/23, DO [facility]

## 2019-09-18 NOTE — Patient Instructions (Signed)
It was wonderful to see you today.  We are going to recheck your blood beta-hCG level, which I am sure will show low again.  Please make sure you are using condoms when you are sexually active and we can discuss restarting your contraception.  I will let you know the results of your labs in the next few days, I will either call or MyChart message you.  If you start having severe vaginal bleeding or increased pelvic pain again, please go to the MAU.

## 2019-09-18 NOTE — Assessment & Plan Note (Signed)
Likely related to the above, however could consider vaginal infection contributing.  Wet prep positive for BV, while this is less likely to be irritative can be possible.  GC/Chlamydia negative on 8/23.  Will Rx Flagyl after beta-hCG returns.

## 2019-09-18 NOTE — Assessment & Plan Note (Signed)
As evident by recent bleeding and cramping with decline in serum beta-hCG.  Unremarkable pelvic exam without elicited adnexal tenderness. Will recheck beta-hCG today to confirm expected downtrend to WNL.  Recommended routine condom use and restarting contraception in the near future.

## 2019-09-19 ENCOUNTER — Other Ambulatory Visit: Payer: Self-pay | Admitting: Family Medicine

## 2019-09-19 DIAGNOSIS — N760 Acute vaginitis: Secondary | ICD-10-CM

## 2019-09-19 LAB — HUMAN CHORIONIC GONADOTROPIN(HCG),B-SUBUNIT,QUANTITATIVE): HCG, Beta Chain, Quant, S: 5 m[IU]/mL

## 2019-09-19 MED ORDER — METRONIDAZOLE 500 MG PO TABS: 500.0000 mg | ORAL_TABLET | Freq: Two times a day (BID) | ORAL | 0 refills | Status: AC

## 2019-10-02 ENCOUNTER — Telehealth: Payer: Medicaid Other

## 2019-10-09 ENCOUNTER — Encounter: Payer: Medicaid Other | Admitting: Obstetrics & Gynecology

## 2019-10-16 ENCOUNTER — Other Ambulatory Visit: Payer: Self-pay

## 2019-10-16 NOTE — Telephone Encounter (Signed)
Please call patient and identify is still taking this.   Thank you Terisa Starr, MD  [facility] Medicine Teaching Service

## 2019-10-19 MED ORDER — LABETALOL HCL 200 MG PO TABS
200.0000 mg | ORAL_TABLET | Freq: Two times a day (BID) | ORAL | 1 refills | Status: DC
Start: 2019-10-19 — End: 2019-12-20

## 2019-10-19 NOTE — Addendum Note (Signed)
Addended by: Manson Passey, Draven Laine on: 10/19/2019 02:33 PM   Modules accepted: Orders

## 2019-10-19 NOTE — Telephone Encounter (Signed)
Rx at pharmacy for patient.  Thank you! Terisa Starr, MD  Family Medicine Teaching Service

## 2019-10-19 NOTE — Telephone Encounter (Signed)
Patient returns call to nurse line and states that she has been taking medication, however, ran out about 3 weeks ago. Patient reports multiple attempts at refilling medication. Informed patient I would get message to provider that she is still using this medication.   To PCP  Veronda Prude, RN

## 2019-10-19 NOTE — Telephone Encounter (Signed)
Called patient and informed of below.   Return/ ED precautions given  Veronda Prude, RN

## 2019-10-19 NOTE — Telephone Encounter (Signed)
LMOVM for pt to call us back. Viral Schramm, CMA  

## 2019-12-20 ENCOUNTER — Encounter: Payer: Self-pay | Admitting: Family Medicine

## 2019-12-20 ENCOUNTER — Other Ambulatory Visit: Payer: Self-pay

## 2019-12-20 ENCOUNTER — Ambulatory Visit (INDEPENDENT_AMBULATORY_CARE_PROVIDER_SITE_OTHER): Payer: Medicaid Other | Admitting: Family Medicine

## 2019-12-20 VITALS — BP 125/70 | HR 71 | Ht 65.0 in | Wt 292.2 lb

## 2019-12-20 DIAGNOSIS — I10 Essential (primary) hypertension: Secondary | ICD-10-CM

## 2019-12-20 DIAGNOSIS — Z3201 Encounter for pregnancy test, result positive: Secondary | ICD-10-CM

## 2019-12-20 DIAGNOSIS — Z3200 Encounter for pregnancy test, result unknown: Secondary | ICD-10-CM | POA: Diagnosis not present

## 2019-12-20 DIAGNOSIS — Z3491 Encounter for supervision of normal pregnancy, unspecified, first trimester: Secondary | ICD-10-CM | POA: Insufficient documentation

## 2019-12-20 LAB — POCT URINE PREGNANCY: Preg Test, Ur: POSITIVE — AB

## 2019-12-20 MED ORDER — LABETALOL HCL 100 MG PO TABS: 100.0000 mg | ORAL_TABLET | Freq: Two times a day (BID) | ORAL | 0 refills | Status: DC

## 2019-12-20 NOTE — Assessment & Plan Note (Addendum)
Positive pregnancy test confirmed in the clinic today, unsure dating however suspect around ~[redacted] weeks gestation based off LMP.  Known chronic hypertension on labetalol, will need to follow with high risk OB clinic with OB/GYN, referral placed.  Obtained initial prenatal labs today (with exception of GC/CH) and will schedule for dating U/S.  Continue PNV.

## 2019-12-20 NOTE — Patient Instructions (Addendum)
Please start checking your blood pressure at home now that we have reduced the amount of medication.  If your blood pressure is above 140/90 consistently then please call our office.  Additionally if your top number is consistently under 100/110 and you are still experiencing the symptoms, please let us know and follow up.   Safe Medications in Pregnancy   Acne: Benzoyl Peroxide Salicylic Acid  Backache/Headache: Tylenol: 2 regular strength every 4 hours OR              2 Extra strength every 6 hours  Colds/Coughs/Allergies: Benadryl (alcohol free) 25 mg every 6 hours as needed Breath right strips Claritin Cepacol throat lozenges Chloraseptic throat spray Cold-Eeze- up to three times per day Cough drops, alcohol free Flonase (by prescription only) Guaifenesin Mucinex Robitussin DM (plain only, alcohol free) Saline nasal spray/drops Sudafed (pseudoephedrine) & Actifed ** use only after [redacted] weeks gestation and if you do not have high blood pressure Tylenol Vicks Vaporub Zinc lozenges Zyrtec   Constipation: Colace Ducolax suppositories Fleet enema Glycerin suppositories Metamucil Milk of magnesia Miralax Senokot Smooth move tea  Diarrhea: Kaopectate Imodium A-D  *NO pepto Bismol  Hemorrhoids: Anusol Anusol HC Preparation H Tucks  Indigestion: Tums Maalox Mylanta Zantac  Pepcid  Insomnia: Benadryl (alcohol free) 25mg  every 6 hours as needed Tylenol PM Unisom, no Gelcaps  Leg Cramps: Tums MagGel  Nausea/Vomiting:  Bonine Dramamine Emetrol Ginger extract Sea bands Meclizine  Nausea medication to take during pregnancy:  Unisom (doxylamine succinate 25 mg tablets) Take one tablet daily at bedtime. If symptoms are not adequately controlled, the dose can be increased to a maximum recommended dose of two tablets daily (1/2 tablet in the morning, 1/2 tablet mid-afternoon and one at bedtime). Vitamin B6 100mg  tablets. Take one tablet twice a day (up to  200 mg per day).  Skin Rashes: Aveeno products Benadryl cream or 25mg  every 6 hours as needed Calamine Lotion 1% Hydrocortisone cream  Yeast infection: Gyne-lotrimin 7 Monistat 7   **If taking multiple medications, please check labels to avoid duplicating the same active ingredients **take medication as directed on the label ** Do not exceed 4000 mg of tylenol in 24 hours **Do not take medications that contain aspirin or ibuprofen     First Trimester of Pregnancy The first trimester of pregnancy is from week 1 until the end of week 13 (months 1 through 3). A week after a sperm fertilizes an egg, the egg will implant on the wall of the uterus. This embryo will begin to develop into a baby. Genes from you and your partner will form the baby. The female genes will determine whether the baby will be a boy or a girl. At 6-8 weeks, the eyes and face will be formed, and the heartbeat can be seen on ultrasound. At the end of 12 weeks, all the baby's organs will be formed. Now that you are pregnant, you will want to do everything you can to have a healthy baby. Two of the most important things are to get good prenatal care and to follow your health care provider's instructions. Prenatal care is all the medical care you receive before the baby's birth. This care will help prevent, find, and treat any problems during the pregnancy and childbirth. Body changes during your first trimester Your body goes through many changes during pregnancy. The changes vary from woman to woman.  You may gain or lose a couple of pounds at first.  You may feel sick to  your stomach (nauseous) and you may throw up (vomit). If the vomiting is uncontrollable, call your health care provider.  You may tire easily.  You may develop headaches that can be relieved by medicines. All medicines should be approved by your health care provider.  You may urinate more often. Painful urination may mean you have a bladder  infection.  You may develop heartburn as a result of your pregnancy.  You may develop constipation because certain hormones are causing the muscles that push stool through your intestines to slow down.  You may develop hemorrhoids or swollen veins (varicose veins).  Your breasts may begin to grow larger and become tender. Your nipples may stick out more, and the tissue that surrounds them (areola) may become darker.  Your gums may bleed and may be sensitive to brushing and flossing.  Dark spots or blotches (chloasma, mask of pregnancy) may develop on your face. This will likely fade after the baby is born.  Your menstrual periods will stop.  You may have a loss of appetite.  You may develop cravings for certain kinds of food.  You may have changes in your emotions from day to day, such as being excited to be pregnant or being concerned that something may go wrong with the pregnancy and baby.  You may have more vivid and strange dreams.  You may have changes in your hair. These can include thickening of your hair, rapid growth, and changes in texture. Some women also have hair loss during or after pregnancy, or hair that feels dry or thin. Your hair will most likely return to normal after your baby is born. What to expect at prenatal visits During a routine prenatal visit:  You will be weighed to make sure you and the baby are growing normally.  Your blood pressure will be taken.  Your abdomen will be measured to track your baby's growth.  The fetal heartbeat will be listened to between weeks 10 and 14 of your pregnancy.  Test results from any previous visits will be discussed. Your health care provider may ask you:  How you are feeling.  If you are feeling the baby move.  If you have had any abnormal symptoms, such as leaking fluid, bleeding, severe headaches, or abdominal cramping.  If you are using any tobacco products, including cigarettes, chewing tobacco, and  electronic cigarettes.  If you have any questions. Other tests that may be performed during your first trimester include:  Blood tests to find your blood type and to check for the presence of any previous infections. The tests will also be used to check for low iron levels (anemia) and protein on red blood cells (Rh antibodies). Depending on your risk factors, or if you previously had diabetes during pregnancy, you may have tests to check for high blood sugar that affects pregnant women (gestational diabetes).  Urine tests to check for infections, diabetes, or protein in the urine.  An ultrasound to confirm the proper growth and development of the baby.  Fetal screens for spinal cord problems (spina bifida) and Down syndrome.  HIV (human immunodeficiency virus) testing. Routine prenatal testing includes screening for HIV, unless you choose not to have this test.  You may need other tests to make sure you and the baby are doing well. Follow these instructions at home: Medicines  Follow your health care provider's instructions regarding medicine use. Specific medicines may be either safe or unsafe to take during pregnancy.  Take a prenatal vitamin that  contains at least 600 micrograms (mcg) of folic acid.  If you develop constipation, try taking a stool softener if your health care provider approves. Eating and drinking   Eat a balanced diet that includes fresh fruits and vegetables, whole grains, good sources of protein such as meat, eggs, or tofu, and low-fat dairy. Your health care provider will help you determine the amount of weight gain that is right for you.  Avoid raw meat and uncooked cheese. These carry germs that can cause birth defects in the baby.  Eating four or five small meals rather than three large meals a day may help relieve nausea and vomiting. If you start to feel nauseous, eating a few soda crackers can be helpful. Drinking liquids between meals, instead of during  meals, also seems to help ease nausea and vomiting.  Limit foods that are high in fat and processed sugars, such as fried and sweet foods.  To prevent constipation: ? Eat foods that are high in fiber, such as fresh fruits and vegetables, whole grains, and beans. ? Drink enough fluid to keep your urine clear or pale yellow. Activity  Exercise only as directed by your health care provider. Most women can continue their usual exercise routine during pregnancy. Try to exercise for 30 minutes at least 5 days a week. Exercising will help you: ? Control your weight. ? Stay in shape. ? Be prepared for labor and delivery.  Experiencing pain or cramping in the lower abdomen or lower back is a good sign that you should stop exercising. Check with your health care provider before continuing with normal exercises.  Try to avoid standing for long periods of time. Move your legs often if you must stand in one place for a long time.  Avoid heavy lifting.  Wear low-heeled shoes and practice good posture.  You may continue to have sex unless your health care provider tells you not to. Relieving pain and discomfort  Wear a good support bra to relieve breast tenderness.  Take warm sitz baths to soothe any pain or discomfort caused by hemorrhoids. Use hemorrhoid cream if your health care provider approves.  Rest with your legs elevated if you have leg cramps or low back pain.  If you develop varicose veins in your legs, wear support hose. Elevate your feet for 15 minutes, 3-4 times a day. Limit salt in your diet. Prenatal care  Schedule your prenatal visits by the twelfth week of pregnancy. They are usually scheduled monthly at first, then more often in the last 2 months before delivery.  Write down your questions. Take them to your prenatal visits.  Keep all your prenatal visits as told by your health care provider. This is important. Safety  Wear your seat belt at all times when  driving.  Make a list of emergency phone numbers, including numbers for family, friends, the hospital, and police and fire departments. General instructions  Ask your health care provider for a referral to a local prenatal education class. Begin classes no later than the beginning of month 6 of your pregnancy.  Ask for help if you have counseling or nutritional needs during pregnancy. Your health care provider can offer advice or refer you to specialists for help with various needs.  Do not use hot tubs, steam rooms, or saunas.  Do not douche or use tampons or scented sanitary pads.  Do not cross your legs for long periods of time.  Avoid cat litter boxes and soil used by cats.  These carry germs that can cause birth defects in the baby and possibly loss of the fetus by miscarriage or stillbirth.  Avoid all smoking, herbs, alcohol, and medicines not prescribed by your health care provider. Chemicals in these products affect the formation and growth of the baby.  Do not use any products that contain nicotine or tobacco, such as cigarettes and e-cigarettes. If you need help quitting, ask your health care provider. You may receive counseling support and other resources to help you quit.  Schedule a dentist appointment. At home, brush your teeth with a soft toothbrush and be gentle when you floss. Contact a health care provider if:  You have dizziness.  You have mild pelvic cramps, pelvic pressure, or nagging pain in the abdominal area.  You have persistent nausea, vomiting, or diarrhea.  You have a bad smelling vaginal discharge.  You have pain when you urinate.  You notice increased swelling in your face, hands, legs, or ankles.  You are exposed to fifth disease or chickenpox.  You are exposed to [place] measles (rubella) and have never had it. Get help right away if:  You have a fever.  You are leaking fluid from your vagina.  You have spotting or bleeding from your  vagina.  You have severe abdominal cramping or pain.  You have rapid weight gain or loss.  You vomit blood or material that looks like coffee grounds.  You develop a severe headache.  You have shortness of breath.  You have any kind of trauma, such as from a fall or a car accident. Summary  The first trimester of pregnancy is from week 1 until the end of week 13 (months 1 through 3).  Your body goes through many changes during pregnancy. The changes vary from woman to woman.  You will have routine prenatal visits. During those visits, your health care provider will examine you, discuss any test results you may have, and talk with you about how you are feeling. This information is not intended to replace advice given to you by your health care provider. Make sure you discuss any questions you have with your health care provider. Document Revised: 12/04/2016 Document Reviewed: 12/04/2015 Elsevier Patient Education  2020 [employer].

## 2019-12-20 NOTE — Assessment & Plan Note (Addendum)
BP WNL today, do question if having episodes of lower pressure/hypotension with physiologic changes in addition to labetalol dosing given symptoms.  Will decrease to labetalol 100 mg BID and have her check her BP at home.  If consistently elevated (140/90), instructed to call our clinic and increase back up to 200 mg.

## 2019-12-20 NOTE — Progress Notes (Signed)
    SUBJECTIVE:   CHIEF COMPLAINT / HPI: Positive pregnancy test   Carol Blair is a 28 year old [id number] presenting for further evaluation after positive pregnancy test at home.  She last was seen after a spontaneous miscarriage in 09/2019.  She and her partner decided to passively attempt to conceive again rather than contraception, first had a new positive pregnancy test around 11/20.  LMP 11/04/2019, however was slightly different in flow compared to usual and also had two menstrual cycles in October.  Usually has a cycle every 24 days.  She has not had any vaginal spotting or bleeding since a positive test at home.  She is excited would like to continue with prenatal care.  [facility] pre[facility]/gestational hypertension in the past.  Formally diagnosed with chronic hypertension last year was on Norvasc until transition to labetalol in 09/2019 (positive pregnancy at that time).  All vaginal deliveries in the past.  Already taking a prenatal vitamin.  Has noticed with her labetalol that she has felt more lightheaded and tired whenever she takes her medication.  Has not checked her blood pressure at home.  No significant nausea.  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, hypertension, chronic headaches  OBJECTIVE:   BP 125/70   Pulse 71   Ht 5\' 5"  (1.651 m)   Wt 292 lb 3.2 oz (132.5 kg)   LMP 11/04/2019   SpO2 100%   Breastfeeding Unknown   BMI 48.62 kg/m   General: Alert, NAD HEENT: NCAT, MMM, EOMI, PERRLA Cardiac: RRR  Lungs: Clear bilaterally, no increased WOB on RA Abdomen: soft Msk/neuro: Moves all extremities spontaneously, normal gait, 5/5 upper and lower extremity strength bilaterally Ext: Warm, dry, 2+ distal pulses  ASSESSMENT/PLAN:   First trimester pregnancy Positive pregnancy test confirmed in the clinic today, unsure dating however suspect around ~[redacted] weeks gestation based off LMP.  Known chronic  hypertension on labetalol, will need to follow with high risk OB clinic with OB/GYN, referral placed.  Obtained initial prenatal labs today (with exception of GC/CH) and will schedule for dating U/S.  Continue PNV.  Hypertension BP WNL today, do question if having episodes of lower pressure/hypotension with physiologic changes in addition to labetalol dosing given symptoms.  Will decrease to labetalol 100 mg BID and have her check her BP at home.  If consistently elevated (140/90), instructed to call our clinic and increase back up to 200 mg.    Follow-up with OB/GYN, pending on availability to establish care follow-up in our clinic in the next 2-3 weeks to check in on her blood pressure and symptoms.  11/06/2019, DO [facility]

## 2019-12-22 LAB — AB SCR+ANTIBODY ID: Antibody Screen: POSITIVE — AB

## 2019-12-22 LAB — HGB FRACTIONATION CASCADE
Hgb A2: 2.6 % (ref 1.8–3.2)
Hgb A: 97.4 % (ref 96.4–98.8)
Hgb F: 0 % (ref 0.0–2.0)
Hgb S: 0 %

## 2019-12-22 LAB — OBSTETRIC PANEL, INCLUDING HIV
Basophils Absolute: 0 10*3/uL (ref 0.0–0.2)
Basos: 0 %
EOS (ABSOLUTE): 0.1 10*3/uL (ref 0.0–0.4)
Eos: 1 %
HIV Screen 4th Generation wRfx: NONREACTIVE
Hematocrit: 37.8 % (ref 34.0–46.6)
Hemoglobin: 12.3 g/dL (ref 11.1–15.9)
Hepatitis B Surface Ag: NEGATIVE
Immature Grans (Abs): 0 10*3/uL (ref 0.0–0.1)
Immature Granulocytes: 0 %
Lymphocytes Absolute: 2.5 10*3/uL (ref 0.7–3.1)
Lymphs: 38 %
MCH: 30.4 pg (ref 26.6–33.0)
MCHC: 32.5 g/dL (ref 31.5–35.7)
MCV: 93 fL (ref 79–97)
Monocytes Absolute: 0.4 10*3/uL (ref 0.1–0.9)
Monocytes: 6 %
Neutrophils Absolute: 3.6 10*3/uL (ref 1.4–7.0)
Neutrophils: 55 %
Platelets: 291 10*3/uL (ref 150–450)
RBC: 4.05 x10E6/uL (ref 3.77–5.28)
RDW: 13.1 % (ref 11.7–15.4)
RPR Ser Ql: NONREACTIVE
Rh Factor: POSITIVE
Rubella Antibodies, IGG: 7.32 index (ref >0.99)
WBC: 6.6 10*3/uL (ref 3.4–10.8)

## 2019-12-22 LAB — HCV AB W REFLEX TO QUANT PCR: HCV Ab: <0.1 s/co ratio (ref 0.0–0.9)

## 2019-12-22 LAB — HCV INTERPRETATION

## 2019-12-23 LAB — CULTURE, OB URINE

## 2019-12-23 LAB — URINE CULTURE, OB REFLEX

## 2019-12-25 ENCOUNTER — Telehealth: Payer: Self-pay

## 2019-12-25 NOTE — Telephone Encounter (Signed)
Called patient and informed her of upcoming appointment.  Ultrasound 01/18/2020 1300 w/ 1245 arrival [address] [place], [place] [address] [phone]  Patient verbalized understanding.  Glennie Hawk, CMA

## 2020-01-06 NOTE — L&D Delivery Note (Signed)
OB/GYN Faculty Practice Delivery Note  Carol Blair is a 30 y.o. [phone] s/p vaginal delivery at [redacted]w[redacted]d. She was admitted for IOL secondary to worsening cHTN.   ROM: 5h 4m with clear fluid GBS Status: negative Maximum Maternal Temperature: 97.61F  Labor Progress: FB placed on admission with immediate SROM for clear fluid. Pt was then started on pitocin and progressed to complete cervical dilation.  Delivery Date/Time: 07/22/20 at 1834 Delivery: Called to room and patient was complete and pushing. Head delivered LOA with compound fetal hand. No nuchal cord present. Shoulder and body delivered in usual fashion. Infant with spontaneous cry, placed on mother's abdomen, dried and stimulated. Cord clamped x 2 after 1-minute delay, and cut by FOB under my direct supervision. Cord blood drawn. Placenta delivered spontaneously with gentle cord traction. Fundus firm with massage and Pitocin. Labia, perineum, vagina, and cervix were inspected, without evidence of lacerations.   Placenta: 3-vessel cord, intact, sent to L&D Complications: none Lacerations: none EBL: 25 ml Analgesia: none  Infant: viable female  APGARs 9 & 9  weight per medical record  Lynnda Shields, MD OB/GYN Fellow, Faculty Practice

## 2020-01-16 ENCOUNTER — Encounter: Payer: Self-pay | Admitting: *Deleted

## 2020-01-18 ENCOUNTER — Ambulatory Visit: Payer: Medicaid Other | Admitting: *Deleted

## 2020-01-18 ENCOUNTER — Other Ambulatory Visit: Payer: Self-pay

## 2020-01-18 ENCOUNTER — Other Ambulatory Visit: Payer: Self-pay | Admitting: Family Medicine

## 2020-01-18 ENCOUNTER — Ambulatory Visit: Payer: Medicaid Other | Attending: Family Medicine

## 2020-01-18 ENCOUNTER — Telehealth: Payer: Self-pay | Admitting: Obstetrics and Gynecology

## 2020-01-18 ENCOUNTER — Other Ambulatory Visit: Payer: Self-pay | Admitting: *Deleted

## 2020-01-18 ENCOUNTER — Encounter: Payer: Self-pay | Admitting: *Deleted

## 2020-01-18 VITALS — BP 141/85 | HR 82

## 2020-01-18 DIAGNOSIS — Z3A10 10 weeks gestation of pregnancy: Secondary | ICD-10-CM | POA: Diagnosis not present

## 2020-01-18 DIAGNOSIS — Z3201 Encounter for pregnancy test, result positive: Secondary | ICD-10-CM

## 2020-01-18 DIAGNOSIS — O99211 Obesity complicating pregnancy, first trimester: Secondary | ICD-10-CM

## 2020-01-18 DIAGNOSIS — Z6841 Body Mass Index (BMI) 40.0 and over, adult: Secondary | ICD-10-CM | POA: Diagnosis present

## 2020-01-18 DIAGNOSIS — Z363 Encounter for antenatal screening for malformations: Secondary | ICD-10-CM

## 2020-01-18 DIAGNOSIS — O10911 Unspecified pre-existing hypertension complicating pregnancy, first trimester: Secondary | ICD-10-CM

## 2020-01-18 DIAGNOSIS — N83202 Unspecified ovarian cyst, left side: Secondary | ICD-10-CM

## 2020-01-18 NOTE — Telephone Encounter (Signed)
Patient is requesting a RX for  [employer]

## 2020-01-18 NOTE — Progress Notes (Signed)
C/o N/V

## 2020-01-19 MED ORDER — PROMETHAZINE HCL 25 MG PO TABS
25.0000 mg | ORAL_TABLET | Freq: Four times a day (QID) | ORAL | 0 refills | Status: DC | PRN
Start: 2020-01-19 — End: 2020-02-14

## 2020-01-19 NOTE — Telephone Encounter (Signed)
I attempted to reach pt regarding her request for medication to treat nausea. She did not answer and her mailbox was full - unable to leave message. Rx for phenergan sent to pharmacy per standing order.

## 2020-01-26 ENCOUNTER — Other Ambulatory Visit: Payer: Self-pay | Admitting: Family Medicine

## 2020-01-26 DIAGNOSIS — I10 Essential (primary) hypertension: Secondary | ICD-10-CM

## 2020-01-26 DIAGNOSIS — O219 Vomiting of pregnancy, unspecified: Secondary | ICD-10-CM

## 2020-01-26 MED ORDER — LABETALOL HCL 100 MG PO TABS: 100.0000 mg | ORAL_TABLET | Freq: Two times a day (BID) | ORAL | 0 refills | Status: DC

## 2020-01-26 MED ORDER — DOXYLAMINE-PYRIDOXINE 10-10 MG PO TBEC: DELAYED_RELEASE_TABLET | ORAL | 1 refills | Status: DC

## 2020-01-26 NOTE — Progress Notes (Signed)
Call patient to check in after receiving her recent U/S results.  She will be establishing care with OB/GYN next week on 1/26.  During conversation, she reports that she has had significant nausea and vomiting this pregnancy.  She was prescribed Phenergan last week after her U/S appointment, however tried to take it once and it came right back up and has not tried again.  She has not tried anything else for nausea other than ginger which was not helpful.  She has been eating some crackers and more gentle foods.  Denies feeling lightheaded/dizzy or having decreased urination, otherwise has felt well.  Keeping some food/drink down.  Encouraged gentle foods and maintain hydration with small sips of room temperature fluids throughout the day. Rx'd Diclegis to see if this may help as well.  Discussed if she is unable to maintain hydration/p.o. intake, she should be seen at the [facility] for further evaluation.  Allayne Stack

## 2020-01-31 ENCOUNTER — Other Ambulatory Visit: Payer: Self-pay

## 2020-01-31 ENCOUNTER — Encounter: Payer: Self-pay | Admitting: Obstetrics and Gynecology

## 2020-01-31 ENCOUNTER — Ambulatory Visit (INDEPENDENT_AMBULATORY_CARE_PROVIDER_SITE_OTHER): Payer: Medicaid Other | Admitting: Obstetrics and Gynecology

## 2020-01-31 VITALS — BP 147/88 | HR 82 | Wt 284.8 lb

## 2020-01-31 DIAGNOSIS — Z6841 Body Mass Index (BMI) 40.0 and over, adult: Secondary | ICD-10-CM

## 2020-01-31 DIAGNOSIS — Z7189 Other specified counseling: Secondary | ICD-10-CM | POA: Diagnosis not present

## 2020-01-31 DIAGNOSIS — N83202 Unspecified ovarian cyst, left side: Secondary | ICD-10-CM

## 2020-01-31 DIAGNOSIS — O219 Vomiting of pregnancy, unspecified: Secondary | ICD-10-CM

## 2020-01-31 DIAGNOSIS — Z315 Encounter for genetic counseling: Secondary | ICD-10-CM | POA: Diagnosis not present

## 2020-01-31 DIAGNOSIS — O99210 Obesity complicating pregnancy, unspecified trimester: Secondary | ICD-10-CM | POA: Insufficient documentation

## 2020-01-31 DIAGNOSIS — Z8759 Personal history of other complications of pregnancy, childbirth and the puerperium: Secondary | ICD-10-CM | POA: Insufficient documentation

## 2020-01-31 DIAGNOSIS — O0990 Supervision of high risk pregnancy, unspecified, unspecified trimester: Secondary | ICD-10-CM

## 2020-01-31 DIAGNOSIS — O09891 Supervision of other high risk pregnancies, first trimester: Secondary | ICD-10-CM | POA: Diagnosis not present

## 2020-01-31 DIAGNOSIS — N879 Dysplasia of cervix uteri, unspecified: Secondary | ICD-10-CM

## 2020-01-31 DIAGNOSIS — I10 Essential (primary) hypertension: Secondary | ICD-10-CM

## 2020-01-31 HISTORY — DX: Personal history of other complications of pregnancy, childbirth and the puerperium: Z87.59

## 2020-01-31 MED ORDER — ASPIRIN EC 81 MG PO TBEC
81.0000 mg | DELAYED_RELEASE_TABLET | Freq: Every day | ORAL | 1 refills | Status: DC
Start: 2020-01-31 — End: 2020-07-24

## 2020-02-01 ENCOUNTER — Encounter: Payer: Self-pay | Admitting: Obstetrics and Gynecology

## 2020-02-01 DIAGNOSIS — R7989 Other specified abnormal findings of blood chemistry: Secondary | ICD-10-CM

## 2020-02-01 DIAGNOSIS — O361990 Maternal care for other isoimmunization, unspecified trimester, not applicable or unspecified: Secondary | ICD-10-CM | POA: Insufficient documentation

## 2020-02-01 HISTORY — DX: Other specified abnormal findings of blood chemistry: R79.89

## 2020-02-01 LAB — COMPREHENSIVE METABOLIC PANEL
ALT: 17 IU/L (ref 0–32)
AST: 10 IU/L (ref 0–40)
Albumin/Globulin Ratio: 1.2 (ref 1.2–2.2)
Albumin: 3.8 g/dL — ABNORMAL LOW (ref 3.9–5.0)
Alkaline Phosphatase: 111 IU/L (ref 44–121)
BUN/Creatinine Ratio: 9 (ref 9–23)
BUN: 6 mg/dL (ref 6–20)
Bilirubin Total: 0.3 mg/dL (ref 0.0–1.2)
CO2: 21 mmol/L (ref 20–29)
Calcium: 9.7 mg/dL (ref 8.7–10.2)
Chloride: 102 mmol/L (ref 96–106)
Creatinine, Ser: 0.67 mg/dL (ref 0.57–1.00)
GFR calc Af Amer: 136 mL/min/{1.73_m2} (ref >59)
GFR calc non Af Amer: 118 mL/min/{1.73_m2} (ref >59)
Globulin, Total: 3.1 g/dL (ref 1.5–4.5)
Glucose: 93 mg/dL (ref 65–99)
Potassium: 3.6 mmol/L (ref 3.5–5.2)
Sodium: 135 mmol/L (ref 134–144)
Total Protein: 6.9 g/dL (ref 6.0–8.5)

## 2020-02-01 LAB — TSH: TSH: 0.431 u[IU]/mL — ABNORMAL LOW (ref 0.450–4.500)

## 2020-02-01 LAB — PROTEIN / CREATININE RATIO, URINE
Creatinine, Urine: 301.3 mg/dL
Protein, Ur: 25.2 mg/dL
Protein/Creat Ratio: 84 mg/g creat (ref 0–200)

## 2020-02-01 LAB — VITAMIN D 25 HYDROXY (VIT D DEFICIENCY, FRACTURES): Vit D, 25-Hydroxy: 14.6 ng/mL — ABNORMAL LOW (ref 30.0–100.0)

## 2020-02-01 LAB — HEMOGLOBIN A1C
Est. average glucose Bld gHb Est-mCnc: 100 mg/dL
Hgb A1c MFr Bld: 5.1 % (ref 4.8–5.6)

## 2020-02-01 MED ORDER — VITAMIN D (ERGOCALCIFEROL) 1.25 MG (50000 UNIT) PO CAPS: 50000.0000 [IU] | ORAL_CAPSULE | ORAL | 0 refills | Status: AC

## 2020-02-01 NOTE — Progress Notes (Signed)
Prenatal Visit Note Date: 01/31/2020 Clinic: [facility]  Transfer of care visit from [facility] due to chronic HTN  Subjective:  Carol Blair is a 30 y.o. [phone] at [redacted]w[redacted]d being seen today for ongoing prenatal care.  She is currently monitored for the following issues for this high-risk pregnancy and has Hypertension; Cervical dysplasia; Supervision of high risk pregnancy, antepartum; Preop cardiovascular exam; Chronic headaches; First trimester pregnancy; Chronic hypertension; BMI 40.0-44.9, adult (HCC); Obesity in pregnancy; and History of severe pre-eclampsia on their problem list.  Patient reports mild nausea of pregnancy.   Contractions: Not present. Vag. Bleeding: None.  Movement: Absent. Denies leaking of fluid.   The following portions of the patient's history were reviewed and updated as appropriate: allergies, current medications, past family history, past medical history, past social history, past surgical history and problem list. Problem list updated.  Objective:   Vitals:   01/31/20 1537  BP: (!) 147/88  Pulse: 82  Weight: 284 lb 12.8 oz (129.2 kg)    Fetal Status: Fetal Heart Rate (bpm): 162   Movement: Absent     General:  Alert, oriented and cooperative. Patient is in no acute distress.  Skin: Skin is warm and dry. No rash noted.   Cardiovascular: Normal heart rate noted  Respiratory: Normal respiratory effort, no problems with respiration noted  Abdomen: Soft, gravid, appropriate for gestational age. Pain/Pressure: Present     Pelvic:  Cervical exam deferred        Extremities: Normal range of motion.  Edema: None  Mental Status: Normal mood and affect. Normal behavior. Normal judgment and thought content.   Urinalysis:      Assessment and Plan:  Pregnancy: [id number] at [redacted]w[redacted]d  1. Chronic hypertension Continue on labetalol 100 bid; pt was on 200 bid but made her feel bad. Plan of care d/w her  2. BMI 40.0-44.9,  adult (HCC)  3. Obesity in pregnancy Continue to follow  4. History of severe pre-eclampsia Confirms on low dose ASA. Baseline labs today  5. Supervision of high risk pregnancy, antepartum Offer afp nv  COVID-19 Vaccine Counseling: The patient was counseled on the potential benefits and lack of known risks of COVID vaccination, during pregnancy and breastfeeding, during today's visit. The patient's questions and concerns were addressed today, including safety of the vaccination and potential side effects as they have been published by ACOG and [facility]. The patient has been informed that there have not been any documented vaccine related injuries, deaths or birth defects to infant or mom after receiving the COVID-19 vaccine to date. The patient has been made aware that although she is not at increased risk of contracting COVID-19 during pregnancy, she is at increased risk of developing severe disease and complications if she contracts COVID-19 while pregnant. All patient questions were addressed during our visit today. The patient is not planning to get vaccinated at this time.  - Hemoglobin A1c - Comprehensive metabolic panel - TSH - Protein / creatinine ratio, urine - [place] MFM OB DETAIL +14 WK; Future - CHL AMB BABYSCRIPTS SCHEDULE OPTIMIZATION - Cytology - PAP( [place]) - Genetic Screening - VITAMIN D 25 Hydroxy (Vit-D Deficiency, Fractures)  6. Cervical dysplasia Pap today  7. Nausea and vomiting in pregnancy Getting better. Continue diclegis  8. LO cyst 8cm, simple appearing on 1/13 mfm ultrasound. Continue to follow  Preterm labor symptoms and general obstetric precautions including but not limited to vaginal bleeding, contractions, leaking of fluid and fetal movement  were reviewed in detail with the patient. Please refer to After Visit Summary for other counseling recommendations.  Return in about 1 month (around 03/02/2020) for in person or virtual, md visit.   [place] Bing, MD

## 2020-02-01 NOTE — Addendum Note (Signed)
Addended by: Cleona Bing on: 02/01/2020 09:58 PM   Modules accepted: Orders

## 2020-02-05 NOTE — Addendum Note (Signed)
Addended by: Jill Side on: 02/05/2020 11:20 AM   Modules accepted: Orders

## 2020-02-07 LAB — SPECIMEN STATUS REPORT

## 2020-02-07 LAB — T4, FREE: Free T4: 1.09 ng/dL (ref 0.82–1.77)

## 2020-02-08 LAB — CYTOLOGY - PAP
Chlamydia: NEGATIVE
Comment: NEGATIVE
Comment: NEGATIVE
Comment: NEGATIVE
Comment: NEGATIVE
Comment: NORMAL
Diagnosis: HIGH — AB
HPV 16: NEGATIVE
HPV 18 / 45: NEGATIVE
High risk HPV: POSITIVE — AB
Neisseria Gonorrhea: NEGATIVE
Trichomonas: NEGATIVE

## 2020-02-14 ENCOUNTER — Other Ambulatory Visit: Payer: Self-pay

## 2020-02-14 MED ORDER — PROMETHAZINE HCL 25 MG PO TABS: 25.0000 mg | ORAL_TABLET | Freq: Four times a day (QID) | ORAL | 0 refills | Status: DC | PRN

## 2020-02-26 ENCOUNTER — Other Ambulatory Visit: Payer: Self-pay | Admitting: *Deleted

## 2020-02-26 DIAGNOSIS — I10 Essential (primary) hypertension: Secondary | ICD-10-CM

## 2020-02-26 MED ORDER — LABETALOL HCL 100 MG PO TABS: 100.0000 mg | ORAL_TABLET | Freq: Two times a day (BID) | ORAL | 0 refills | Status: DC

## 2020-02-28 ENCOUNTER — Encounter: Payer: Self-pay | Admitting: Family Medicine

## 2020-02-28 ENCOUNTER — Telehealth (INDEPENDENT_AMBULATORY_CARE_PROVIDER_SITE_OTHER): Payer: Medicaid Other | Admitting: Family Medicine

## 2020-02-28 DIAGNOSIS — O09292 Supervision of pregnancy with other poor reproductive or obstetric history, second trimester: Secondary | ICD-10-CM

## 2020-02-28 DIAGNOSIS — E669 Obesity, unspecified: Secondary | ICD-10-CM

## 2020-02-28 DIAGNOSIS — O361920 Maternal care for other isoimmunization, second trimester, not applicable or unspecified: Secondary | ICD-10-CM

## 2020-02-28 DIAGNOSIS — N879 Dysplasia of cervix uteri, unspecified: Secondary | ICD-10-CM

## 2020-02-28 DIAGNOSIS — O99210 Obesity complicating pregnancy, unspecified trimester: Secondary | ICD-10-CM

## 2020-02-28 DIAGNOSIS — Z8759 Personal history of other complications of pregnancy, childbirth and the puerperium: Secondary | ICD-10-CM

## 2020-02-28 DIAGNOSIS — N83202 Unspecified ovarian cyst, left side: Secondary | ICD-10-CM

## 2020-02-28 DIAGNOSIS — O99891 Other specified diseases and conditions complicating pregnancy: Secondary | ICD-10-CM

## 2020-02-28 DIAGNOSIS — O0990 Supervision of high risk pregnancy, unspecified, unspecified trimester: Secondary | ICD-10-CM

## 2020-02-28 DIAGNOSIS — N83209 Unspecified ovarian cyst, unspecified side: Secondary | ICD-10-CM | POA: Insufficient documentation

## 2020-02-28 DIAGNOSIS — I10 Essential (primary) hypertension: Secondary | ICD-10-CM

## 2020-02-28 DIAGNOSIS — O10912 Unspecified pre-existing hypertension complicating pregnancy, second trimester: Secondary | ICD-10-CM

## 2020-02-28 DIAGNOSIS — O99212 Obesity complicating pregnancy, second trimester: Secondary | ICD-10-CM

## 2020-02-28 DIAGNOSIS — O99352 Diseases of the nervous system complicating pregnancy, second trimester: Secondary | ICD-10-CM

## 2020-02-28 DIAGNOSIS — Z3A16 16 weeks gestation of pregnancy: Secondary | ICD-10-CM

## 2020-02-28 DIAGNOSIS — R7989 Other specified abnormal findings of blood chemistry: Secondary | ICD-10-CM

## 2020-02-28 DIAGNOSIS — G44229 Chronic tension-type headache, not intractable: Secondary | ICD-10-CM

## 2020-02-28 MED ORDER — MECLIZINE HCL 12.5 MG PO TABS
12.5000 mg | ORAL_TABLET | Freq: Three times a day (TID) | ORAL | 0 refills | Status: DC | PRN
Start: 2020-02-28 — End: 2020-03-27

## 2020-02-28 MED ORDER — ONDANSETRON 4 MG PO TBDP: 4.0000 mg | ORAL_TABLET | Freq: Three times a day (TID) | ORAL | 0 refills | Status: DC | PRN

## 2020-02-28 MED ORDER — SCOPOLAMINE 1 MG/3DAYS TD PT72
1.0000 | MEDICATED_PATCH | TRANSDERMAL | 12 refills | Status: DC
Start: 2020-02-28 — End: 2020-03-27

## 2020-02-28 NOTE — Progress Notes (Signed)
I connected with  Carol Blair on 02/28/20 at 1118 by MyChart and verified that I am speaking with the correct person using two identifiers.   I discussed the limitations, risks, security and privacy concerns of performing an evaluation and management service by telephone and the availability of in person appointments. I also discussed with the patient that there may be a patient responsible charge related to this service. The patient expressed understanding and agreed to proceed.  Pt needs new batteries for BP cuff, pt will replace batteries and record in [facility]. Reports headache last Saturday-Sunday, states BP was in normal range, BP cuff working at that time. Denies any other s/s of elevated BP.  Marjo Bicker, RN 02/28/2020  11:18 AM

## 2020-02-28 NOTE — Patient Instructions (Signed)
 Contraception Choices Contraception, also called birth control, refers to methods or devices that prevent pregnancy. Hormonal methods Contraceptive implant A contraceptive implant is a thin, plastic tube that contains a hormone that prevents pregnancy. It is different from an intrauterine device (IUD). It is inserted into the upper part of the arm by a health care provider. Implants can be effective for up to 3 years. Progestin-only injections Progestin-only injections are injections of progestin, a synthetic form of the hormone progesterone. They are given every 3 months by a health care provider. Birth control pills Birth control pills are pills that contain hormones that prevent pregnancy. They must be taken once a day, preferably at the same time each day. A prescription is needed to use this method of contraception. Birth control patch The birth control patch contains hormones that prevent pregnancy. It is placed on the skin and must be changed once a week for three weeks and removed on the fourth week. A prescription is needed to use this method of contraception. Vaginal ring A vaginal ring contains hormones that prevent pregnancy. It is placed in the vagina for three weeks and removed on the fourth week. After that, the process is repeated with a new ring. A prescription is needed to use this method of contraception. Emergency contraceptive Emergency contraceptives prevent pregnancy after unprotected sex. They come in pill form and can be taken up to 5 days after sex. They work best the sooner they are taken after having sex. Most emergency contraceptives are available without a prescription. This method should not be used as your only form of birth control.   Barrier methods Female condom A female condom is a thin sheath that is worn over the penis during sex. Condoms keep sperm from going inside a woman's body. They can be used with a sperm-killing substance (spermicide) to increase their  effectiveness. They should be thrown away after one use. Female condom A female condom is a soft, loose-fitting sheath that is put into the vagina before sex. The condom keeps sperm from going inside a woman's body. They should be thrown away after one use. Diaphragm A diaphragm is a soft, dome-shaped barrier. It is inserted into the vagina before sex, along with a spermicide. The diaphragm blocks sperm from entering the uterus, and the spermicide kills sperm. A diaphragm should be left in the vagina for 6-8 hours after sex and removed within 24 hours. A diaphragm is prescribed and fitted by a health care provider. A diaphragm should be replaced every 1-2 years, after giving birth, after gaining more than 15 lb (6.8 kg), and after pelvic surgery. Cervical cap A cervical cap is a round, soft latex or plastic cup that fits over the cervix. It is inserted into the vagina before sex, along with spermicide. It blocks sperm from entering the uterus. The cap should be left in place for 6-8 hours after sex and removed within 48 hours. A cervical cap must be prescribed and fitted by a health care provider. It should be replaced every 2 years. Sponge A sponge is a soft, circular piece of polyurethane foam with spermicide in it. The sponge helps block sperm from entering the uterus, and the spermicide kills sperm. To use it, you make it wet and then insert it into the vagina. It should be inserted before sex, left in for at least 6 hours after sex, and removed and thrown away within 30 hours. Spermicides Spermicides are chemicals that kill or block sperm from entering the   cervix and uterus. They can come as a cream, jelly, suppository, foam, or tablet. A spermicide should be inserted into the vagina with an applicator at least 10-15 minutes before sex to allow time for it to work. The process must be repeated every time you have sex. Spermicides do not require a prescription.   Intrauterine  contraception Intrauterine device (IUD) An IUD is a T-shaped device that is put in a woman's uterus. There are two types:  Hormone IUD.This type contains progestin, a synthetic form of the hormone progesterone. This type can stay in place for 3-5 years.  Copper IUD.This type is wrapped in copper wire. It can stay in place for 10 years. Permanent methods of contraception Female tubal ligation In this method, a woman's fallopian tubes are sealed, tied, or blocked during surgery to prevent eggs from traveling to the uterus. Hysteroscopic sterilization In this method, a small, flexible insert is placed into each fallopian tube. The inserts cause scar tissue to form in the fallopian tubes and block them, so sperm cannot reach an egg. The procedure takes about 3 months to be effective. Another form of birth control must be used during those 3 months. Female sterilization This is a procedure to tie off the tubes that carry sperm (vasectomy). After the procedure, the man can still ejaculate fluid (semen). Another form of birth control must be used for 3 months after the procedure. Natural planning methods Natural family planning In this method, a couple does not have sex on days when the woman could become pregnant. Calendar method In this method, the woman keeps track of the length of each menstrual cycle, identifies the days when pregnancy can happen, and does not have sex on those days. Ovulation method In this method, a couple avoids sex during ovulation. Symptothermal method This method involves not having sex during ovulation. The woman typically checks for ovulation by watching changes in her temperature and in the consistency of cervical mucus. Post-ovulation method In this method, a couple waits to have sex until after ovulation. Where to find more information  Centers for Disease Control and Prevention: www.cdc.gov Summary  Contraception, also called birth control, refers to methods or  devices that prevent pregnancy.  Hormonal methods of contraception include implants, injections, pills, patches, vaginal rings, and emergency contraceptives.  Barrier methods of contraception can include female condoms, female condoms, diaphragms, cervical caps, sponges, and spermicides.  There are two types of IUDs (intrauterine devices). An IUD can be put in a woman's uterus to prevent pregnancy for 3-5 years.  Permanent sterilization can be done through a procedure for males and females. Natural family planning methods involve nothaving sex on days when the woman could become pregnant. This information is not intended to replace advice given to you by your health care provider. Make sure you discuss any questions you have with your health care provider. Document Revised: 05/29/2019 Document Reviewed: 05/29/2019 Elsevier Patient Education  2021 [employer]   Breastfeeding  Choosing to breastfeed is one of the best decisions you can make for yourself and your baby. A change in hormones during pregnancy causes your breasts to make breast milk in your milk-producing glands. Hormones prevent breast milk from being released before your baby is born. They also prompt milk flow after birth. Once breastfeeding has begun, thoughts of your baby, as well as his or her sucking or crying, can stimulate the release of milk from your milk-producing glands. Benefits of breastfeeding Research shows that breastfeeding offers many health benefits   for infants and mothers. It also offers a cost-free and convenient way to feed your baby. For your baby  Your first milk (colostrum) helps your baby's digestive system to function better.  Special cells in your milk (antibodies) help your baby to fight off infections.  Breastfed babies are less likely to develop asthma, allergies, obesity, or type 2 diabetes. They are also at lower risk for sudden infant death syndrome (SIDS).  Nutrients in breast milk are better  able to meet your baby's needs compared to infant formula.  Breast milk improves your baby's brain development. For you  Breastfeeding helps to create a very special bond between you and your baby.  Breastfeeding is convenient. Breast milk costs nothing and is always available at the correct temperature.  Breastfeeding helps to burn calories. It helps you to lose the weight that you gained during pregnancy.  Breastfeeding makes your uterus return faster to its size before pregnancy. It also slows bleeding (lochia) after you give birth.  Breastfeeding helps to lower your risk of developing type 2 diabetes, osteoporosis, rheumatoid arthritis, cardiovascular disease, and breast, ovarian, uterine, and endometrial cancer later in life. Breastfeeding basics Starting breastfeeding  Find a comfortable place to sit or lie down, with your neck and back well-supported.  Place a pillow or a rolled-up blanket under your baby to bring him or her to the level of your breast (if you are seated). Nursing pillows are specially designed to help support your arms and your baby while you breastfeed.  Make sure that your baby's tummy (abdomen) is facing your abdomen.  Gently massage your breast. With your fingertips, massage from the outer edges of your breast inward toward the nipple. This encourages milk flow. If your milk flows slowly, you may need to continue this action during the feeding.  Support your breast with 4 fingers underneath and your thumb above your nipple (make the letter "C" with your hand). Make sure your fingers are well away from your nipple and your baby's mouth.  Stroke your baby's lips gently with your finger or nipple.  When your baby's mouth is open wide enough, quickly bring your baby to your breast, placing your entire nipple and as much of the areola as possible into your baby's mouth. The areola is the colored area around your nipple. ? More areola should be visible above your  baby's upper lip than below the lower lip. ? Your baby's lips should be opened and extended outward (flanged) to ensure an adequate, comfortable latch. ? Your baby's tongue should be between his or her lower gum and your breast.  Make sure that your baby's mouth is correctly positioned around your nipple (latched). Your baby's lips should create a seal on your breast and be turned out (everted).  It is common for your baby to suck about 2-3 minutes in order to start the flow of breast milk. Latching Teaching your baby how to latch onto your breast properly is very important. An improper latch can cause nipple pain, decreased milk supply, and poor weight gain in your baby. Also, if your baby is not latched onto your nipple properly, he or she may swallow some air during feeding. This can make your baby fussy. Burping your baby when you switch breasts during the feeding can help to get rid of the air. However, teaching your baby to latch on properly is still the best way to prevent fussiness from swallowing air while breastfeeding. Signs that your baby has successfully latched onto   your nipple  Silent tugging or silent sucking, without causing you pain. Infant's lips should be extended outward (flanged).  Swallowing heard between every 3-4 sucks once your milk has started to flow (after your let-down milk reflex occurs).  Muscle movement above and in front of his or her ears while sucking. Signs that your baby has not successfully latched onto your nipple  Sucking sounds or smacking sounds from your baby while breastfeeding.  Nipple pain. If you think your baby has not latched on correctly, slip your finger into the corner of your baby's mouth to break the suction and place it between your baby's gums. Attempt to start breastfeeding again. Signs of successful breastfeeding Signs from your baby  Your baby will gradually decrease the number of sucks or will completely stop sucking.  Your baby  will fall asleep.  Your baby's body will relax.  Your baby will retain a small amount of milk in his or her mouth.  Your baby will let go of your breast by himself or herself. Signs from you  Breasts that have increased in firmness, weight, and size 1-3 hours after feeding.  Breasts that are softer immediately after breastfeeding.  Increased milk volume, as well as a change in milk consistency and color by the fifth day of breastfeeding.  Nipples that are not sore, cracked, or bleeding. Signs that your baby is getting enough milk  Wetting at least 1-2 diapers during the first 24 hours after birth.  Wetting at least 5-6 diapers every 24 hours for the first week after birth. The urine should be clear or pale yellow by the age of 5 days.  Wetting 6-8 diapers every 24 hours as your baby continues to grow and develop.  At least 3 stools in a 24-hour period by the age of 5 days. The stool should be soft and yellow.  At least 3 stools in a 24-hour period by the age of 7 days. The stool should be seedy and yellow.  No loss of weight greater than 10% of birth weight during the first 3 days of life.  Average weight gain of 4-7 oz (113-198 g) per week after the age of 4 days.  Consistent daily weight gain by the age of 5 days, without weight loss after the age of 2 weeks. After a feeding, your baby may spit up a small amount of milk. This is normal. Breastfeeding frequency and duration Frequent feeding will help you make more milk and can prevent sore nipples and extremely full breasts (breast engorgement). Breastfeed when you feel the need to reduce the fullness of your breasts or when your baby shows signs of hunger. This is called "breastfeeding on demand." Signs that your baby is hungry include:  Increased alertness, activity, or restlessness.  Movement of the head from side to side.  Opening of the mouth when the corner of the mouth or cheek is stroked (rooting).  Increased  sucking sounds, smacking lips, cooing, sighing, or squeaking.  Hand-to-mouth movements and sucking on fingers or hands.  Fussing or crying. Avoid introducing a pacifier to your baby in the first 4-6 weeks after your baby is born. After this time, you may choose to use a pacifier. Research has shown that pacifier use during the first year of a baby's life decreases the risk of sudden infant death syndrome (SIDS). Allow your baby to feed on each breast as long as he or she wants. When your baby unlatches or falls asleep while feeding from the   first breast, offer the second breast. Because newborns are often sleepy in the first few weeks of life, you may need to awaken your baby to get him or her to feed. Breastfeeding times will vary from baby to baby. However, the following rules can serve as a guide to help you make sure that your baby is properly fed:  Newborns (babies 4 weeks of age or younger) may breastfeed every 1-3 hours.  Newborns should not go without breastfeeding for longer than 3 hours during the day or 5 hours during the night.  You should breastfeed your baby a minimum of 8 times in a 24-hour period. Breast milk pumping Pumping and storing breast milk allows you to make sure that your baby is exclusively fed your breast milk, even at times when you are unable to breastfeed. This is especially important if you go back to work while you are still breastfeeding, or if you are not able to be present during feedings. Your lactation consultant can help you find a method of pumping that works best for you and give you guidelines about how long it is safe to store breast milk.      Caring for your breasts while you breastfeed Nipples can become dry, cracked, and sore while breastfeeding. The following recommendations can help keep your breasts moisturized and healthy:  Avoid using soap on your nipples.  Wear a supportive bra designed especially for nursing. Avoid wearing underwire-style  bras or extremely tight bras (sports bras).  Air-dry your nipples for 3-4 minutes after each feeding.  Use only cotton bra pads to absorb leaked breast milk. Leaking of breast milk between feedings is normal.  Use lanolin on your nipples after breastfeeding. Lanolin helps to maintain your skin's normal moisture barrier. Pure lanolin is not harmful (not toxic) to your baby. You may also hand express a few drops of breast milk and gently massage that milk into your nipples and allow the milk to air-dry. In the first few weeks after giving birth, some women experience breast engorgement. Engorgement can make your breasts feel heavy, warm, and tender to the touch. Engorgement peaks within 3-5 days after you give birth. The following recommendations can help to ease engorgement:  Completely empty your breasts while breastfeeding or pumping. You may want to start by applying warm, moist heat (in the shower or with warm, water-soaked hand towels) just before feeding or pumping. This increases circulation and helps the milk flow. If your baby does not completely empty your breasts while breastfeeding, pump any extra milk after he or she is finished.  Apply ice packs to your breasts immediately after breastfeeding or pumping, unless this is too uncomfortable for you. To do this: ? Put ice in a plastic bag. ? Place a towel between your skin and the bag. ? Leave the ice on for 20 minutes, 2-3 times a day.  Make sure that your baby is latched on and positioned properly while breastfeeding. If engorgement persists after 48 hours of following these recommendations, contact your health care provider or a lactation consultant. Overall health care recommendations while breastfeeding  Eat 3 healthy meals and 3 snacks every day. Well-nourished mothers who are breastfeeding need an additional 450-500 calories a day. You can meet this requirement by increasing the amount of a balanced diet that you eat.  Drink  enough water to keep your urine pale yellow or clear.  Rest often, relax, and continue to take your prenatal vitamins to prevent fatigue, stress, and low   vitamin and mineral levels in your body (nutrient deficiencies).  Do not use any products that contain nicotine or tobacco, such as cigarettes and e-cigarettes. Your baby may be harmed by chemicals from cigarettes that pass into breast milk and exposure to secondhand smoke. If you need help quitting, ask your health care provider.  Avoid alcohol.  Do not use illegal drugs or marijuana.  Talk with your health care provider before taking any medicines. These include over-the-counter and prescription medicines as well as vitamins and herbal supplements. Some medicines that may be harmful to your baby can pass through breast milk.  It is possible to become pregnant while breastfeeding. If birth control is desired, ask your health care provider about options that will be safe while breastfeeding your baby. Where to find more information: [employer]: www.llli.org Contact a health care provider if:  You feel like you want to stop breastfeeding or have become frustrated with breastfeeding.  Your nipples are cracked or bleeding.  Your breasts are red, tender, or warm.  You have: ? Painful breasts or nipples. ? A swollen area on either breast. ? A fever or chills. ? Nausea or vomiting. ? Drainage other than breast milk from your nipples.  Your breasts do not become full before feedings by the fifth day after you give birth.  You feel sad and depressed.  Your baby is: ? Too sleepy to eat well. ? Having trouble sleeping. ? More than 1 week old and wetting fewer than 6 diapers in a 24-hour period. ? Not gaining weight by 5 days of age.  Your baby has fewer than 3 stools in a 24-hour period.  Your baby's skin or the white parts of his or her eyes become yellow. Get help right away if:  Your baby is overly tired  (lethargic) and does not want to wake up and feed.  Your baby develops an unexplained fever. Summary  Breastfeeding offers many health benefits for infant and mothers.  Try to breastfeed your infant when he or she shows early signs of hunger.  Gently tickle or stroke your baby's lips with your finger or nipple to allow the baby to open his or her mouth. Bring the baby to your breast. Make sure that much of the areola is in your baby's mouth. Offer one side and burp the baby before you offer the other side.  Talk with your health care provider or lactation consultant if you have questions or you face problems as you breastfeed. This information is not intended to replace advice given to you by your health care provider. Make sure you discuss any questions you have with your health care provider. Document Revised: 03/18/2017 Document Reviewed: 01/24/2016 Elsevier Patient Education  2021 [employer]  

## 2020-02-28 NOTE — Progress Notes (Signed)
I connected with Carol Blair 02/28/20 at 10:55 AM EST by: MyChart video and verified that I am speaking with the correct person using two identifiers.  Patient is located at [facility] and provider is located at home.     The purpose of this virtual visit is to provide medical care while limiting exposure to the novel coronavirus. I discussed the limitations, risks, security and privacy concerns of performing an evaluation and management service by MyChart video and the availability of in person appointments. I also discussed with the patient that there may be a patient responsible charge related to this service. By engaging in this virtual visit, you consent to the provision of healthcare.  Additionally, you authorize for your insurance to be billed for the services provided during this visit.  The patient expressed understanding and agreed to proceed.  The following staff members participated in the virtual visit:  Venora Maples, MD/MPH    PRENATAL VISIT NOTE  Subjective:  Carol Blair is a 30 y.o. G3O7564 at [redacted]w[redacted]d  for phone visit for ongoing prenatal care.  She is currently monitored for the following issues for this high-risk pregnancy and has Hypertension; Cervical dysplasia; Supervision of high risk pregnancy, antepartum; Preop cardiovascular exam; Chronic headaches; First trimester pregnancy; Chronic hypertension; BMI 40.0-44.9, adult (HCC); Obesity in pregnancy; History of severe pre-eclampsia; Low TSH level; Low serum vitamin D; and Lewis isoimmunization during pregnancy on their problem list.  Patient reports nausea.  Contractions: Not present. Vag. Bleeding: None.  Movement: Absent. Denies leaking of fluid.   The following portions of the patient's history were reviewed and updated as appropriate: allergies, current medications, past family history, past medical history, past social history, past surgical history and problem list.   Objective:  There were no vitals  filed for this visit. Self-Obtained  Fetal Status:     Movement: Absent     Assessment and Plan:  Pregnancy: G5P3013 at [redacted]w[redacted]d 1. Supervision of high risk pregnancy, antepartum BP cuff malfunctioning, will get it fixed Not yet feeling movement  2. Obesity in pregnancy   3. Low TSH level T4 normal last month  4. 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 History of severe pre-eclampsia On baby aspirin  6. Chronic tension-type headache, not intractable   7. Cervical dysplasia Pap done at last visit (01/31/20) with HSIL, HPV positive though negative 16/18/45 Discussed she will need colpo, only biopsy if frankly cancerous lesions are seen  8. Chronic hypertension On labetalol 100 mg BID, baby aspirin  Preterm labor symptoms and general obstetric precautions including but not limited to vaginal bleeding, contractions, leaking of fluid and fetal movement were reviewed in detail with the patient.  Return in 2 weeks (on 03/13/2020) for colpo.  Future Appointments  Date Time Provider Department Center  03/14/2020  2:30 PM [facility] NURSE [facility]  03/14/2020  2:45 PM [facility] US4 [facility]     Time spent on virtual visit: 12 minutes  Venora Maples, MD

## 2020-03-01 ENCOUNTER — Encounter: Payer: Self-pay | Admitting: *Deleted

## 2020-03-13 ENCOUNTER — Encounter: Payer: Self-pay | Admitting: *Deleted

## 2020-03-14 ENCOUNTER — Other Ambulatory Visit: Payer: Self-pay

## 2020-03-14 ENCOUNTER — Encounter: Payer: Self-pay | Admitting: *Deleted

## 2020-03-14 ENCOUNTER — Ambulatory Visit: Payer: Medicaid Other | Admitting: *Deleted

## 2020-03-14 ENCOUNTER — Ambulatory Visit: Payer: Medicaid Other | Attending: Obstetrics and Gynecology

## 2020-03-14 VITALS — BP 134/67 | HR 72

## 2020-03-14 DIAGNOSIS — O10912 Unspecified pre-existing hypertension complicating pregnancy, second trimester: Secondary | ICD-10-CM

## 2020-03-14 DIAGNOSIS — Z3A18 18 weeks gestation of pregnancy: Secondary | ICD-10-CM | POA: Diagnosis not present

## 2020-03-14 DIAGNOSIS — O321XX0 Maternal care for breech presentation, not applicable or unspecified: Secondary | ICD-10-CM

## 2020-03-14 DIAGNOSIS — O10012 Pre-existing essential hypertension complicating pregnancy, second trimester: Secondary | ICD-10-CM

## 2020-03-14 DIAGNOSIS — N83202 Unspecified ovarian cyst, left side: Secondary | ICD-10-CM | POA: Diagnosis not present

## 2020-03-14 DIAGNOSIS — E669 Obesity, unspecified: Secondary | ICD-10-CM

## 2020-03-14 DIAGNOSIS — Z363 Encounter for antenatal screening for malformations: Secondary | ICD-10-CM | POA: Diagnosis not present

## 2020-03-14 DIAGNOSIS — O99212 Obesity complicating pregnancy, second trimester: Secondary | ICD-10-CM

## 2020-03-15 ENCOUNTER — Ambulatory Visit (INDEPENDENT_AMBULATORY_CARE_PROVIDER_SITE_OTHER): Payer: Medicaid Other | Admitting: Family Medicine

## 2020-03-15 ENCOUNTER — Encounter: Payer: Self-pay | Admitting: Family Medicine

## 2020-03-15 ENCOUNTER — Other Ambulatory Visit: Payer: Self-pay | Admitting: *Deleted

## 2020-03-15 DIAGNOSIS — O0990 Supervision of high risk pregnancy, unspecified, unspecified trimester: Secondary | ICD-10-CM

## 2020-03-15 DIAGNOSIS — O10912 Unspecified pre-existing hypertension complicating pregnancy, second trimester: Secondary | ICD-10-CM

## 2020-03-15 NOTE — Progress Notes (Signed)
    GYNECOLOGY CLINIC COLPOSCOPY PROCEDURE NOTE  30 y.o. J3H5456 here for colposcopy for  high-grade squamous intraepithelial neoplasia  (HGSIL-encompassing moderate and severe dysplasia) pap smear on 01/31/2020. Discussed role for HPV in cervical dysplasia, need for surveillance, nature of the procedure, and risks and benefits.  Patient currently [redacted] weeks pregnant.  Patient given informed consent, signed copy in the chart, time out was performed.    Lab Results  Component Value Date   PREGTESTUR Positive (A) 12/20/2019   HCG 11.62 08/15/2015   HCGQUANT 7 09/12/2019    Allergies  Allergen Reactions  . Codeine Nausea And Vomiting and Other (See Comments)    Shaking, sweating    Placed in lithotomy position. Cervix viewed with speculum and colposcope after application of acetic acid.   Colposcopy Adequacy Cervix fully visualized: Yes  SCJ fully visualized: No unable to see inferior portion  Colposcopy Findings no visible lesions  Corresponding were not biopsies obtained.    ECC specimen was not obtained.  All specimens were labeled and sent to pathology.  Hemostatic measures: None  Complications: none  Patient tolerated the procedure well.  OBGyn Exam  Colposcopy Impressions Normal  Plan Unable to fully visualize SCJ and no concerning lesions seen, concern for endocervical lesion. Will need PP colpo with ECC   Venora Maples, MD/MPH Attending Family Medicine Physician, [facility] for [facility], [facility]

## 2020-03-21 ENCOUNTER — Encounter: Payer: Self-pay | Admitting: *Deleted

## 2020-03-27 ENCOUNTER — Ambulatory Visit (INDEPENDENT_AMBULATORY_CARE_PROVIDER_SITE_OTHER): Payer: Medicaid Other | Admitting: Family Medicine

## 2020-03-27 ENCOUNTER — Other Ambulatory Visit: Payer: Self-pay

## 2020-03-27 ENCOUNTER — Other Ambulatory Visit: Payer: Self-pay | Admitting: *Deleted

## 2020-03-27 VITALS — BP 138/85 | HR 78 | Wt 287.3 lb

## 2020-03-27 DIAGNOSIS — Z8759 Personal history of other complications of pregnancy, childbirth and the puerperium: Secondary | ICD-10-CM

## 2020-03-27 DIAGNOSIS — I10 Essential (primary) hypertension: Secondary | ICD-10-CM

## 2020-03-27 DIAGNOSIS — O361920 Maternal care for other isoimmunization, second trimester, not applicable or unspecified: Secondary | ICD-10-CM

## 2020-03-27 DIAGNOSIS — N879 Dysplasia of cervix uteri, unspecified: Secondary | ICD-10-CM

## 2020-03-27 DIAGNOSIS — G44229 Chronic tension-type headache, not intractable: Secondary | ICD-10-CM

## 2020-03-27 DIAGNOSIS — Z6841 Body Mass Index (BMI) 40.0 and over, adult: Secondary | ICD-10-CM

## 2020-03-27 DIAGNOSIS — O0990 Supervision of high risk pregnancy, unspecified, unspecified trimester: Secondary | ICD-10-CM

## 2020-03-27 MED ORDER — ONDANSETRON HCL 4 MG PO TABS: 4.0000 mg | ORAL_TABLET | Freq: Three times a day (TID) | ORAL | 2 refills | Status: DC | PRN

## 2020-03-27 MED ORDER — PROMETHAZINE HCL 25 MG PO TABS: 25.0000 mg | ORAL_TABLET | Freq: Four times a day (QID) | ORAL | 2 refills | Status: DC | PRN

## 2020-03-27 MED ORDER — LABETALOL HCL 100 MG PO TABS: 100.0000 mg | ORAL_TABLET | Freq: Two times a day (BID) | ORAL | 0 refills | Status: DC

## 2020-03-27 NOTE — Patient Instructions (Signed)
 Contraception Choices Contraception, also called birth control, refers to methods or devices that prevent pregnancy. Hormonal methods Contraceptive implant A contraceptive implant is a thin, plastic tube that contains a hormone that prevents pregnancy. It is different from an intrauterine device (IUD). It is inserted into the upper part of the arm by a health care provider. Implants can be effective for up to 3 years. Progestin-only injections Progestin-only injections are injections of progestin, a synthetic form of the hormone progesterone. They are given every 3 months by a health care provider. Birth control pills Birth control pills are pills that contain hormones that prevent pregnancy. They must be taken once a day, preferably at the same time each day. A prescription is needed to use this method of contraception. Birth control patch The birth control patch contains hormones that prevent pregnancy. It is placed on the skin and must be changed once a week for three weeks and removed on the fourth week. A prescription is needed to use this method of contraception. Vaginal ring A vaginal ring contains hormones that prevent pregnancy. It is placed in the vagina for three weeks and removed on the fourth week. After that, the process is repeated with a new ring. A prescription is needed to use this method of contraception. Emergency contraceptive Emergency contraceptives prevent pregnancy after unprotected sex. They come in pill form and can be taken up to 5 days after sex. They work best the sooner they are taken after having sex. Most emergency contraceptives are available without a prescription. This method should not be used as your only form of birth control.   Barrier methods Female condom A female condom is a thin sheath that is worn over the penis during sex. Condoms keep sperm from going inside a woman's body. They can be used with a sperm-killing substance (spermicide) to increase their  effectiveness. They should be thrown away after one use. Female condom A female condom is a soft, loose-fitting sheath that is put into the vagina before sex. The condom keeps sperm from going inside a woman's body. They should be thrown away after one use. Diaphragm A diaphragm is a soft, dome-shaped barrier. It is inserted into the vagina before sex, along with a spermicide. The diaphragm blocks sperm from entering the uterus, and the spermicide kills sperm. A diaphragm should be left in the vagina for 6-8 hours after sex and removed within 24 hours. A diaphragm is prescribed and fitted by a health care provider. A diaphragm should be replaced every 1-2 years, after giving birth, after gaining more than 15 lb (6.8 kg), and after pelvic surgery. Cervical cap A cervical cap is a round, soft latex or plastic cup that fits over the cervix. It is inserted into the vagina before sex, along with spermicide. It blocks sperm from entering the uterus. The cap should be left in place for 6-8 hours after sex and removed within 48 hours. A cervical cap must be prescribed and fitted by a health care provider. It should be replaced every 2 years. Sponge A sponge is a soft, circular piece of polyurethane foam with spermicide in it. The sponge helps block sperm from entering the uterus, and the spermicide kills sperm. To use it, you make it wet and then insert it into the vagina. It should be inserted before sex, left in for at least 6 hours after sex, and removed and thrown away within 30 hours. Spermicides Spermicides are chemicals that kill or block sperm from entering the   cervix and uterus. They can come as a cream, jelly, suppository, foam, or tablet. A spermicide should be inserted into the vagina with an applicator at least 10-15 minutes before sex to allow time for it to work. The process must be repeated every time you have sex. Spermicides do not require a prescription.   Intrauterine  contraception Intrauterine device (IUD) An IUD is a T-shaped device that is put in a woman's uterus. There are two types:  Hormone IUD.This type contains progestin, a synthetic form of the hormone progesterone. This type can stay in place for 3-5 years.  Copper IUD.This type is wrapped in copper wire. It can stay in place for 10 years. Permanent methods of contraception Female tubal ligation In this method, a woman's fallopian tubes are sealed, tied, or blocked during surgery to prevent eggs from traveling to the uterus. Hysteroscopic sterilization In this method, a small, flexible insert is placed into each fallopian tube. The inserts cause scar tissue to form in the fallopian tubes and block them, so sperm cannot reach an egg. The procedure takes about 3 months to be effective. Another form of birth control must be used during those 3 months. Female sterilization This is a procedure to tie off the tubes that carry sperm (vasectomy). After the procedure, the man can still ejaculate fluid (semen). Another form of birth control must be used for 3 months after the procedure. Natural planning methods Natural family planning In this method, a couple does not have sex on days when the woman could become pregnant. Calendar method In this method, the woman keeps track of the length of each menstrual cycle, identifies the days when pregnancy can happen, and does not have sex on those days. Ovulation method In this method, a couple avoids sex during ovulation. Symptothermal method This method involves not having sex during ovulation. The woman typically checks for ovulation by watching changes in her temperature and in the consistency of cervical mucus. Post-ovulation method In this method, a couple waits to have sex until after ovulation. Where to find more information  Centers for Disease Control and Prevention: www.cdc.gov Summary  Contraception, also called birth control, refers to methods or  devices that prevent pregnancy.  Hormonal methods of contraception include implants, injections, pills, patches, vaginal rings, and emergency contraceptives.  Barrier methods of contraception can include female condoms, female condoms, diaphragms, cervical caps, sponges, and spermicides.  There are two types of IUDs (intrauterine devices). An IUD can be put in a woman's uterus to prevent pregnancy for 3-5 years.  Permanent sterilization can be done through a procedure for males and females. Natural family planning methods involve nothaving sex on days when the woman could become pregnant. This information is not intended to replace advice given to you by your health care provider. Make sure you discuss any questions you have with your health care provider. Document Revised: 05/29/2019 Document Reviewed: 05/29/2019 Elsevier Patient Education  2021 [employer]   Breastfeeding  Choosing to breastfeed is one of the best decisions you can make for yourself and your baby. A change in hormones during pregnancy causes your breasts to make breast milk in your milk-producing glands. Hormones prevent breast milk from being released before your baby is born. They also prompt milk flow after birth. Once breastfeeding has begun, thoughts of your baby, as well as his or her sucking or crying, can stimulate the release of milk from your milk-producing glands. Benefits of breastfeeding Research shows that breastfeeding offers many health benefits   for infants and mothers. It also offers a cost-free and convenient way to feed your baby. For your baby  Your first milk (colostrum) helps your baby's digestive system to function better.  Special cells in your milk (antibodies) help your baby to fight off infections.  Breastfed babies are less likely to develop asthma, allergies, obesity, or type 2 diabetes. They are also at lower risk for sudden infant death syndrome (SIDS).  Nutrients in breast milk are better  able to meet your baby's needs compared to infant formula.  Breast milk improves your baby's brain development. For you  Breastfeeding helps to create a very special bond between you and your baby.  Breastfeeding is convenient. Breast milk costs nothing and is always available at the correct temperature.  Breastfeeding helps to burn calories. It helps you to lose the weight that you gained during pregnancy.  Breastfeeding makes your uterus return faster to its size before pregnancy. It also slows bleeding (lochia) after you give birth.  Breastfeeding helps to lower your risk of developing type 2 diabetes, osteoporosis, rheumatoid arthritis, cardiovascular disease, and breast, ovarian, uterine, and endometrial cancer later in life. Breastfeeding basics Starting breastfeeding  Find a comfortable place to sit or lie down, with your neck and back well-supported.  Place a pillow or a rolled-up blanket under your baby to bring him or her to the level of your breast (if you are seated). Nursing pillows are specially designed to help support your arms and your baby while you breastfeed.  Make sure that your baby's tummy (abdomen) is facing your abdomen.  Gently massage your breast. With your fingertips, massage from the outer edges of your breast inward toward the nipple. This encourages milk flow. If your milk flows slowly, you may need to continue this action during the feeding.  Support your breast with 4 fingers underneath and your thumb above your nipple (make the letter "C" with your hand). Make sure your fingers are well away from your nipple and your baby's mouth.  Stroke your baby's lips gently with your finger or nipple.  When your baby's mouth is open wide enough, quickly bring your baby to your breast, placing your entire nipple and as much of the areola as possible into your baby's mouth. The areola is the colored area around your nipple. ? More areola should be visible above your  baby's upper lip than below the lower lip. ? Your baby's lips should be opened and extended outward (flanged) to ensure an adequate, comfortable latch. ? Your baby's tongue should be between his or her lower gum and your breast.  Make sure that your baby's mouth is correctly positioned around your nipple (latched). Your baby's lips should create a seal on your breast and be turned out (everted).  It is common for your baby to suck about 2-3 minutes in order to start the flow of breast milk. Latching Teaching your baby how to latch onto your breast properly is very important. An improper latch can cause nipple pain, decreased milk supply, and poor weight gain in your baby. Also, if your baby is not latched onto your nipple properly, he or she may swallow some air during feeding. This can make your baby fussy. Burping your baby when you switch breasts during the feeding can help to get rid of the air. However, teaching your baby to latch on properly is still the best way to prevent fussiness from swallowing air while breastfeeding. Signs that your baby has successfully latched onto   your nipple  Silent tugging or silent sucking, without causing you pain. Infant's lips should be extended outward (flanged).  Swallowing heard between every 3-4 sucks once your milk has started to flow (after your let-down milk reflex occurs).  Muscle movement above and in front of his or her ears while sucking. Signs that your baby has not successfully latched onto your nipple  Sucking sounds or smacking sounds from your baby while breastfeeding.  Nipple pain. If you think your baby has not latched on correctly, slip your finger into the corner of your baby's mouth to break the suction and place it between your baby's gums. Attempt to start breastfeeding again. Signs of successful breastfeeding Signs from your baby  Your baby will gradually decrease the number of sucks or will completely stop sucking.  Your baby  will fall asleep.  Your baby's body will relax.  Your baby will retain a small amount of milk in his or her mouth.  Your baby will let go of your breast by himself or herself. Signs from you  Breasts that have increased in firmness, weight, and size 1-3 hours after feeding.  Breasts that are softer immediately after breastfeeding.  Increased milk volume, as well as a change in milk consistency and color by the fifth day of breastfeeding.  Nipples that are not sore, cracked, or bleeding. Signs that your baby is getting enough milk  Wetting at least 1-2 diapers during the first 24 hours after birth.  Wetting at least 5-6 diapers every 24 hours for the first week after birth. The urine should be clear or pale yellow by the age of 5 days.  Wetting 6-8 diapers every 24 hours as your baby continues to grow and develop.  At least 3 stools in a 24-hour period by the age of 5 days. The stool should be soft and yellow.  At least 3 stools in a 24-hour period by the age of 7 days. The stool should be seedy and yellow.  No loss of weight greater than 10% of birth weight during the first 3 days of life.  Average weight gain of 4-7 oz (113-198 g) per week after the age of 4 days.  Consistent daily weight gain by the age of 5 days, without weight loss after the age of 2 weeks. After a feeding, your baby may spit up a small amount of milk. This is normal. Breastfeeding frequency and duration Frequent feeding will help you make more milk and can prevent sore nipples and extremely full breasts (breast engorgement). Breastfeed when you feel the need to reduce the fullness of your breasts or when your baby shows signs of hunger. This is called "breastfeeding on demand." Signs that your baby is hungry include:  Increased alertness, activity, or restlessness.  Movement of the head from side to side.  Opening of the mouth when the corner of the mouth or cheek is stroked (rooting).  Increased  sucking sounds, smacking lips, cooing, sighing, or squeaking.  Hand-to-mouth movements and sucking on fingers or hands.  Fussing or crying. Avoid introducing a pacifier to your baby in the first 4-6 weeks after your baby is born. After this time, you may choose to use a pacifier. Research has shown that pacifier use during the first year of a baby's life decreases the risk of sudden infant death syndrome (SIDS). Allow your baby to feed on each breast as long as he or she wants. When your baby unlatches or falls asleep while feeding from the   first breast, offer the second breast. Because newborns are often sleepy in the first few weeks of life, you may need to awaken your baby to get him or her to feed. Breastfeeding times will vary from baby to baby. However, the following rules can serve as a guide to help you make sure that your baby is properly fed:  Newborns (babies 4 weeks of age or younger) may breastfeed every 1-3 hours.  Newborns should not go without breastfeeding for longer than 3 hours during the day or 5 hours during the night.  You should breastfeed your baby a minimum of 8 times in a 24-hour period. Breast milk pumping Pumping and storing breast milk allows you to make sure that your baby is exclusively fed your breast milk, even at times when you are unable to breastfeed. This is especially important if you go back to work while you are still breastfeeding, or if you are not able to be present during feedings. Your lactation consultant can help you find a method of pumping that works best for you and give you guidelines about how long it is safe to store breast milk.      Caring for your breasts while you breastfeed Nipples can become dry, cracked, and sore while breastfeeding. The following recommendations can help keep your breasts moisturized and healthy:  Avoid using soap on your nipples.  Wear a supportive bra designed especially for nursing. Avoid wearing underwire-style  bras or extremely tight bras (sports bras).  Air-dry your nipples for 3-4 minutes after each feeding.  Use only cotton bra pads to absorb leaked breast milk. Leaking of breast milk between feedings is normal.  Use lanolin on your nipples after breastfeeding. Lanolin helps to maintain your skin's normal moisture barrier. Pure lanolin is not harmful (not toxic) to your baby. You may also hand express a few drops of breast milk and gently massage that milk into your nipples and allow the milk to air-dry. In the first few weeks after giving birth, some women experience breast engorgement. Engorgement can make your breasts feel heavy, warm, and tender to the touch. Engorgement peaks within 3-5 days after you give birth. The following recommendations can help to ease engorgement:  Completely empty your breasts while breastfeeding or pumping. You may want to start by applying warm, moist heat (in the shower or with warm, water-soaked hand towels) just before feeding or pumping. This increases circulation and helps the milk flow. If your baby does not completely empty your breasts while breastfeeding, pump any extra milk after he or she is finished.  Apply ice packs to your breasts immediately after breastfeeding or pumping, unless this is too uncomfortable for you. To do this: ? Put ice in a plastic bag. ? Place a towel between your skin and the bag. ? Leave the ice on for 20 minutes, 2-3 times a day.  Make sure that your baby is latched on and positioned properly while breastfeeding. If engorgement persists after 48 hours of following these recommendations, contact your health care provider or a lactation consultant. Overall health care recommendations while breastfeeding  Eat 3 healthy meals and 3 snacks every day. Well-nourished mothers who are breastfeeding need an additional 450-500 calories a day. You can meet this requirement by increasing the amount of a balanced diet that you eat.  Drink  enough water to keep your urine pale yellow or clear.  Rest often, relax, and continue to take your prenatal vitamins to prevent fatigue, stress, and low   vitamin and mineral levels in your body (nutrient deficiencies).  Do not use any products that contain nicotine or tobacco, such as cigarettes and e-cigarettes. Your baby may be harmed by chemicals from cigarettes that pass into breast milk and exposure to secondhand smoke. If you need help quitting, ask your health care provider.  Avoid alcohol.  Do not use illegal drugs or marijuana.  Talk with your health care provider before taking any medicines. These include over-the-counter and prescription medicines as well as vitamins and herbal supplements. Some medicines that may be harmful to your baby can pass through breast milk.  It is possible to become pregnant while breastfeeding. If birth control is desired, ask your health care provider about options that will be safe while breastfeeding your baby. Where to find more information: [employer]: www.llli.org Contact a health care provider if:  You feel like you want to stop breastfeeding or have become frustrated with breastfeeding.  Your nipples are cracked or bleeding.  Your breasts are red, tender, or warm.  You have: ? Painful breasts or nipples. ? A swollen area on either breast. ? A fever or chills. ? Nausea or vomiting. ? Drainage other than breast milk from your nipples.  Your breasts do not become full before feedings by the fifth day after you give birth.  You feel sad and depressed.  Your baby is: ? Too sleepy to eat well. ? Having trouble sleeping. ? More than 1 week old and wetting fewer than 6 diapers in a 24-hour period. ? Not gaining weight by 5 days of age.  Your baby has fewer than 3 stools in a 24-hour period.  Your baby's skin or the white parts of his or her eyes become yellow. Get help right away if:  Your baby is overly tired  (lethargic) and does not want to wake up and feed.  Your baby develops an unexplained fever. Summary  Breastfeeding offers many health benefits for infant and mothers.  Try to breastfeed your infant when he or she shows early signs of hunger.  Gently tickle or stroke your baby's lips with your finger or nipple to allow the baby to open his or her mouth. Bring the baby to your breast. Make sure that much of the areola is in your baby's mouth. Offer one side and burp the baby before you offer the other side.  Talk with your health care provider or lactation consultant if you have questions or you face problems as you breastfeed. This information is not intended to replace advice given to you by your health care provider. Make sure you discuss any questions you have with your health care provider. Document Revised: 03/18/2017 Document Reviewed: 01/24/2016 Elsevier Patient Education  2021 [employer]  

## 2020-03-27 NOTE — Progress Notes (Signed)
   Subjective:  Carol Blair is a 30 y.o. [phone] at [redacted]w[redacted]d being seen today for ongoing prenatal care.  She is currently monitored for the following issues for this high-risk pregnancy and has Hypertension; Cervical dysplasia; Supervision of high risk pregnancy, antepartum; Preop cardiovascular exam; Chronic headaches; First trimester pregnancy; Chronic hypertension; BMI 40.0-44.9, adult (HCC); Obesity in pregnancy; History of severe pre-eclampsia; Low TSH level; Low serum vitamin D; Lewis isoimmunization during pregnancy; and Ovarian cyst on their problem list.  Patient reports nausea.  Contractions: Not present. Vag. Bleeding: None.  Movement: Present. Denies leaking of fluid.   The following portions of the patient's history were reviewed and updated as appropriate: allergies, current medications, past family history, past medical history, past social history, past surgical history and problem list. Problem list updated.  Objective:   Vitals:   03/27/20 1456  BP: 138/85  Pulse: 78  Weight: 287 lb 4.8 oz (130.3 kg)    Fetal Status: Fetal Heart Rate (bpm): 146   Movement: Present     General:  Alert, oriented and cooperative. Patient is in no acute distress.  Skin: Skin is warm and dry. No rash noted.   Cardiovascular: Normal heart rate noted  Respiratory: Normal respiratory effort, no problems with respiration noted  Abdomen: Soft, gravid, appropriate for gestational age. Pain/Pressure: Absent     Pelvic: Vag. Bleeding: None     Cervical exam deferred        Extremities: Normal range of motion.  Edema: None  Mental Status: Normal mood and affect. Normal behavior. Normal judgment and thought content.   Urinalysis:      Assessment and Plan:  Pregnancy: F0Y6378 at [redacted]w[redacted]d  1. Supervision of high risk pregnancy, antepartum BP well controlled  FHR normal Nausea not well controlled, refill for phenergan and switch zofran from ODT to regular pill  2. Chronic hypertension Well  controlled on labetalol 100mg  BID Following w MFM  3. Cervical dysplasia colpo earlier this month with no concerning lesions but SCJ not fully visualized, needs PP colpo and ECC  4. BMI 40.0-44.9, adult (HCC)   5. Chronic tension-type headache, not intractable   6. History of severe pre-eclampsia On baby ASA  7. Lewis isoimmunization during pregnancy in second trimester, single or unspecified fetus Not clinically significant  Preterm labor symptoms and general obstetric precautions including but not limited to vaginal bleeding, contractions, leaking of fluid and fetal movement were reviewed in detail with the patient. Please refer to After Visit Summary for other counseling recommendations.  Return in about 4 weeks (around 04/24/2020) for [facility], ob visit, needs MD.   [facility], MD

## 2020-03-27 NOTE — Progress Notes (Signed)
Spoke with patient about blood pressure and s/sx of hypo-hyper-tension. Patient denies any but does endorse some headache that goes away with acetaminophen. She is taking her medications as prescribed (labetalol, aspirin, and prenatal vitamin). BP today is 138/85. Would recommend continuing labetalol at current dose.  She is still have nausea/vomiting and taking promethazine which is helping with vomiting. She would like to take ondansetron for nausea as well but cannot tolerate ODTs. Will request switch to oral tablets.

## 2020-04-11 ENCOUNTER — Other Ambulatory Visit: Payer: Self-pay

## 2020-04-11 ENCOUNTER — Ambulatory Visit: Payer: Medicaid Other | Admitting: *Deleted

## 2020-04-11 ENCOUNTER — Encounter: Payer: Self-pay | Admitting: *Deleted

## 2020-04-11 ENCOUNTER — Ambulatory Visit: Payer: Medicaid Other | Attending: Obstetrics and Gynecology

## 2020-04-11 VITALS — BP 133/80 | HR 74

## 2020-04-11 DIAGNOSIS — Z3A22 22 weeks gestation of pregnancy: Secondary | ICD-10-CM | POA: Diagnosis not present

## 2020-04-11 DIAGNOSIS — Z363 Encounter for antenatal screening for malformations: Secondary | ICD-10-CM | POA: Diagnosis not present

## 2020-04-11 DIAGNOSIS — O99212 Obesity complicating pregnancy, second trimester: Secondary | ICD-10-CM

## 2020-04-11 DIAGNOSIS — I10 Essential (primary) hypertension: Secondary | ICD-10-CM | POA: Insufficient documentation

## 2020-04-11 DIAGNOSIS — O322XX0 Maternal care for transverse and oblique lie, not applicable or unspecified: Secondary | ICD-10-CM

## 2020-04-11 DIAGNOSIS — O10012 Pre-existing essential hypertension complicating pregnancy, second trimester: Secondary | ICD-10-CM | POA: Diagnosis not present

## 2020-04-11 DIAGNOSIS — O10912 Unspecified pre-existing hypertension complicating pregnancy, second trimester: Secondary | ICD-10-CM | POA: Insufficient documentation

## 2020-04-12 ENCOUNTER — Other Ambulatory Visit: Payer: Self-pay | Admitting: *Deleted

## 2020-04-12 DIAGNOSIS — O10912 Unspecified pre-existing hypertension complicating pregnancy, second trimester: Secondary | ICD-10-CM

## 2020-04-24 ENCOUNTER — Telehealth (INDEPENDENT_AMBULATORY_CARE_PROVIDER_SITE_OTHER): Payer: Medicaid Other | Admitting: Family Medicine

## 2020-04-24 ENCOUNTER — Other Ambulatory Visit: Payer: Self-pay

## 2020-04-24 VITALS — BP 133/83 | HR 74

## 2020-04-24 DIAGNOSIS — O0990 Supervision of high risk pregnancy, unspecified, unspecified trimester: Secondary | ICD-10-CM

## 2020-04-24 DIAGNOSIS — E669 Obesity, unspecified: Secondary | ICD-10-CM

## 2020-04-24 DIAGNOSIS — O10012 Pre-existing essential hypertension complicating pregnancy, second trimester: Secondary | ICD-10-CM

## 2020-04-24 DIAGNOSIS — E559 Vitamin D deficiency, unspecified: Secondary | ICD-10-CM

## 2020-04-24 DIAGNOSIS — I10 Essential (primary) hypertension: Secondary | ICD-10-CM

## 2020-04-24 DIAGNOSIS — Z3A24 24 weeks gestation of pregnancy: Secondary | ICD-10-CM

## 2020-04-24 DIAGNOSIS — Z8759 Personal history of other complications of pregnancy, childbirth and the puerperium: Secondary | ICD-10-CM

## 2020-04-24 DIAGNOSIS — O3442 Maternal care for other abnormalities of cervix, second trimester: Secondary | ICD-10-CM

## 2020-04-24 DIAGNOSIS — R7989 Other specified abnormal findings of blood chemistry: Secondary | ICD-10-CM

## 2020-04-24 DIAGNOSIS — Z6841 Body Mass Index (BMI) 40.0 and over, adult: Secondary | ICD-10-CM

## 2020-04-24 DIAGNOSIS — O99282 Endocrine, nutritional and metabolic diseases complicating pregnancy, second trimester: Secondary | ICD-10-CM

## 2020-04-24 DIAGNOSIS — N83209 Unspecified ovarian cyst, unspecified side: Secondary | ICD-10-CM

## 2020-04-24 DIAGNOSIS — O99212 Obesity complicating pregnancy, second trimester: Secondary | ICD-10-CM

## 2020-04-24 DIAGNOSIS — N879 Dysplasia of cervix uteri, unspecified: Secondary | ICD-10-CM

## 2020-04-24 DIAGNOSIS — O3482 Maternal care for other abnormalities of pelvic organs, second trimester: Secondary | ICD-10-CM

## 2020-04-24 DIAGNOSIS — N946 Dysmenorrhea, unspecified: Secondary | ICD-10-CM

## 2020-04-24 MED ORDER — LABETALOL HCL 100 MG PO TABS: 100.0000 mg | ORAL_TABLET | Freq: Two times a day (BID) | ORAL | 0 refills | Status: DC

## 2020-04-24 NOTE — Progress Notes (Signed)
OBSTETRICS PRENATAL VIRTUAL VISIT ENCOUNTER NOTE  Provider location: Center for [facility]   Patient location: Home  I connected with [name] on 04/24/20 at  1:55 PM EDT by MyChart Video Encounter and verified that I am speaking with the correct person using two identifiers. I discussed the limitations, risks, security and privacy concerns of performing an evaluation and management service virtually and the availability of in person appointments. I also discussed with the patient that there may be a patient responsible charge related to this service. The patient expressed understanding and agreed to proceed. Subjective:  Carol Blair is a 30 y.o. [phone] at [redacted]w[redacted]d being seen today for ongoing prenatal care.  She is currently monitored for the following issues for this high-risk pregnancy and has Hypertension; Cervical dysplasia; Supervision of high risk pregnancy, antepartum; Preop cardiovascular exam; Chronic headaches; First trimester pregnancy; Chronic hypertension; BMI 40.0-44.9, adult (HCC); Obesity in pregnancy; History of severe pre-eclampsia; Low TSH level; Low serum vitamin D; Lewis isoimmunization during pregnancy; and Ovarian cyst on their problem list.  Patient reports no complaints.  Contractions: Not present. Vag. Bleeding: None.  Movement: Present. Denies any leaking of fluid.   The following portions of the patient's history were reviewed and updated as appropriate: allergies, current medications, past family history, past medical history, past social history, past surgical history and problem list.   Objective:   Vitals:   04/24/20 1355  BP: 133/83  Pulse: 74    Fetal Status:     Movement: Present     General:  Alert, oriented and cooperative. Patient is in no acute distress.  Respiratory: Normal respiratory effort, no problems with respiration noted  Mental Status: Normal mood and affect. Normal behavior. Normal judgment and  thought content.  Rest of physical exam deferred due to type of encounter  Imaging: [place] MFM OB FOLLOW UP  Result Date: 04/11/2020 ----------------------------------------------------------------------  OBSTETRICS REPORT                       (Signed Final 04/11/2020 04:16 pm) ---------------------------------------------------------------------- Patient Info  ID #:       [id number]                          D.O.B.:  30-Jun-1990 (30 yrs)  Name:       Carol Blair                 Visit Date: 04/11/2020 03:22 pm ---------------------------------------------------------------------- Performed By  Attending:        Ma Rings MD         Referred By:      Milus Mallick                                                             PRAY  Performed By:     Birdena Crandall        [facility] ---------------------------------------------------------------------- Orders  #  Description                           Code        Ordered By  1  US MFM OB FOLLOW UP                   [id number]    [facility] ----------------------------------------------------------------------  #  Order #                     Accession #                Episode #  1  [id number]                   [id number]                 [id number] ---------------------------------------------------------------------- Indications  Hypertension - Chronic/Pre-existing            O10.019  (Labetalol)  Obesity complicating pregnancy, first          O99.211  trimester (Pre-G BMI 46)  Encounter for antenatal screening for          Z36.3  malformations  [redacted] weeks gestation of pregnancy                Z3A.22 ---------------------------------------------------------------------- Fetal Evaluation  Num Of Fetuses:         1  Fetal Heart Rate(bpm):   138  Cardiac Activity:       Observed  Presentation:           Transverse, head to maternal left  Placenta:               Anterior  P. Cord Insertion:      Visualized  Amniotic Fluid  AFI FV:      Within normal limits                              Largest Pocket(cm)                              3.98 ---------------------------------------------------------------------- Biometry  BPD:      56.1  mm     G. Age:  23w 1d         62  %    CI:        68.53   %    70 - 86  FL/HC:      18.6   %    19.2 - 20.8  HC:      216.6  mm     G. Age:  23w 5d         76  %    HC/AC:      1.15        1.05 - 1.21  AC:       188   mm     G. Age:  23w 4d         69  %    FL/BPD:     71.8   %    71 - 87  FL:       40.3  mm     G. Age:  23w 0d         49  %    FL/AC:      21.4   %    20 - 24  Est. FW:     587  gm      1 lb 5 oz     75  % ---------------------------------------------------------------------- OB History  Gravidity:    5         Term:   3         SAB:   1  Living:       3 ---------------------------------------------------------------------- Gestational Age  LMP:           22w 5d        Date:  11/04/19                 EDD:   08/10/20  U/S Today:     23w 3d                                        EDD:   08/05/20  Best:          22w 5d     Det. By:  LMP  (11/04/19)          EDD:   08/10/20 ---------------------------------------------------------------------- Anatomy  Cranium:               Appears normal         Aortic Arch:            Appears normal  Cavum:                 Appears normal         Ductal Arch:            Appears normal  Ventricles:            Appears normal         Diaphragm:              Appears normal  Choroid Plexus:        Previously seen        Stomach:                Appears normal, left  sided  Cerebellum:            Appears normal         Abdomen:                Appears normal   Posterior Fossa:       Appears normal         Abdominal Wall:         Appears nml (cord                                                                        insert, abd wall)  Nuchal Fold:           Previously seen        Cord Vessels:           Appears normal (3                                                                        vessel cord)  Face:                  Orbits and profile     Kidneys:                Appear normal                         previously seen  Lips:                  Previously seen        Bladder:                Appears normal  Thoracic:              Appears normal         Spine:                  Previously seen  Heart:                 Appears normal         Upper Extremities:      Previously seen                         (4CH, axis, and                         situs)  RVOT:                  Appears normal         Lower Extremities:      Previously seen  LVOT:                  Appears normal  Other:  Fetus appears to be a female. ---------------------------------------------------------------------- Cervix Uterus Adnexa  Cervix  Length:            3.4  cm.  Normal appearance by transabdominal scan.  Uterus  No abnormality visualized.  Cul De Sac  No free fluid seen. ---------------------------------------------------------------------- Comments  This patient was seen for a follow up exam as the views of  the fetal anatomy were unable to be fully visualized during  her last exam.  Her pregnancy has also been complicated by  maternal obesity and chronic hypertension currently treated  with labetalol.  She denies any problems since her last exam.  She was informed that the fetal growth and amniotic fluid  level appears appropriate for her gestational age.  The views of the fetal anatomy were visualized today.  There  were no obvious anomalies noted.  The limitations of ultrasound in the detection of all anomalies  was discussed.  Due to chronic hypertension, a follow-up growth scan was   scheduled in 4 weeks.  Due to chronic hypertension and maternal obesity, weekly  fetal testing will be started at around 32 weeks ----------------------------------------------------------------------                   Ma Rings, MD Electronically Signed Final Report   04/11/2020 04:16 pm ----------------------------------------------------------------------   Assessment and Plan:  Pregnancy: A8T4196 at [redacted]w[redacted]d 1. Supervision of high risk pregnancy, antepartum  2. Low serum vitamin D FInished high dose vit D supplements. Encouraged patient to continue with 2000 IU supplements dailys  3. Low TSH level Check TSH at 28 weeks  4. Chronic hypertension Controlled on labetalol 100mg  BID On ASA 81mg  Growth 5/5  5. BMI 40.0-44.9, adult (HCC)  6. History of severe pre-eclampsia On ASA  Preterm labor symptoms and general obstetric precautions including but not limited to vaginal bleeding, contractions, leaking of fluid and fetal movement were reviewed in detail with the patient. I discussed the assessment and treatment plan with the patient. The patient was provided an opportunity to ask questions and all were answered. The patient agreed with the plan and demonstrated an understanding of the instructions. The patient was advised to call back or seek an in-person office evaluation/go to [facility]U at [facility] for any urgent or concerning symptoms. Please refer to After Visit Summary for other counseling recommendations.   I provided 12 minutes of face-to-face time during this encounter.  Return in about 4 weeks (around 05/22/2020) for HR OB f/u, 2 hr GTT, In Office.  Future Appointments  Date Time Provider Department Center  05/09/2020  2:00 PM [facility] NURSE [facility]  05/09/2020  2:15 PM [facility] US2 [facility]    [facility] DO Center for 07/09/2020, [facility]

## 2020-04-25 ENCOUNTER — Other Ambulatory Visit: Payer: Self-pay | Admitting: Obstetrics and Gynecology

## 2020-05-08 ENCOUNTER — Inpatient Hospital Stay (HOSPITAL_COMMUNITY)
Admission: AD | Admit: 2020-05-08 | Discharge: 2020-05-08 | Disposition: A | Discharge status: Home / Self Care | Payer: Medicaid Other | Attending: Obstetrics & Gynecology | Admitting: Obstetrics & Gynecology

## 2020-05-08 ENCOUNTER — Other Ambulatory Visit: Payer: Self-pay

## 2020-05-08 ENCOUNTER — Encounter (HOSPITAL_COMMUNITY): Payer: Self-pay | Admitting: Obstetrics & Gynecology

## 2020-05-08 DIAGNOSIS — Z79899 Other long term (current) drug therapy: Secondary | ICD-10-CM | POA: Insufficient documentation

## 2020-05-08 DIAGNOSIS — Y9301 Activity, walking, marching and hiking: Secondary | ICD-10-CM | POA: Insufficient documentation

## 2020-05-08 DIAGNOSIS — W108XXA Fall (on) (from) other stairs and steps, initial encounter: Secondary | ICD-10-CM | POA: Diagnosis not present

## 2020-05-08 DIAGNOSIS — W109XXA Fall (on) (from) unspecified stairs and steps, initial encounter: Secondary | ICD-10-CM | POA: Diagnosis not present

## 2020-05-08 DIAGNOSIS — M25571 Pain in right ankle and joints of right foot: Secondary | ICD-10-CM | POA: Diagnosis not present

## 2020-05-08 DIAGNOSIS — M25531 Pain in right wrist: Secondary | ICD-10-CM | POA: Insufficient documentation

## 2020-05-08 DIAGNOSIS — Y929 Unspecified place or not applicable: Secondary | ICD-10-CM | POA: Insufficient documentation

## 2020-05-08 DIAGNOSIS — I10 Essential (primary) hypertension: Secondary | ICD-10-CM

## 2020-05-08 DIAGNOSIS — Y999 Unspecified external cause status: Secondary | ICD-10-CM | POA: Insufficient documentation

## 2020-05-08 DIAGNOSIS — Z3A26 26 weeks gestation of pregnancy: Secondary | ICD-10-CM | POA: Diagnosis not present

## 2020-05-08 DIAGNOSIS — O99891 Other specified diseases and conditions complicating pregnancy: Secondary | ICD-10-CM | POA: Diagnosis not present

## 2020-05-08 DIAGNOSIS — O26892 Other specified pregnancy related conditions, second trimester: Secondary | ICD-10-CM | POA: Insufficient documentation

## 2020-05-08 LAB — URINALYSIS, ROUTINE W REFLEX MICROSCOPIC
Bilirubin Urine: NEGATIVE
Glucose, UA: NEGATIVE mg/dL
Hgb urine dipstick: NEGATIVE
Ketones, ur: NEGATIVE mg/dL
Leukocytes,Ua: NEGATIVE
Nitrite: NEGATIVE
Protein, ur: NEGATIVE mg/dL
Specific Gravity, Urine: 1.017 (ref 1.005–1.030)
pH: 7 (ref 5.0–8.0)

## 2020-05-08 MED ORDER — LABETALOL HCL 200 MG PO TABS: 200.0000 mg | ORAL_TABLET | Freq: Two times a day (BID) | ORAL | 1 refills | Status: DC

## 2020-05-08 NOTE — MAU Provider Note (Signed)
Patient Carol Blair is 30 y.o. [phone]  at [redacted]w[redacted]d here with concern for fall. She fell this morning at 7:45 am while rushing down her steps. She dropped her son off at school and then called Carol Blair. Carol Blair advised her to come in for evaluation.   She denies contractions, bleeding, LOF, decreased fetal movements. She is a CHTN on labetalol; she took her labetalol this morning at 8:30.   She reports that her hips and tailbone hurt daily and so she is "used to that discomfort".  History     CSN: [id number]  Arrival date and time: 05/08/20 0915   None     Chief Complaint  Patient presents with  . Fall   Fall The accident occurred 3 to 6 hours ago. Fall occurred: while walking down stairs in a hurry. She fell from a height of 3 to 5 ft. She landed on concrete. There was no blood loss. Pain location: right ankle, right shin, tailbone and right wrist. Pertinent negatives include no abdominal pain, fever, headaches, visual change or vomiting.  She did not hit her abdomen or her head.   OB History    Gravida  5   Para  3   Term  3   Preterm      AB  1   Living  3     SAB  1   IAB  0   Ectopic  0   Multiple  0   Live Births  3           Past Medical History:  Diagnosis Date  . Benign essential hypertension antepartum 02/11/2011  . Obesity   . Pregnancy induced hypertension   . Vaginal Pap smear, abnormal     Past Surgical History:  Procedure Laterality Date  . LAPAROSCOPIC CHOLECYSTECTOMY W/ CHOLANGIOGRAPHY  06/10/10   Dr Carol Blair    Family History  Problem Relation Age of Onset  . Hypertension Mother   . Hypertension Sister   . Kidney disease Sister   . Cancer Maternal Grandmother        GI  . Breast cancer Maternal Aunt   . Cancer Maternal Uncle        colon    Social History   Tobacco Use  . Smoking status: Never Smoker  . Smokeless tobacco: Never Used  Vaping Use  . Vaping Use: Never used  Substance Use Topics  . Alcohol use: No  . Drug use:  No    Allergies:  Allergies  Allergen Reactions  . Codeine Nausea And Vomiting and Other (See Comments)    Shaking, sweating    Medications Prior to Admission  Medication Sig Dispense Refill Last Dose  . acetaminophen (TYLENOL) 500 MG tablet Take 1,000 mg by mouth every 6 (six) hours as needed for headache.   Past Week at Unknown time  . aspirin EC 81 MG tablet Take 1 tablet (81 mg total) by mouth daily. 60 tablet 1 Past Week at Unknown time  . labetalol (NORMODYNE) 100 MG tablet Take 1 tablet (100 mg total) by mouth 2 (two) times daily. 180 tablet 0 05/08/2020 at Unknown time  . Prenatal Vit-Fe Fumarate-FA (PRENATAL VITAMIN AND MINERAL) 28-0.8 MG TABS Take 1 tablet by mouth daily. 60 tablet 2 05/08/2020 at Unknown time  . promethazine (PHENERGAN) 25 MG tablet Take 1 tablet (25 mg total) by mouth every 6 (six) hours as needed for nausea or vomiting. 30 tablet 2 05/07/2020 at Unknown time  . ondansetron (ZOFRAN)  4 MG tablet Take 1 tablet (4 mg total) by mouth every 8 (eight) hours as needed for nausea or vomiting. 30 tablet 2     Review of Systems  Constitutional: Negative for fever.  Respiratory: Negative.   Cardiovascular: Negative.   Gastrointestinal: Negative for abdominal pain and vomiting.  Neurological: Negative for headaches.   Physical Exam   Blood pressure (!) 153/84, pulse 76, temperature 98.4 F (36.9 C), temperature source Oral, resp. rate 20, height 5' 4.5" (1.638 m), weight 131.6 kg, last menstrual period 11/04/2019, SpO2 99 %, unknown if currently breastfeeding.  Physical Exam Constitutional:      Appearance: Normal appearance.  HENT:     Mouth/Throat:     Mouth: Mucous membranes are moist.  Abdominal:     General: Abdomen is flat. There is no distension.     Palpations: Abdomen is soft. There is no mass.  Musculoskeletal:        General: Normal range of motion.  Skin:    General: Skin is warm and dry.  Neurological:     General: No focal deficit present.      Mental Status: She is alert.     MAU Course  Procedures  MDM -NST: 140 bpm, mod var, present acel, no decels, no contractions -Patient fall occurred at 7:45; will monitor patient until 11:45.  -Patient BP slightly elevated today in MAU; she is not symptomatic and she is a known CHTN.  Discussed with Dr. Macon Blair; we will increase her dosage to 200 mg BID.  -Patient desires discharge; has not been monitored for the full four hours but due to lack of abdominal trauma and patient's lack of symptoms, comfortable with discharge at this time.  Patient denies bleeding, LOF, contractions or decreased fetal movement while in MAU.  Assessment and Plan   1. Fall (on) (from) other stairs and steps, initial encounter    -NST reacting; patient very comfortable while in MAU.  -BP increase from past clinic visits; will increase to 200 BID daily per discussion with Dr. Mervyn Blair -keep appt tomorrow at [facility]FM for [place] due to [facility] -return precautions reviewed   Carol Blair Carol Blair 05/08/2020, 10:23 AM

## 2020-05-08 NOTE — MAU Note (Signed)
Going down 2 step, ? Foot rolled on last , put out hands to catch self, scraped rt knee and shin, somehow landed on butt. Wrists and tailbone are 'killing her', scrapes are burning. Denies bleeding or ROM. +FM

## 2020-05-09 ENCOUNTER — Encounter: Payer: Self-pay | Admitting: *Deleted

## 2020-05-09 ENCOUNTER — Other Ambulatory Visit: Payer: Self-pay | Admitting: Obstetrics and Gynecology

## 2020-05-09 ENCOUNTER — Ambulatory Visit: Payer: Medicaid Other | Attending: Obstetrics and Gynecology

## 2020-05-09 ENCOUNTER — Ambulatory Visit: Payer: Medicaid Other | Admitting: *Deleted

## 2020-05-09 VITALS — BP 117/65 | HR 65

## 2020-05-09 DIAGNOSIS — O10012 Pre-existing essential hypertension complicating pregnancy, second trimester: Secondary | ICD-10-CM | POA: Diagnosis not present

## 2020-05-09 DIAGNOSIS — O10912 Unspecified pre-existing hypertension complicating pregnancy, second trimester: Secondary | ICD-10-CM | POA: Diagnosis present

## 2020-05-09 DIAGNOSIS — O10913 Unspecified pre-existing hypertension complicating pregnancy, third trimester: Secondary | ICD-10-CM

## 2020-05-09 DIAGNOSIS — O99212 Obesity complicating pregnancy, second trimester: Secondary | ICD-10-CM

## 2020-05-09 DIAGNOSIS — Z3A26 26 weeks gestation of pregnancy: Secondary | ICD-10-CM | POA: Diagnosis not present

## 2020-05-13 ENCOUNTER — Inpatient Hospital Stay (HOSPITAL_COMMUNITY)
Admission: AD | Admit: 2020-05-13 | Discharge: 2020-05-13 | Disposition: A | Discharge status: Home / Self Care | Payer: Medicaid Other | Attending: Family Medicine | Admitting: Family Medicine

## 2020-05-13 ENCOUNTER — Other Ambulatory Visit: Payer: Self-pay

## 2020-05-13 ENCOUNTER — Telehealth: Payer: Self-pay | Admitting: Family Medicine

## 2020-05-13 ENCOUNTER — Encounter (HOSPITAL_COMMUNITY): Payer: Self-pay | Admitting: Family Medicine

## 2020-05-13 DIAGNOSIS — R519 Headache, unspecified: Secondary | ICD-10-CM | POA: Diagnosis present

## 2020-05-13 DIAGNOSIS — O26892 Other specified pregnancy related conditions, second trimester: Secondary | ICD-10-CM | POA: Insufficient documentation

## 2020-05-13 DIAGNOSIS — Z3A27 27 weeks gestation of pregnancy: Secondary | ICD-10-CM | POA: Diagnosis not present

## 2020-05-13 DIAGNOSIS — O10912 Unspecified pre-existing hypertension complicating pregnancy, second trimester: Secondary | ICD-10-CM | POA: Diagnosis not present

## 2020-05-13 DIAGNOSIS — Z79899 Other long term (current) drug therapy: Secondary | ICD-10-CM | POA: Insufficient documentation

## 2020-05-13 DIAGNOSIS — Z3689 Encounter for other specified antenatal screening: Secondary | ICD-10-CM

## 2020-05-13 DIAGNOSIS — Z7982 Long term (current) use of aspirin: Secondary | ICD-10-CM | POA: Diagnosis not present

## 2020-05-13 DIAGNOSIS — O09292 Supervision of pregnancy with other poor reproductive or obstetric history, second trimester: Secondary | ICD-10-CM | POA: Insufficient documentation

## 2020-05-13 DIAGNOSIS — Z885 Allergy status to narcotic agent status: Secondary | ICD-10-CM | POA: Diagnosis not present

## 2020-05-13 DIAGNOSIS — O10919 Unspecified pre-existing hypertension complicating pregnancy, unspecified trimester: Secondary | ICD-10-CM

## 2020-05-13 LAB — COMPREHENSIVE METABOLIC PANEL
ALT: 16 U/L (ref 0–44)
AST: 16 U/L (ref 15–41)
Albumin: 2.7 g/dL — ABNORMAL LOW (ref 3.5–5.0)
Alkaline Phosphatase: 91 U/L (ref 38–126)
Anion gap: 8 (ref 5–15)
BUN: <5 mg/dL — ABNORMAL LOW (ref 6–20)
CO2: 21 mmol/L — ABNORMAL LOW (ref 22–32)
Calcium: 9.1 mg/dL (ref 8.9–10.3)
Chloride: 105 mmol/L (ref 98–111)
Creatinine, Ser: 0.64 mg/dL (ref 0.44–1.00)
GFR, Estimated: >60 mL/min (ref >60)
Glucose, Bld: 107 mg/dL — ABNORMAL HIGH (ref 70–99)
Potassium: 3.4 mmol/L — ABNORMAL LOW (ref 3.5–5.1)
Sodium: 134 mmol/L — ABNORMAL LOW (ref 135–145)
Total Bilirubin: 0.3 mg/dL (ref 0.3–1.2)
Total Protein: 6.7 g/dL (ref 6.5–8.1)

## 2020-05-13 LAB — URINALYSIS, ROUTINE W REFLEX MICROSCOPIC
Bilirubin Urine: NEGATIVE
Glucose, UA: NEGATIVE mg/dL
Hgb urine dipstick: NEGATIVE
Ketones, ur: NEGATIVE mg/dL
Leukocytes,Ua: NEGATIVE
Nitrite: NEGATIVE
Protein, ur: NEGATIVE mg/dL
Specific Gravity, Urine: 1.009 (ref 1.005–1.030)
pH: 6 (ref 5.0–8.0)

## 2020-05-13 LAB — CBC
HCT: 32.1 % — ABNORMAL LOW (ref 36.0–46.0)
Hemoglobin: 10.6 g/dL — ABNORMAL LOW (ref 12.0–15.0)
MCH: 30.9 pg (ref 26.0–34.0)
MCHC: 33 g/dL (ref 30.0–36.0)
MCV: 93.6 fL (ref 80.0–100.0)
Platelets: 247 10*3/uL (ref 150–400)
RBC: 3.43 MIL/uL — ABNORMAL LOW (ref 3.87–5.11)
RDW: 12.6 % (ref 11.5–15.5)
WBC: 8.9 10*3/uL (ref 4.0–10.5)
nRBC: 0 % (ref 0.0–0.2)

## 2020-05-13 LAB — PROTEIN / CREATININE RATIO, URINE
Creatinine, Urine: 76.71 mg/dL
Total Protein, Urine: <6 mg/dL

## 2020-05-13 MED ORDER — ACETAMINOPHEN 325 MG PO TABS
650.0000 mg | ORAL_TABLET | Freq: Once | ORAL | Status: AC
  Administered 2020-05-13: 650 mg via ORAL
  Filled 2020-05-13: qty 2

## 2020-05-13 MED ORDER — NIFEDIPINE ER OSMOTIC RELEASE 30 MG PO TB24: 30.0000 mg | ORAL_TABLET | Freq: Every day | ORAL | 2 refills | Status: DC

## 2020-05-13 NOTE — Telephone Encounter (Signed)
Patient has some questions regarding her blood pressure medication, state she don't have any energy.

## 2020-05-13 NOTE — Telephone Encounter (Signed)
Returned patients call.   Patient reports she increased her Labetalol to 200 mg, she reports since dose increased she has had no energy. She has not been able to get anything done. She feels like she is going to pass out. She takes naps and is still exhausted. She has not taken her BP since she left the hospital.   She is having daily headaches after taking the Labetalol, she notices in the morning, not sure at night as she falls asleep. The headache does not go away with Tylenol. The headache is relatively new since she has been taking the increased Labetalol.   She denies no new swelling. She has no blurred vision.   She is still nauseated daily. She is light headed most of the time and this has increased since taking Labetalol.   She is not eating and drinking as well as she would like due to the nausea.   Patient is not home, she will be home in 1 hour. Reviewed to go home and check her BP after sitting for 15 minutes with feet flat on floor and is greater that 140/90 she is to go back to MAU for evaluation this evening.  Reviewed if she is not improving with any of her symptoms despite BP to go to MAU for evaluation . Patient voiced understanding.   Patient has recently moved also.

## 2020-05-13 NOTE — MAU Note (Signed)
Pt reports her labetalol was increased to 200 BID, states she has felt like all she wants to do is sleep and like she may pass out sometimes. Called the office and they told her to have her b/p checked and she did at [facility] and it was 148/95. Was told to come for eval. Headache all day today.

## 2020-05-13 NOTE — Discharge Instructions (Signed)
Hypertension During Pregnancy Hypertension is also called high blood pressure. High blood pressure means that the force of the blood moving in your body is high enough to cause problems for you and your baby. Different types of high blood pressure can happen during pregnancy. The types are:  High blood pressure before you got pregnant. This is called chronic hypertension.  This can continue during your pregnancy. Your doctor will want to keep checking your blood pressure. You may need medicine to control your blood pressure while you are pregnant. You will need follow-up visits after you have your baby.  High blood pressure that goes up during pregnancy when it was normal before. This is called gestational hypertension. It will often get better after you have your baby, but your doctor will need to watch your blood pressure to make sure that it is getting better.  You may develop high blood pressure after giving birth. This is called postpartum hypertension. This often occurs within 48 hours after childbirth but may occur up to 6 weeks after giving birth. Very high blood pressure during pregnancy is an emergency that needs treatment right away. How does this affect me? If you have high blood pressure during pregnancy, you have a higher chance of developing high blood pressure:  As you get older.  If you get pregnant again. In some cases, high blood pressure during pregnancy can cause:  Stroke.  Heart attack.  Damage to the kidneys, lungs, or liver.  Preeclampsia.  HELLP syndrome.  Seizures.  Problems with the placenta. How does this affect my baby? Your baby may:  Be born early.  Not weigh as much as he or she should.  Not handle labor well, leading to a C-section. This condition may also result in a baby's death before birth (stillbirth). What are the risks?  Having high blood pressure during a past pregnancy.  Being overweight.  Being age 35 or older.  Being pregnant  for the first time.  Being pregnant with more than one baby.  Becoming pregnant using fertility methods, such as IVF.  Having other problems, such as diabetes or kidney disease. What can I do to lower my risk?  Keep a healthy weight.  Eat a healthy diet.  Follow what your doctor tells you about treating any medical problems that you had before you got pregnant. It is very important to go to all of your doctor visits. Your doctor will check your blood pressure and make sure that your pregnancy is progressing as it should. Treatment should start early if a problem is found.   How is this treated? Treatment for high blood pressure during pregnancy can vary. It depends on the type of high blood pressure you have and how serious it is.  If you were taking medicine for your blood pressure before you got pregnant, talk with your doctor. You may need to change the medicine during pregnancy if it is not safe for your baby.  If your blood pressure goes up during pregnancy, your doctor may order medicine to treat this.  If you are at risk for preeclampsia, your doctor may tell you to take a low-dose aspirin while you are pregnant.  If you have very high blood pressure, you may need to stay in the hospital so you and your baby can be watched closely. You may also need to take medicine to lower your blood pressure.  In some cases, if your condition gets worse, you may need to have your baby early.   Follow these instructions at home: Eating and drinking  Drink enough fluid to keep your pee (urine) pale yellow.  Avoid caffeine.   Lifestyle  Do not smoke or use any products that contain nicotine or tobacco. If you need help quitting, ask your doctor.  Do not use alcohol or drugs.  Avoid stress.  Rest and get plenty of sleep.  Regular exercise can help. Ask your doctor what kinds of exercise are best for you. General instructions  Take over-the-counter and prescription medicines only as  told by your doctor.  Keep all prenatal and follow-up visits. Contact a doctor if:  You have symptoms that your doctor told you to watch for, such as: ? Headaches. ? A feeling like you may vomit (nausea). ? Vomiting. ? Belly (abdominal) pain. ? Feeling dizzy or light-headed. Get help right away if:  You have symptoms of serious problems, such as: ? Very bad belly pain that does not get better with treatment. ? A very bad headache that does not get better. ? Blurry vision. ? Double vision. ? Vomiting that does not get better. ? Sudden, fast weight gain. ? Sudden swelling in your hands, ankles, or face. ? Bleeding from your vagina. ? Blood in your pee. ? Shortness of breath. ? Chest pain. ? Weakness on one side of your body. ? Trouble talking.  Your baby is not moving as much as usual. These symptoms may be an emergency. Get help right away. Call your local emergency services (911 in the [place]  Do not wait to see if the symptoms will go away.  Do not drive yourself to the hospital. Summary  High blood pressure is also called hypertension.  High blood pressure means that the force of the blood moving in your body is high enough to cause problems for you and your baby.  Get help right away if you have symptoms of serious problems due to high blood pressure.  Keep all prenatal and follow-up visits. This information is not intended to replace advice given to you by your health care provider. Make sure you discuss any questions you have with your health care provider. Document Revised: 09/14/2019 Document Reviewed: 09/14/2019 Elsevier Patient Education  2021 Elsevier Inc.  
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
## 2020-05-16 ENCOUNTER — Other Ambulatory Visit: Payer: Self-pay

## 2020-05-16 ENCOUNTER — Ambulatory Visit (INDEPENDENT_AMBULATORY_CARE_PROVIDER_SITE_OTHER): Payer: Medicaid Other

## 2020-05-16 VITALS — BP 144/85 | HR 74 | Ht 64.0 in | Wt 288.8 lb

## 2020-05-16 DIAGNOSIS — Z8759 Personal history of other complications of pregnancy, childbirth and the puerperium: Secondary | ICD-10-CM

## 2020-05-16 DIAGNOSIS — I10 Essential (primary) hypertension: Secondary | ICD-10-CM

## 2020-05-16 NOTE — Progress Notes (Signed)
Pt here today for BP check as follow up from MAU. Pt states BP meds were changed at MAU from Labetalol to Procardia. Pt has been taking 30 mg Procardia since 5/9 PM. +FM  BP today: 144/85   Pt states having headaches since being at MAU. Denies any visual changes, swelling, abd pain.   Reviewed BP with Dr Vergie Living. Advised to continue to take 30mg  procardia as directed and keep 1 week ROB appt on 05/23/20 with fasting labs. Pt agreeable and verbalized understanding to plan of care.   05/25/20, RN  05/16/20

## 2020-05-17 NOTE — Progress Notes (Signed)
Patient was assessed and managed by nursing staff during this encounter. I have reviewed the chart and agree with the documentation and plan. I have also made any necessary editorial changes.  [place] Bing, MD 05/17/2020 11:14 AM

## 2020-05-20 ENCOUNTER — Other Ambulatory Visit: Payer: Self-pay | Admitting: General Practice

## 2020-05-20 DIAGNOSIS — O0990 Supervision of high risk pregnancy, unspecified, unspecified trimester: Secondary | ICD-10-CM

## 2020-05-23 ENCOUNTER — Other Ambulatory Visit: Payer: Self-pay

## 2020-05-23 ENCOUNTER — Ambulatory Visit (INDEPENDENT_AMBULATORY_CARE_PROVIDER_SITE_OTHER): Payer: Medicaid Other | Admitting: Obstetrics and Gynecology

## 2020-05-23 ENCOUNTER — Other Ambulatory Visit: Payer: Medicaid Other

## 2020-05-23 VITALS — BP 139/91 | HR 93 | Wt 282.5 lb

## 2020-05-23 DIAGNOSIS — O361920 Maternal care for other isoimmunization, second trimester, not applicable or unspecified: Secondary | ICD-10-CM

## 2020-05-23 DIAGNOSIS — Z3A28 28 weeks gestation of pregnancy: Secondary | ICD-10-CM

## 2020-05-23 DIAGNOSIS — R7989 Other specified abnormal findings of blood chemistry: Secondary | ICD-10-CM | POA: Diagnosis not present

## 2020-05-23 DIAGNOSIS — O0990 Supervision of high risk pregnancy, unspecified, unspecified trimester: Secondary | ICD-10-CM | POA: Diagnosis not present

## 2020-05-23 DIAGNOSIS — Z23 Encounter for immunization: Secondary | ICD-10-CM

## 2020-05-23 DIAGNOSIS — O99210 Obesity complicating pregnancy, unspecified trimester: Secondary | ICD-10-CM

## 2020-05-23 DIAGNOSIS — N879 Dysplasia of cervix uteri, unspecified: Secondary | ICD-10-CM

## 2020-05-23 DIAGNOSIS — Z6841 Body Mass Index (BMI) 40.0 and over, adult: Secondary | ICD-10-CM

## 2020-05-23 DIAGNOSIS — I10 Essential (primary) hypertension: Secondary | ICD-10-CM

## 2020-05-23 MED ORDER — MONTELUKAST SODIUM 10 MG PO TABS
10.0000 mg | ORAL_TABLET | Freq: Every day | ORAL | 2 refills | Status: DC
Start: 2020-05-23 — End: 2020-07-05

## 2020-05-23 MED ORDER — MAGNESIUM MALATE 1250 (141.7 MG) MG PO TABS: ORAL_TABLET | ORAL | 3 refills | Status: DC

## 2020-05-23 MED ORDER — PANTOPRAZOLE SODIUM 20 MG PO TBEC: 20.0000 mg | DELAYED_RELEASE_TABLET | Freq: Two times a day (BID) | ORAL | 1 refills | Status: DC | PRN

## 2020-05-23 NOTE — Patient Instructions (Signed)
Breast pumps:   [email or web address]   Double Electric Breast Pumps Specta S1 or S2 Medela Luna Motif Ameda

## 2020-05-23 NOTE — Progress Notes (Signed)
   PRENATAL VISIT NOTE  Subjective:  Carol Blair is a 30 y.o. [phone] at [redacted]w[redacted]d being seen today for ongoing prenatal care.  She is currently monitored for the following issues for this high-risk pregnancy and has Cervical dysplasia; Supervision of high risk pregnancy, antepartum; Chronic headaches; Chronic hypertension; BMI 40.0-44.9, adult (HCC); Obesity in pregnancy; History of severe pre-eclampsia; Low TSH level; Low serum vitamin D; and Lewis isoimmunization during pregnancy on their problem list.  Patient reports HA after taking GTT this morning. hasn't taken procardia yet today.  Contractions: Irritability. Vag. Bleeding: None.  Movement: Present. Denies leaking of fluid.   The following portions of the patient's history were reviewed and updated as appropriate: allergies, current medications, past family history, past medical history, past social history, past surgical history and problem list.   Objective:   Vitals:   05/23/20 0825  BP: (!) 139/91  Pulse: 93  Weight: 282 lb 8 oz (128.1 kg)    Fetal Status: Fetal Heart Rate (bpm): 142   Movement: Present     General:  Alert, oriented and cooperative. Patient is in no acute distress.  Skin: Skin is warm and dry. No rash noted.   Cardiovascular: Normal heart rate noted  Respiratory: Normal respiratory effort, no problems with respiration noted  Abdomen: Soft, gravid, appropriate for gestational age.  Pain/Pressure: Absent     Pelvic: Cervical exam deferred        Extremities: Normal range of motion.  Edema: None  Mental Status: Normal mood and affect. Normal behavior. Normal judgment and thought content.   Assessment and Plan:  Pregnancy: [id number] at [redacted]w[redacted]d 1. [redacted] weeks gestation of pregnancy  2. Cervical dysplasia Needs PP Pap  3. Supervision of high risk pregnancy, antepartum Routine care. 28wk labs today. HAs today could be from GTT or pt thinks allergies. Currently on zyrtec. Recommend switching to singulair and PRN Mg  (I also told her she could do it qday as preventative) and protonix sent in for her - Antibody screen - VITAMIN D 25 Hydroxy (Vit-D Deficiency, Fractures)  4. Chronic hypertension Currently on procardia xl 30 qday, switched from labetalol due to potentially causing HAs. Pt has ho chronic migraines. 5/5 growth north at 54%, 1027gm, ac 64%, afi 16. Has repeat growth in two weeks and scheduled to start testing at 32wks  5. Obesity in pregnancy  6. Low TSH level Rpt today  7. Lewis isoimmunization during pregnancy in second trimester, single or unspecified fetus - Antibody screen  8. Low serum vitamin D - VITAMIN D 25 Hydroxy (Vit-D Deficiency, Fractures)  9. BMI 40.0-44.9, adult (HCC)  Preterm labor symptoms and general obstetric precautions including but not limited to vaginal bleeding, contractions, leaking of fluid and fetal movement were reviewed in detail with the patient. Please refer to After Visit Summary for other counseling recommendations.   Return in about 2 weeks (around 06/06/2020) for md visit, in person, high risk.  Future Appointments  Date Time Provider Department Center  05/23/2020  8:50 AM [facility] LAB [facility]  06/06/2020  2:30 PM [facility] NURSE [facility]  06/06/2020  2:45 PM [facility] US5 [facility]  06/20/2020  2:30 PM [facility] NURSE [facility]  06/20/2020  2:45 PM [facility] US5 [facility]  06/27/2020  2:30 PM [facility] NURSE [facility]  06/27/2020  2:45 PM [facility] US5 [facility]    [place] Bing, MD

## 2020-05-23 NOTE — Addendum Note (Signed)
Addended by: Isabell Jarvis on: 05/23/2020 08:51 AM   Modules accepted: Orders

## 2020-05-23 NOTE — Progress Notes (Signed)
Spoke with mom about pumping and breast feeding after birth. She was not successful BF her older child and would like to try to at least pump with this infant.   Reviewed LC's in the hospital are available to help with latching and pumping. Reviewed OP services with mom.   Reviewed engorgement treatment  Reviewed pumps mom can purchase for pumping and where to get as she is interested in purchasing her own pump. Reviewed pumping and hand expressing in the hospital to stimulate milk production. Reviewed normal time for milk to come to volume.   Reviewed flange fit and sizes. Today she appears to need at # 27 flange and may change due to swelling after delivery. Reviewed how flanges should fit and when to change sizes.   Mom asked about BP meds and BF, reassured her the meds she is on now is ok for breast feeding.   Mom to call with any further questions or concerns.

## 2020-05-24 LAB — HIV ANTIBODY (ROUTINE TESTING W REFLEX): HIV Screen 4th Generation wRfx: NONREACTIVE

## 2020-05-24 LAB — GLUCOSE TOLERANCE, 2 HOURS W/ 1HR
Glucose, 1 hour: 133 mg/dL (ref 65–179)
Glucose, 2 hour: 122 mg/dL (ref 65–152)
Glucose, Fasting: 79 mg/dL (ref 65–91)

## 2020-05-24 LAB — CBC
Hematocrit: 34.4 % (ref 34.0–46.6)
Hemoglobin: 11.6 g/dL (ref 11.1–15.9)
MCH: 30.5 pg (ref 26.6–33.0)
MCHC: 33.7 g/dL (ref 31.5–35.7)
MCV: 91 fL (ref 79–97)
Platelets: 260 10*3/uL (ref 150–450)
RBC: 3.8 x10E6/uL (ref 3.77–5.28)
RDW: 12.2 % (ref 11.7–15.4)
WBC: 7.5 10*3/uL (ref 3.4–10.8)

## 2020-05-24 LAB — RPR: RPR Ser Ql: NONREACTIVE

## 2020-05-24 LAB — TSH: TSH: 0.273 u[IU]/mL — ABNORMAL LOW (ref 0.450–4.500)

## 2020-05-24 LAB — ANTIBODY SCREEN: Antibody Screen: NEGATIVE

## 2020-05-24 LAB — VITAMIN D 25 HYDROXY (VIT D DEFICIENCY, FRACTURES): Vit D, 25-Hydroxy: 31.6 ng/mL (ref 30.0–100.0)

## 2020-05-29 ENCOUNTER — Encounter (HOSPITAL_COMMUNITY): Payer: Self-pay | Admitting: Obstetrics & Gynecology

## 2020-05-29 ENCOUNTER — Observation Stay (HOSPITAL_COMMUNITY)
Admission: AD | Admit: 2020-05-29 | Discharge: 2020-05-30 | Disposition: A | Discharge status: Home / Self Care | Payer: Medicaid Other | Attending: Obstetrics & Gynecology | Admitting: Obstetrics & Gynecology

## 2020-05-29 ENCOUNTER — Other Ambulatory Visit: Payer: Self-pay

## 2020-05-29 DIAGNOSIS — Z8759 Personal history of other complications of pregnancy, childbirth and the puerperium: Secondary | ICD-10-CM

## 2020-05-29 DIAGNOSIS — Z3A29 29 weeks gestation of pregnancy: Secondary | ICD-10-CM | POA: Insufficient documentation

## 2020-05-29 DIAGNOSIS — O10013 Pre-existing essential hypertension complicating pregnancy, third trimester: Secondary | ICD-10-CM | POA: Insufficient documentation

## 2020-05-29 DIAGNOSIS — Z79899 Other long term (current) drug therapy: Secondary | ICD-10-CM | POA: Insufficient documentation

## 2020-05-29 DIAGNOSIS — R7989 Other specified abnormal findings of blood chemistry: Secondary | ICD-10-CM | POA: Insufficient documentation

## 2020-05-29 DIAGNOSIS — Z20822 Contact with and (suspected) exposure to covid-19: Secondary | ICD-10-CM | POA: Insufficient documentation

## 2020-05-29 DIAGNOSIS — O0990 Supervision of high risk pregnancy, unspecified, unspecified trimester: Secondary | ICD-10-CM

## 2020-05-29 DIAGNOSIS — O99210 Obesity complicating pregnancy, unspecified trimester: Secondary | ICD-10-CM | POA: Diagnosis present

## 2020-05-29 DIAGNOSIS — O10919 Unspecified pre-existing hypertension complicating pregnancy, unspecified trimester: Secondary | ICD-10-CM | POA: Diagnosis present

## 2020-05-29 DIAGNOSIS — Z7982 Long term (current) use of aspirin: Secondary | ICD-10-CM | POA: Diagnosis not present

## 2020-05-29 LAB — COMPREHENSIVE METABOLIC PANEL
ALT: 46 U/L — ABNORMAL HIGH (ref 0–44)
AST: 30 U/L (ref 15–41)
Albumin: 2.6 g/dL — ABNORMAL LOW (ref 3.5–5.0)
Alkaline Phosphatase: 114 U/L (ref 38–126)
Anion gap: 8 (ref 5–15)
BUN: 5 mg/dL — ABNORMAL LOW (ref 6–20)
CO2: 23 mmol/L (ref 22–32)
Calcium: 9 mg/dL (ref 8.9–10.3)
Chloride: 104 mmol/L (ref 98–111)
Creatinine, Ser: 0.64 mg/dL (ref 0.44–1.00)
GFR, Estimated: >60 mL/min (ref >60)
Glucose, Bld: 143 mg/dL — ABNORMAL HIGH (ref 70–99)
Potassium: 3.5 mmol/L (ref 3.5–5.1)
Sodium: 135 mmol/L (ref 135–145)
Total Bilirubin: 0.6 mg/dL (ref 0.3–1.2)
Total Protein: 6.6 g/dL (ref 6.5–8.1)

## 2020-05-29 LAB — CBC
HCT: 33.9 % — ABNORMAL LOW (ref 36.0–46.0)
Hemoglobin: 11.3 g/dL — ABNORMAL LOW (ref 12.0–15.0)
MCH: 30.6 pg (ref 26.0–34.0)
MCHC: 33.3 g/dL (ref 30.0–36.0)
MCV: 91.9 fL (ref 80.0–100.0)
Platelets: 264 10*3/uL (ref 150–400)
RBC: 3.69 MIL/uL — ABNORMAL LOW (ref 3.87–5.11)
RDW: 12.7 % (ref 11.5–15.5)
WBC: 8.2 10*3/uL (ref 4.0–10.5)
nRBC: 0 % (ref 0.0–0.2)

## 2020-05-29 LAB — URINALYSIS, ROUTINE W REFLEX MICROSCOPIC
Bilirubin Urine: NEGATIVE
Glucose, UA: NEGATIVE mg/dL
Hgb urine dipstick: NEGATIVE
Ketones, ur: 20 mg/dL — AB
Leukocytes,Ua: NEGATIVE
Nitrite: NEGATIVE
Protein, ur: 30 mg/dL — AB
Specific Gravity, Urine: 1.016 (ref 1.005–1.030)
pH: 6 (ref 5.0–8.0)

## 2020-05-29 LAB — RESP PANEL BY RT-PCR (FLU A&B, COVID) ARPGX2
Influenza A by PCR: NEGATIVE
Influenza B by PCR: NEGATIVE
SARS Coronavirus 2 by RT PCR: NEGATIVE

## 2020-05-29 LAB — PROTEIN / CREATININE RATIO, URINE
Creatinine, Urine: 221.5 mg/dL
Protein Creatinine Ratio: 0.13 mg/mg{Cre} (ref 0.00–0.15)
Total Protein, Urine: 29 mg/dL

## 2020-05-29 LAB — TYPE AND SCREEN
ABO/RH(D): O POS
Antibody Screen: NEGATIVE

## 2020-05-29 MED ORDER — CALCIUM CARBONATE ANTACID 500 MG PO CHEW: 2.0000 | CHEWABLE_TABLET | ORAL | Status: DC | PRN

## 2020-05-29 MED ORDER — ZOLPIDEM TARTRATE 5 MG PO TABS: 5.0000 mg | ORAL_TABLET | Freq: Every evening | ORAL | Status: DC | PRN

## 2020-05-29 MED ORDER — BUTALBITAL-APAP-CAFFEINE 50-325-40 MG PO TABS
1.0000 | ORAL_TABLET | Freq: Four times a day (QID) | ORAL | Status: DC | PRN
  Administered 2020-05-29 – 2020-05-30 (×3): 1 via ORAL
  Filled 2020-05-29 (×3): qty 1

## 2020-05-29 MED ORDER — ONDANSETRON HCL 4 MG PO TABS
4.0000 mg | ORAL_TABLET | Freq: Four times a day (QID) | ORAL | Status: DC | PRN
  Administered 2020-05-29 – 2020-05-30 (×2): 4 mg via ORAL
  Filled 2020-05-29 (×2): qty 1

## 2020-05-29 MED ORDER — LABETALOL HCL 5 MG/ML IV SOLN: 80.0000 mg | INTRAVENOUS | Status: DC | PRN

## 2020-05-29 MED ORDER — PRENATAL MULTIVITAMIN CH
1.0000 | ORAL_TABLET | Freq: Every day | ORAL | Status: DC
  Administered 2020-05-30: 1 via ORAL
  Filled 2020-05-29: qty 1

## 2020-05-29 MED ORDER — BUTALBITAL-APAP-CAFFEINE 50-325-40 MG PO TABS: 1.0000 | ORAL_TABLET | Freq: Four times a day (QID) | ORAL | Status: DC | PRN

## 2020-05-29 MED ORDER — BUTALBITAL-APAP-CAFFEINE 50-325-40 MG PO TABS
2.0000 | ORAL_TABLET | Freq: Once | ORAL | Status: AC
  Administered 2020-05-29: 2 via ORAL
  Filled 2020-05-29: qty 2

## 2020-05-29 MED ORDER — NIFEDIPINE ER OSMOTIC RELEASE 60 MG PO TB24
60.0000 mg | ORAL_TABLET | Freq: Every day | ORAL | Status: DC
  Administered 2020-05-29 – 2020-05-30 (×2): 60 mg via ORAL
  Filled 2020-05-29 (×2): qty 1

## 2020-05-29 MED ORDER — DOCUSATE SODIUM 100 MG PO CAPS
100.0000 mg | ORAL_CAPSULE | Freq: Every day | ORAL | Status: DC
  Administered 2020-05-30: 100 mg via ORAL
  Filled 2020-05-29: qty 1

## 2020-05-29 MED ORDER — ENOXAPARIN SODIUM 80 MG/0.8ML IJ SOSY
0.5000 mg/kg | PREFILLED_SYRINGE | INTRAMUSCULAR | Status: DC
  Administered 2020-05-30: 65 mg via SUBCUTANEOUS
  Filled 2020-05-29: qty 0.8

## 2020-05-29 MED ORDER — LABETALOL HCL 5 MG/ML IV SOLN
40.0000 mg | INTRAVENOUS | Status: DC | PRN
Start: 2020-05-29 — End: 2020-05-31

## 2020-05-29 MED ORDER — HYDRALAZINE HCL 20 MG/ML IJ SOLN: 10.0000 mg | INTRAMUSCULAR | Status: DC | PRN

## 2020-05-29 MED ORDER — LABETALOL HCL 5 MG/ML IV SOLN: 20.0000 mg | INTRAVENOUS | Status: DC | PRN

## 2020-05-29 MED ORDER — ACETAMINOPHEN 325 MG PO TABS: 650.0000 mg | ORAL_TABLET | ORAL | Status: DC | PRN

## 2020-05-29 NOTE — H&P (Signed)
FACULTY PRACTICE ANTEPARTUM ADMISSION HISTORY AND PHYSICAL NOTE   History of Present Illness: Carol Blair is a 30 y.o. [phone] at [redacted]w[redacted]d admitted for worsening chronic hypertension vs chronic hypertension with superimposed preeclampsia.  Presented to MAU today for worsening blood pressures and headache. Was switched from labetalol to procardia xl 30 mg daily a few weeks ago. Reports home BPs were 150s/80s this morning. Has had daily headaches for the last 3 weeks. Has been taking tylenol intermittently with no relief.  Headache in MAU resolved after fioricet . No severe range BPs but consistently elevated to 140s/90s. Labs normal except for elevated ALT which is more than 2x what it was 2 weeks ago.   Patient reports the fetal movement as active. Patient reports uterine contraction  activity as none. Patient reports  vaginal bleeding as none. Patient describes fluid per vagina as None.  Patient Active Problem List   Diagnosis Date Noted  . Chronic hypertension affecting pregnancy 05/29/2020  . Low TSH level 02/01/2020  . Low serum vitamin D 02/01/2020  . Lewis isoimmunization during pregnancy 02/01/2020  . Chronic hypertension 01/31/2020  . BMI 40.0-44.9, adult (HCC) 01/31/2020  . Obesity in pregnancy 01/31/2020  . History of severe pre-eclampsia 01/31/2020  . Chronic headaches 08/29/2018  . Supervision of high risk pregnancy, antepartum 06/13/2015  . Cervical dysplasia 04/09/2011    Past Medical History:  Diagnosis Date  . Benign essential hypertension antepartum 02/11/2011  . Obesity   . Vaginal Pap smear, abnormal     Past Surgical History:  Procedure Laterality Date  . LAPAROSCOPIC CHOLECYSTECTOMY W/ CHOLANGIOGRAPHY  06/10/10   Dr Gaynelle Adu    OB History  Gravida Para Term Preterm AB Living  5 3 3   1 3   SAB IAB Ectopic Multiple Live Births  1 0 0 0 3    # Outcome Date GA Lbr Len/2nd Weight Sex Delivery Anes PTL Lv  5 Current           4 SAB 2021          3 Term  12/29/15 [redacted]w[redacted]d    Vag-Spont None    2 Term 09/16/11 [redacted]w[redacted]d 02:51 / 00:05 2761 g M Vag-Spont None  LIV     Birth Comments: wnl  1 Term 04/16/10 [redacted]w[redacted]d  2776 g F Vag-Spont EPI  LIV     Birth Comments: System Generated. Please review and update pregnancy details.    Social History   Socioeconomic History  . Marital status: Single    Spouse name: Not on file  . Number of children: Not on file  . Years of education: Not on file  . Highest education level: Not on file  Occupational History  . Not on file  Tobacco Use  . Smoking status: Never Smoker  . Smokeless tobacco: Never Used  Vaping Use  . Vaping Use: Never used  Substance and Sexual Activity  . Alcohol use: No  . Drug use: No  . Sexual activity: Not Currently    Birth control/protection: I.U.D.  Other Topics Concern  . Not on file  Social History Narrative  . Not on file   Social Determinants of Health   Financial Resource Strain: Not on file  Food Insecurity: No Food Insecurity  . Worried About [redacted]w[redacted]d in the Last Year: Never true  . Ran Out of Food in the Last Year: Never true  Transportation Needs: No Transportation Needs  . Lack of Transportation (Medical): No  . Lack of  Transportation (Non-Medical): No  Physical Activity: Not on file  Stress: Not on file  Social Connections: Not on file    Family History  Problem Relation Age of Onset  . Hypertension Mother   . Hypertension Sister   . Kidney disease Sister   . Cancer Maternal Grandmother        GI  . Breast cancer Maternal Aunt   . Cancer Maternal Uncle        colon    Allergies  Allergen Reactions  . Codeine Nausea And Vomiting and Other (See Comments)    Shaking, sweating    Medications Prior to Admission  Medication Sig Dispense Refill Last Dose  . acetaminophen (TYLENOL) 500 MG tablet Take 1,000 mg by mouth every 6 (six) hours as needed for headache.   05/28/2020 at Unknown time  . aspirin EC 81 MG tablet Take 1 tablet (81 mg  total) by mouth daily. 60 tablet 1 05/28/2020 at Unknown time  . NIFEdipine (PROCARDIA XL) 30 MG 24 hr tablet Take 1 tablet (30 mg total) by mouth daily. 30 tablet 2 05/29/2020 at 0830  . Prenatal Vit-Fe Fumarate-FA (PRENATAL VITAMIN AND MINERAL) 28-0.8 MG TABS Take 1 tablet by mouth daily. 60 tablet 2 05/29/2020 at Unknown time  . promethazine (PHENERGAN) 25 MG tablet Take 1 tablet (25 mg total) by mouth every 6 (six) hours as needed for nausea or vomiting. 30 tablet 2 Past Week at Unknown time  . Magnesium Malate 1250 (141.7 Mg) MG TABS 1 tab po qday or qday prn for HA 30 tablet 3   . montelukast (SINGULAIR) 10 MG tablet Take 1 tablet (10 mg total) by mouth at bedtime. 30 tablet 2   . pantoprazole (PROTONIX) 20 MG tablet Take 1 tablet (20 mg total) by mouth 2 (two) times daily as needed. 60 tablet 1     Review of Systems - Negative except headaches  Vitals:  BP 138/83   Pulse 87   Temp 98.5 F (36.9 C) (Oral)   Resp 19   LMP 11/04/2019   SpO2 100%  Physical Examination: CONSTITUTIONAL: Well-developed, well-nourished female in no acute distress.  HENT:  Normocephalic, atraumatic, External right and left ear normal. Oropharynx is clear and moist EYES: Conjunctivae and EOM are normal. Pupils are equal, round, and reactive to light. No scleral icterus.  NECK: Normal range of motion, supple, no masses SKIN: Skin is warm and dry. No rash noted. Not diaphoretic. No erythema. No pallor. NEUROLGIC: Alert and oriented to person, place, and time. Normal reflexes, muscle tone coordination. No cranial nerve deficit noted. PSYCHIATRIC: Normal mood and affect. Normal behavior. Normal judgment and thought content. CARDIOVASCULAR: Normal heart rate noted, regular rhythm RESPIRATORY: Effort and breath sounds normal, no problems with respiration noted ABDOMEN: Soft, nontender, nondistended, gravid. MUSCULOSKELETAL: Normal range of motion. No edema and no tenderness. 2+ distal pulses.   Fetal  Monitoring:Baseline: 135 bpm, Variability: Good {> 6 bpm), Accelerations: Non-reactive but appropriate for gestational age and Decelerations: Absent Tocometer: Flat  Labs:  Results for orders placed or performed during the hospital encounter of 05/29/20 (from the past 24 hour(s))  Urinalysis, Routine w reflex microscopic Urine, Clean Catch   Collection Time: 05/29/20  4:25 PM  Result Value Ref Range   Color, Urine YELLOW YELLOW   APPearance HAZY (A) CLEAR   Specific Gravity, Urine 1.016 1.005 - 1.030   pH 6.0 5.0 - 8.0   Glucose, UA NEGATIVE NEGATIVE mg/dL   Hgb urine dipstick NEGATIVE NEGATIVE  Bilirubin Urine NEGATIVE NEGATIVE   Ketones, ur 20 (A) NEGATIVE mg/dL   Protein, ur 30 (A) NEGATIVE mg/dL   Nitrite NEGATIVE NEGATIVE   Leukocytes,Ua NEGATIVE NEGATIVE   RBC / HPF 0-5 0 - 5 RBC/hpf   WBC, UA 0-5 0 - 5 WBC/hpf   Bacteria, UA FEW (A) NONE SEEN   Squamous Epithelial / LPF 0-5 0 - 5   Mucus PRESENT   Protein / creatinine ratio, urine   Collection Time: 05/29/20  4:25 PM  Result Value Ref Range   Creatinine, Urine 221.50 mg/dL   Total Protein, Urine 29 mg/dL   Protein Creatinine Ratio 0.13 0.00 - 0.15 mg/mg[Cre]  CBC   Collection Time: 05/29/20  4:49 PM  Result Value Ref Range   WBC 8.2 4.0 - 10.5 K/uL   RBC 3.69 (L) 3.87 - 5.11 MIL/uL   Hemoglobin 11.3 (L) 12.0 - 15.0 g/dL   HCT 35.7 (L) 01.7 - 79.3 %   MCV 91.9 80.0 - 100.0 fL   MCH 30.6 26.0 - 34.0 pg   MCHC 33.3 30.0 - 36.0 g/dL   RDW 90.3 00.9 - 23.3 %   Platelets 264 150 - 400 K/uL   nRBC 0.0 0.0 - 0.2 %  Comprehensive metabolic panel   Collection Time: 05/29/20  4:49 PM  Result Value Ref Range   Sodium 135 135 - 145 mmol/L   Potassium 3.5 3.5 - 5.1 mmol/L   Chloride 104 98 - 111 mmol/L   CO2 23 22 - 32 mmol/L   Glucose, Bld 143 (H) 70 - 99 mg/dL   BUN 5 (L) 6 - 20 mg/dL   Creatinine, Ser 0.07 0.44 - 1.00 mg/dL   Calcium 9.0 8.9 - 62.2 mg/dL   Total Protein 6.6 6.5 - 8.1 g/dL   Albumin 2.6 (L) 3.5 -  5.0 g/dL   AST 30 15 - 41 U/L   ALT 46 (H) 0 - 44 U/L   Alkaline Phosphatase 114 38 - 126 U/L   Total Bilirubin 0.6 0.3 - 1.2 mg/dL   GFR, Estimated >63 >33 mL/min   Anion gap 8 5 - 15    Assessment and Plan: 1. Chronic hypertension affecting pregnancy   2. Elevated LFTs   3. [redacted] weeks gestation of pregnancy    -Admit to observe on OBSC unit for [facility] exacerbation vs superimposed preeclampsia -collect 24 hour urine -monitor blood pressures & adjust medication as needed -recheck labs tomorrow  Judeth Horn, NP 05/29/2020 6:26 PM

## 2020-05-29 NOTE — MAU Provider Note (Signed)
History     CSN: [id number]  Arrival date and time: 05/29/20 1554   Event Date/Time   First Provider Initiated Contact with Patient 05/29/20 1631      Chief Complaint  Patient presents with  . Hypertension   HPI Carol Blair is a 30 y.o. [id number] at [redacted]w[redacted]d who presents for hypertension & headache. Patient has chronic hypertension; switched to procardia a few weeks ago. Takes procardia xl 30 mg daily; was told she would be increased to 60 mg if BPs don't improve so took an extra dose this morning. States her BPs this morning were 150s/80s.  Has had frequent headaches for the last 3 weeks. Has been taking tylenol without relief. Last took tylenol yesterday. Rates headache 7/10. Nothing makes better worse. Denies visual disturbance or epigastric pain. Denies contractions, leaking of fluid, or vaginal bleeding. Reports good fetal movement.   OB History    Gravida  5   Para  3   Term  3   Preterm      AB  1   Living  3     SAB  1   IAB  0   Ectopic  0   Multiple  0   Live Births  3           Past Medical History:  Diagnosis Date  . Benign essential hypertension antepartum 02/11/2011  . Obesity   . Pregnancy induced hypertension   . Vaginal Pap smear, abnormal     Past Surgical History:  Procedure Laterality Date  . LAPAROSCOPIC CHOLECYSTECTOMY W/ CHOLANGIOGRAPHY  06/10/10   Dr Gaynelle Adu    Family History  Problem Relation Age of Onset  . Hypertension Mother   . Hypertension Sister   . Kidney disease Sister   . Cancer Maternal Grandmother        GI  . Breast cancer Maternal Aunt   . Cancer Maternal Uncle        colon    Social History   Tobacco Use  . Smoking status: Never Smoker  . Smokeless tobacco: Never Used  Vaping Use  . Vaping Use: Never used  Substance Use Topics  . Alcohol use: No  . Drug use: No    Allergies:  Allergies  Allergen Reactions  . Codeine Nausea And Vomiting and Other (See Comments)    Shaking, sweating     Medications Prior to Admission  Medication Sig Dispense Refill Last Dose  . acetaminophen (TYLENOL) 500 MG tablet Take 1,000 mg by mouth every 6 (six) hours as needed for headache.   05/28/2020 at Unknown time  . aspirin EC 81 MG tablet Take 1 tablet (81 mg total) by mouth daily. 60 tablet 1 05/28/2020 at Unknown time  . NIFEdipine (PROCARDIA XL) 30 MG 24 hr tablet Take 1 tablet (30 mg total) by mouth daily. 30 tablet 2 05/29/2020 at 0830  . Prenatal Vit-Fe Fumarate-FA (PRENATAL VITAMIN AND MINERAL) 28-0.8 MG TABS Take 1 tablet by mouth daily. 60 tablet 2 05/29/2020 at Unknown time  . promethazine (PHENERGAN) 25 MG tablet Take 1 tablet (25 mg total) by mouth every 6 (six) hours as needed for nausea or vomiting. 30 tablet 2 Past Week at Unknown time  . Magnesium Malate 1250 (141.7 Mg) MG TABS 1 tab po qday or qday prn for HA 30 tablet 3   . montelukast (SINGULAIR) 10 MG tablet Take 1 tablet (10 mg total) by mouth at bedtime. 30 tablet 2   . pantoprazole (PROTONIX) 20  MG tablet Take 1 tablet (20 mg total) by mouth 2 (two) times daily as needed. 60 tablet 1     Review of Systems  Constitutional: Negative.   Eyes: Negative for photophobia and visual disturbance.  Gastrointestinal: Negative.   Genitourinary: Negative.   Neurological: Positive for headaches.   Physical Exam   Blood pressure (!) 146/99, pulse 99, temperature 98.5 F (36.9 C), temperature source Oral, resp. rate 19, last menstrual period 11/04/2019, SpO2 100 %, unknown if currently breastfeeding.  Physical Exam Vitals and nursing note reviewed.  Constitutional:      General: She is not in acute distress.    Appearance: Normal appearance.  HENT:     Head: Normocephalic and atraumatic.  Eyes:     General: No scleral icterus. Pulmonary:     Effort: Pulmonary effort is normal. No respiratory distress.  Skin:    General: Skin is warm and dry.  Neurological:     Mental Status: She is alert.  Psychiatric:        Mood and  Affect: Mood normal.        Behavior: Behavior normal.    NST:  Baseline: 135 bpm, Variability: Good {> 6 bpm), Accelerations: Non-reactive but appropriate for gestational age and Decelerations: Absent  MAU Course  Procedures Results for orders placed or performed during the hospital encounter of 05/29/20 (from the past 24 hour(s))  Urinalysis, Routine w reflex microscopic Urine, Clean Catch     Status: Abnormal   Collection Time: 05/29/20  4:25 PM  Result Value Ref Range   Color, Urine YELLOW YELLOW   APPearance HAZY (A) CLEAR   Specific Gravity, Urine 1.016 1.005 - 1.030   pH 6.0 5.0 - 8.0   Glucose, UA NEGATIVE NEGATIVE mg/dL   Hgb urine dipstick NEGATIVE NEGATIVE   Bilirubin Urine NEGATIVE NEGATIVE   Ketones, ur 20 (A) NEGATIVE mg/dL   Protein, ur 30 (A) NEGATIVE mg/dL   Nitrite NEGATIVE NEGATIVE   Leukocytes,Ua NEGATIVE NEGATIVE   RBC / HPF 0-5 0 - 5 RBC/hpf   WBC, UA 0-5 0 - 5 WBC/hpf   Bacteria, UA FEW (A) NONE SEEN   Squamous Epithelial / LPF 0-5 0 - 5   Mucus PRESENT   Protein / creatinine ratio, urine     Status: None   Collection Time: 05/29/20  4:25 PM  Result Value Ref Range   Creatinine, Urine 221.50 mg/dL   Total Protein, Urine 29 mg/dL   Protein Creatinine Ratio 0.13 0.00 - 0.15 mg/mg[Cre]  CBC     Status: Abnormal   Collection Time: 05/29/20  4:49 PM  Result Value Ref Range   WBC 8.2 4.0 - 10.5 K/uL   RBC 3.69 (L) 3.87 - 5.11 MIL/uL   Hemoglobin 11.3 (L) 12.0 - 15.0 g/dL   HCT 89.1 (L) 69.4 - 50.3 %   MCV 91.9 80.0 - 100.0 fL   MCH 30.6 26.0 - 34.0 pg   MCHC 33.3 30.0 - 36.0 g/dL   RDW 88.8 28.0 - 03.4 %   Platelets 264 150 - 400 K/uL   nRBC 0.0 0.0 - 0.2 %  Comprehensive metabolic panel     Status: Abnormal   Collection Time: 05/29/20  4:49 PM  Result Value Ref Range   Sodium 135 135 - 145 mmol/L   Potassium 3.5 3.5 - 5.1 mmol/L   Chloride 104 98 - 111 mmol/L   CO2 23 22 - 32 mmol/L   Glucose, Bld 143 (H) 70 - 99 mg/dL  BUN 5 (L) 6 - 20 mg/dL    Creatinine, Ser 9.62 0.44 - 1.00 mg/dL   Calcium 9.0 8.9 - 95.2 mg/dL   Total Protein 6.6 6.5 - 8.1 g/dL   Albumin 2.6 (L) 3.5 - 5.0 g/dL   AST 30 15 - 41 U/L   ALT 46 (H) 0 - 44 U/L   Alkaline Phosphatase 114 38 - 126 U/L   Total Bilirubin 0.6 0.3 - 1.2 mg/dL   GFR, Estimated >84 >13 mL/min   Anion gap 8 5 - 15  Type and screen [facility]     Status: None (Preliminary result)   Collection Time: 05/29/20  6:16 PM  Result Value Ref Range   ABO/RH(D) PENDING    Antibody Screen PENDING    Sample Expiration      06/01/2020,2359 Performed at [facility], [address], [place], [place] [address]     MDM Elevated BPs in MAU; none severe range. Headache resolved with fioricet. Protein creatinine ratio, platelets, and serum creatinine are normal. ALT is elevated to 46 which is more than double what it was 2 weeks ago. Discussed patient with Dr. Charlotta Newton who recommends observing on OBSC unit to monitor & recheck labs in the morning.   Assessment and Plan   1. Chronic hypertension affecting pregnancy   2. Elevated LFTs   3. [redacted] weeks gestation of pregnancy    -worsening CHTN vs superimposed preeclampsia -patient agreeable with plan -recheck labs tomorrow & collect 24 hour urine  Judeth Horn 05/29/2020, 4:31 PM

## 2020-05-29 NOTE — MAU Note (Addendum)
...  Carol Blair is a 30 y.o. at [redacted]w[redacted]d here in MAU reporting: HA every day for the past week that is currently a 7/10. States she took her blood pressure this morning around 0700 and it was elevated. She states she took Procardia at 0830 even though she took it last night and has been resting all day but her headache would not go away so she took her blood pressure and it was 158/97. Denies taking any Tylenol as "I saw that there was a case open about Tylenol causing autism and birth defects so I don't want to keep taking it." +FM. No VB or LOF. Denies visual disturbances and epigastric pain. No edema noted.   Pain score: 7/10 HA Lab orders placed from triage:  UA

## 2020-05-29 NOTE — Progress Notes (Signed)
24 hour urine started at 1845.

## 2020-05-30 DIAGNOSIS — O10913 Unspecified pre-existing hypertension complicating pregnancy, third trimester: Secondary | ICD-10-CM | POA: Diagnosis not present

## 2020-05-30 DIAGNOSIS — Z3A29 29 weeks gestation of pregnancy: Secondary | ICD-10-CM

## 2020-05-30 DIAGNOSIS — O10919 Unspecified pre-existing hypertension complicating pregnancy, unspecified trimester: Secondary | ICD-10-CM

## 2020-05-30 LAB — COMPREHENSIVE METABOLIC PANEL
ALT: 41 U/L (ref 0–44)
AST: 26 U/L (ref 15–41)
Albumin: 2.5 g/dL — ABNORMAL LOW (ref 3.5–5.0)
Alkaline Phosphatase: 103 U/L (ref 38–126)
Anion gap: 8 (ref 5–15)
BUN: <5 mg/dL — ABNORMAL LOW (ref 6–20)
CO2: 23 mmol/L (ref 22–32)
Calcium: 9.1 mg/dL (ref 8.9–10.3)
Chloride: 105 mmol/L (ref 98–111)
Creatinine, Ser: 0.64 mg/dL (ref 0.44–1.00)
GFR, Estimated: >60 mL/min (ref >60)
Glucose, Bld: 101 mg/dL — ABNORMAL HIGH (ref 70–99)
Potassium: 3.7 mmol/L (ref 3.5–5.1)
Sodium: 136 mmol/L (ref 135–145)
Total Bilirubin: 0.5 mg/dL (ref 0.3–1.2)
Total Protein: 6.5 g/dL (ref 6.5–8.1)

## 2020-05-30 LAB — CBC
HCT: 32.8 % — ABNORMAL LOW (ref 36.0–46.0)
Hemoglobin: 10.8 g/dL — ABNORMAL LOW (ref 12.0–15.0)
MCH: 30.4 pg (ref 26.0–34.0)
MCHC: 32.9 g/dL (ref 30.0–36.0)
MCV: 92.4 fL (ref 80.0–100.0)
Platelets: 245 10*3/uL (ref 150–400)
RBC: 3.55 MIL/uL — ABNORMAL LOW (ref 3.87–5.11)
RDW: 12.7 % (ref 11.5–15.5)
WBC: 8.3 10*3/uL (ref 4.0–10.5)
nRBC: 0 % (ref 0.0–0.2)

## 2020-05-30 LAB — PROTEIN, URINE, 24 HOUR
Collection Interval-UPROT: 24 hours
Protein, 24H Urine: 180 mg/d — ABNORMAL HIGH (ref 50–100)
Protein, Urine: 12 mg/dL
Urine Total Volume-UPROT: 1500 mL

## 2020-05-30 MED ORDER — ASPIRIN EC 81 MG PO TBEC
162.0000 mg | DELAYED_RELEASE_TABLET | Freq: Every day | ORAL | Status: DC
  Administered 2020-05-30: 162 mg via ORAL
  Filled 2020-05-30: qty 2

## 2020-05-30 MED ORDER — NIFEDIPINE ER OSMOTIC RELEASE 60 MG PO TB24
60.0000 mg | ORAL_TABLET | Freq: Every day | ORAL | 2 refills | Status: DC
Start: 2020-05-30 — End: 2020-07-05

## 2020-05-30 MED ORDER — BUTALBITAL-APAP-CAFFEINE 50-325-40 MG PO TABS: 1.0000 | ORAL_TABLET | ORAL | 1 refills | Status: DC | PRN

## 2020-05-30 NOTE — Progress Notes (Signed)
RN provided discharge instructions to patient. Patient confirmed understanding of instructions and did not have any questions. Patient to be discharged home in stable condition. Raelyn Ensign RN 05/30/2020

## 2020-05-30 NOTE — Discharge Summary (Signed)
Antenatal Physician Discharge Summary  Patient ID: LINDAY RHODES MRN: [id number] DOB/AGE: 1990/09/20 30 y.o.  Admit date: 05/29/2020 Discharge date: 05/30/2020  Admission Diagnoses: Chronic hypertension at [redacted] weeks gestation, headaches, concern about superimposed preeclampsia  Discharge Diagnoses: The same  Prenatal Procedures: NST and Preeclampsia evaluation  Hospital Course:  VIKTORIA GRUETZMACHER is a 30 y.o. [phone] with IUP at [redacted]w[redacted]d admitted for observation for worsening BP and headaches.  Headaches were successfully managed on Fioricet, BP never got to severe range.  No other severe features, labs reassuring, negative urine Pr:Cr. 24 hour protein analysis pending at time of discharge.  Her Procardia XL dosage was increased from 30 mg to 60 mg for better BP control.  Her fetal heart rate monitoring remained reassuring, and she had no signs/symptoms of progressing to preeclampsia,  preterm labor or other maternal-fetal concerns.   She was deemed stable for discharge to home with outpatient follow up.  Discharge Exam: Temp:  [98 F (36.7 C)-98.6 F (37 C)] 98.6 F (37 C) (05/26 1554) Pulse Rate:  [67-84] 67 (05/26 1555) Resp:  [16-18] 16 (05/26 1554) BP: (131-153)/(66-91) 148/82 (05/26 1555) SpO2:  [99 %-100 %] 100 % (05/26 1554) Physical Examination: General appearance - alert, well appearing, and in no distress Mental status - normal mood, behavior, speech, dress, motor activity, and thought processes Chest - clear to auscultation, no wheezes, rales or rhonchi, symmetric air entry Heart - normal rate and regular rhythm Abdomen - gravid and non-tender Extremities - no pedal edema noted, no calf tenderness, no clonus Skin - warm and dry  Fetal monitoring: FHR: 135 bpm, Variability: moderate, Accelerations: 10 x 10 Present, Decelerations: Absent  Uterine activity: No contractions  Significant Diagnostic Studies:  Results for orders placed or performed during the hospital encounter of  05/29/20 (from the past 168 hour(s))  Urinalysis, Routine w reflex microscopic Urine, Clean Catch   Collection Time: 05/29/20  4:25 PM  Result Value Ref Range   Color, Urine YELLOW YELLOW   APPearance HAZY (A) CLEAR   Specific Gravity, Urine 1.016 1.005 - 1.030   pH 6.0 5.0 - 8.0   Glucose, UA NEGATIVE NEGATIVE mg/dL   Hgb urine dipstick NEGATIVE NEGATIVE   Bilirubin Urine NEGATIVE NEGATIVE   Ketones, ur 20 (A) NEGATIVE mg/dL   Protein, ur 30 (A) NEGATIVE mg/dL   Nitrite NEGATIVE NEGATIVE   Leukocytes,Ua NEGATIVE NEGATIVE   RBC / HPF 0-5 0 - 5 RBC/hpf   WBC, UA 0-5 0 - 5 WBC/hpf   Bacteria, UA FEW (A) NONE SEEN   Squamous Epithelial / LPF 0-5 0 - 5   Mucus PRESENT   Protein / creatinine ratio, urine   Collection Time: 05/29/20  4:25 PM  Result Value Ref Range   Creatinine, Urine 221.50 mg/dL   Total Protein, Urine 29 mg/dL   Protein Creatinine Ratio 0.13 0.00 - 0.15 mg/mg[Cre]  CBC   Collection Time: 05/29/20  4:49 PM  Result Value Ref Range   WBC 8.2 4.0 - 10.5 K/uL   RBC 3.69 (L) 3.87 - 5.11 MIL/uL   Hemoglobin 11.3 (L) 12.0 - 15.0 g/dL   HCT 78.6 (L) 76.7 - 20.9 %   MCV 91.9 80.0 - 100.0 fL   MCH 30.6 26.0 - 34.0 pg   MCHC 33.3 30.0 - 36.0 g/dL   RDW 47.0 96.2 - 83.6 %   Platelets 264 150 - 400 K/uL   nRBC 0.0 0.0 - 0.2 %  Comprehensive metabolic panel   Collection Time:  05/29/20  4:49 PM  Result Value Ref Range   Sodium 135 135 - 145 mmol/L   Potassium 3.5 3.5 - 5.1 mmol/L   Chloride 104 98 - 111 mmol/L   CO2 23 22 - 32 mmol/L   Glucose, Bld 143 (H) 70 - 99 mg/dL   BUN 5 (L) 6 - 20 mg/dL   Creatinine, Ser 8.650.64 0.44 - 1.00 mg/dL   Calcium 9.0 8.9 - 78.410.3 mg/dL   Total Protein 6.6 6.5 - 8.1 g/dL   Albumin 2.6 (L) 3.5 - 5.0 g/dL   AST 30 15 - 41 U/L   ALT 46 (H) 0 - 44 U/L   Alkaline Phosphatase 114 38 - 126 U/L   Total Bilirubin 0.6 0.3 - 1.2 mg/dL   GFR, Estimated >69>60 >62>60 mL/min   Anion gap 8 5 - 15  Resp Panel by RT-PCR (Flu A&B, Covid) Nasopharyngeal Swab    Collection Time: 05/29/20  6:15 PM   Specimen: Nasopharyngeal Swab; Nasopharyngeal(NP) swabs in vial transport medium  Result Value Ref Range   SARS Coronavirus 2 by RT PCR NEGATIVE NEGATIVE   Influenza A by PCR NEGATIVE NEGATIVE   Influenza B by PCR NEGATIVE NEGATIVE  Type and screen [facility]   Collection Time: 05/29/20  6:16 PM  Result Value Ref Range   ABO/RH(D) O POS    Antibody Screen NEG    Sample Expiration      06/01/2020,2359 Performed at [facility], [address] [place], [place] [address]   Comprehensive metabolic panel   Collection Time: 05/30/20  6:00 AM  Result Value Ref Range   Sodium 136 135 - 145 mmol/L   Potassium 3.7 3.5 - 5.1 mmol/L   Chloride 105 98 - 111 mmol/L   CO2 23 22 - 32 mmol/L   Glucose, Bld 101 (H) 70 - 99 mg/dL   BUN <5 (L) 6 - 20 mg/dL   Creatinine, Ser 1.320.64 0.44 - 1.00 mg/dL   Calcium 9.1 8.9 - 44.010.3 mg/dL   Total Protein 6.5 6.5 - 8.1 g/dL   Albumin 2.5 (L) 3.5 - 5.0 g/dL   AST 26 15 - 41 U/L   ALT 41 0 - 44 U/L   Alkaline Phosphatase 103 38 - 126 U/L   Total Bilirubin 0.5 0.3 - 1.2 mg/dL   GFR, Estimated >10>60 >27>60 mL/min   Anion gap 8 5 - 15  CBC   Collection Time: 05/30/20  6:00 AM  Result Value Ref Range   WBC 8.3 4.0 - 10.5 K/uL   RBC 3.55 (L) 3.87 - 5.11 MIL/uL   Hemoglobin 10.8 (L) 12.0 - 15.0 g/dL   HCT 25.332.8 (L) 66.436.0 - 40.346.0 %   MCV 92.4 80.0 - 100.0 fL   MCH 30.4 26.0 - 34.0 pg   MCHC 32.9 30.0 - 36.0 g/dL   RDW 47.412.7 25.911.5 - 56.315.5 %   Platelets 245 150 - 400 K/uL   nRBC 0.0 0.0 - 0.2 %   US MFM OB FOLLOW UP  Result Date: 05/09/2020 ----------------------------------------------------------------------  OBSTETRICS REPORT                       (Signed Final 05/09/2020 03:13 pm) ---------------------------------------------------------------------- Patient Info  ID #:       [id number]                          D.O.B.:  April 10, 1990 (30 yrs)  Name:  Carol Blair Philbert                 Visit Date: 05/09/2020  02:15 pm ---------------------------------------------------------------------- Performed By  Attending:        Noralee Space MD        Referred By:      Milus Mallick                                                             PRAY  Performed By:     Fayne Norrie BS,      [facility]                                [facility] ---------------------------------------------------------------------- Orders  #  Description                           Code        Ordered By  1  [place] MFM OB FOLLOW UP                   76816.01    YU FANG ----------------------------------------------------------------------  #  Order #                     Accession #                Episode #  1  [id number]                   [id number]                 [id number] ---------------------------------------------------------------------- Indications  [redacted] weeks gestation of pregnancy                Z3A.26  Hypertension - Chronic/Pre-existing            O10.019  (Labetalol)  Obesity complicating pregnancy, first          O99.211  trimester (Pre-G BMI 46) ---------------------------------------------------------------------- Fetal Evaluation  Num Of Fetuses:         1  Fetal Heart Rate(bpm):  145  Cardiac Activity:       Observed  Presentation:           Cephalic  Placenta:  Anterior  P. Cord Insertion:      Previously Visualized  Amniotic Fluid  AFI FV:      Within normal limits  AFI Sum(cm)     %Tile       Largest Pocket(cm)  15.84           57          5.05  RUQ(cm)       RLQ(cm)       LUQ(cm)        LLQ(cm)  5.05          2.59          4.75           3.45 ---------------------------------------------------------------------- Biometry  BPD:      66.5  mm     G. Age:  26w 6d         42  %    CI:        74.26   %    70 - 86                                                           FL/HC:      20.3   %    18.6 - 20.4  HC:       245   mm     G. Age:  26w 4d         20  %    HC/AC:      1.06        1.05 - 1.21  AC:      230.8  mm     G. Age:  27w 3d         64  %    FL/BPD:     74.9   %    71 - 87  FL:       49.8  mm     G. Age:  26w 6d         38  %    FL/AC:      21.6   %    20 - 24  LV:        3.9  mm  Est. FW:    1027  gm      2 lb 4 oz     54  % ---------------------------------------------------------------------- OB History  Gravidity:    5         Term:   3         SAB:   1  Living:       3 ---------------------------------------------------------------------- Gestational Age  LMP:           26w 5d        Date:  11/04/19                 EDD:   08/10/20  U/S Today:     27w 0d                                        EDD:   08/08/20  Best:          26w 5d     Det. By:  LMP  (11/04/19)  EDD:   08/10/20 ---------------------------------------------------------------------- Anatomy  Cranium:               Appears normal         Aortic Arch:            Previously seen  Cavum:                 Appears normal         Ductal Arch:            Previously seen  Ventricles:            Appears normal         Diaphragm:              Appears normal  Choroid Plexus:        Previously seen        Stomach:                Appears normal, left                                                                        sided  Cerebellum:            Previously seen        Abdomen:                Appears normal  Posterior Fossa:       Previously seen        Abdominal Wall:         Previously seen  Nuchal Fold:           Previously seen        Cord Vessels:           Previously seen  Face:                  Orbits and profile     Kidneys:                Appear normal                         previously seen  Lips:                  Previously seen        Bladder:                Appears normal  Thoracic:              Appears normal         Spine:                  Previously seen  Heart:                  Appears normal         Upper Extremities:      Previously seen                         (4CH, axis, and                         situs)  RVOT:  Previously seen        Lower Extremities:      Previously seen  LVOT:                  Appears normal  Other:  Fetus previously appears to be a female. ---------------------------------------------------------------------- Cervix Uterus Adnexa  Cervix  Length:           3.22  cm.  Normal appearance by transabdominal scan. ---------------------------------------------------------------------- Impression  Chronic hypertension. Well-controlled on labetalol .  BP at our office: 117/65 mm Hg .  Amniotic fluid is normal and good fetal activity is seen .Fetal  growth is appropriate for gestational age . ---------------------------------------------------------------------- Recommendations  -An appointment was made for her to return in 4 weeks for  fetal growth assessment.  -Weekly BPP from 32 weeks' gestation till delivery. ----------------------------------------------------------------------                  Noralee Space, MD Electronically Signed Final Report   05/09/2020 03:13 pm ----------------------------------------------------------------------   Future Appointments  Date Time Provider Department Center  06/06/2020  2:30 PM [facility] NURSE [facility]  06/06/2020  2:45 PM [facility] US5 [facility]  06/10/2020  8:35 AM [place] Bing, MD [facility]  06/20/2020  2:30 PM [facility] NURSE [facility]  06/20/2020  2:45 PM [facility] US5 [facility]  06/27/2020  2:30 PM [facility] NURSE [facility]  06/27/2020  2:45 PM [facility] US5 [facility]    Discharge Condition: Stable  Discharge disposition: 01-Home or Self Care   Allergies as of 05/30/2020      Reactions   Codeine Nausea And Vomiting, Other (See Comments)   Shaking, sweating      Medication List    STOP taking these medications   acetaminophen 500 MG tablet Commonly known as:  TYLENOL     TAKE these medications   aspirin EC 81 MG tablet Take 1 tablet (81 mg total) by mouth daily.   butalbital-acetaminophen-caffeine 50-325-40 MG tablet Commonly known as: FIORICET Take 1 tablet by mouth every 4 (four) hours as needed for headache.   Magnesium Malate 1250 (141.7 Mg) MG Tabs 1 tab po qday or qday prn for HA   montelukast 10 MG tablet Commonly known as: Singulair Take 1 tablet (10 mg total) by mouth at bedtime.   NIFEdipine 60 MG 24 hr tablet Commonly known as: Procardia XL Take 1 tablet (60 mg total) by mouth daily. What changed:   medication strength  how much to take   pantoprazole 20 MG tablet Commonly known as: Protonix Take 1 tablet (20 mg total) by mouth 2 (two) times daily as needed.   Prenatal Vitamin and Mineral 28-0.8 MG Tabs Take 1 tablet by mouth daily.   promethazine 25 MG tablet Commonly known as: PHENERGAN Take 1 tablet (25 mg total) by mouth every 6 (six) hours as needed for nausea or vomiting.        Total discharge time: 20 minutes   Signed: Jaynie Collins M.D. 05/30/2020, 6:04 PM

## 2020-05-30 NOTE — Discharge Instructions (Signed)
Hypertension During Pregnancy High blood pressure (hypertension) is when the force of blood pumping through the arteries is high enough to cause problems with your health. Arteries are blood vessels that carry blood from the heart throughout the body. Hypertension during pregnancy can cause problems for you and your baby. It can be mild or severe. There are different types of hypertension that can happen during pregnancy. These include:  Chronic hypertension. This happens when you had high blood pressure before you became pregnant, and it continues during the pregnancy. Hypertension that develops before you are [redacted] weeks pregnant and continues during the pregnancy is also called chronic hypertension. If you have chronic hypertension, it will not go away after you have your baby. You will need follow-up visits with your health care provider after you have your baby. Your health care provider may want you to keep taking medicine for your blood pressure.  Gestational hypertension. This is hypertension that develops after the 20th week of pregnancy. Gestational hypertension usually goes away after you have your baby, but your health care provider will need to monitor your blood pressure to make sure that it is getting better.  Postpartum hypertension. This is high blood pressure that was present before delivery and continues after delivery or that starts after delivery. This usually occurs within 48 hours after childbirth but may occur up to 6 weeks after giving birth. When hypertension during pregnancy is severe, it is a medical emergency that requires treatment right away. How does this affect me? Women who have hypertension during pregnancy have a greater chance of developing hypertension later in life or during future pregnancies. In some cases, hypertension during pregnancy can cause serious complications, such as:  Stroke.  Heart attack.  Injury to other organs, such as kidneys, lungs, or  liver.  Preeclampsia.  A condition called hemolysis, elevated liver enzymes, and low platelet count (HELLP) syndrome.  Convulsions or seizures.  Placental abruption. How does this affect my baby? Hypertension during pregnancy can affect your baby. Your baby may:  Be born early (prematurely).  Not weigh as much as he or she should at birth (low birth weight).  Not tolerate labor well, leading to an unplanned cesarean delivery. This condition may also result in a baby's death before birth (stillbirth). What are the risks? There are certain factors that make it more likely for you to develop hypertension during pregnancy. These include:  Having hypertension during a previous pregnancy or a family history of hypertension.  Being overweight.  Being age 35 or older.  Being pregnant for the first time.  Being pregnant with more than one baby.  Becoming pregnant using fertilization methods, such as IVF (in vitro fertilization).  Having other medical problems, such as diabetes, kidney disease, or lupus. What can I do to lower my risk? The exact cause of hypertension during pregnancy is not known. You may be able to lower your risk by:  Maintaining a healthy weight.  Eating a healthy and balanced diet.  Following your health care provider's instructions about treating any long-term conditions that you had before becoming pregnant. It is very important to keep all of your prenatal care appointments. Your health care provider will check your blood pressure and make sure that your pregnancy is progressing as expected. If a problem is found, early treatment can prevent complications.   How is this treated? Treatment for hypertension during pregnancy varies depending on the type of hypertension you have and how serious it is.  If you were   taking medicine for high blood pressure before you became pregnant, talk with your health care provider. You may need to change medicine during  pregnancy because some medicines, like ACE inhibitors, may not be considered safe for your baby.  If you have gestational hypertension, your health care provider may order medicine to treat this during pregnancy.  If you are at risk for preeclampsia, your health care provider may recommend that you take a low-dose aspirin during your pregnancy.  If you have severe hypertension, you may need to be hospitalized so you and your baby can be monitored closely. You may also need to be given medicine to lower your blood pressure.  In some cases, if your condition gets worse, you may need to deliver your baby early. Follow these instructions at home: Eating and drinking  Drink enough fluid to keep your urine pale yellow.  Avoid caffeine.   Lifestyle  Do not use any products that contain nicotine or tobacco. These products include cigarettes, chewing tobacco, and vaping devices, such as e-cigarettes. If you need help quitting, ask your health care provider.  Do not use alcohol or drugs.  Avoid stress as much as possible.  Rest and get plenty of sleep.  Regular exercise can help to reduce your blood pressure. Ask your health care provider what kinds of exercise are best for you. General instructions  Take over-the-counter and prescription medicines only as told by your health care provider.  Keep all prenatal and follow-up visits. This is important. Contact a health care provider if:  You have symptoms that your health care provider told you may require more treatment or monitoring, such as: ? Headaches. ? Nausea or vomiting. ? Abdominal pain. ? Dizziness. ? Light-headedness. Get help right away if:  You have symptoms of serious complications, such as: ? Severe abdominal pain that does not get better with treatment. ? A severe headache that does not get better, blurred vision, or double vision. ? Vomiting that does not get better. ? Sudden, rapid weight gain or swelling in your  hands, ankles, or face. ? Vaginal bleeding. ? Blood in your urine. ? Shortness of breath or chest pain. ? Weakness on one side of your body or difficulty speaking.  Your baby is not moving as much as usual. These symptoms may represent a serious problem that is an emergency. Do not wait to see if the symptoms will go away. Get medical help right away. Call your local emergency services (911 in the [place] Do not drive yourself to the hospital. Summary  Hypertension during pregnancy can cause problems for you and your baby.  Treatment for hypertension during pregnancy varies depending on the type of hypertension you have and how serious it is.  Keep all prenatal and follow-up visits. This is important.  Get help right away if you have symptoms of serious complications related to high blood pressure. This information is not intended to replace advice given to you by your health care provider. Make sure you discuss any questions you have with your health care provider. Document Revised: 09/14/2019 Document Reviewed: 09/14/2019 Elsevier Patient Education  2021 [employer]  

## 2020-05-30 NOTE — Plan of Care (Signed)
  Problem: Education: Goal: Knowledge of General Education information will improve Description: Including pain rating scale, medication(s)/side effects and non-pharmacologic comfort measures Outcome: Completed/Met

## 2020-05-30 NOTE — Progress Notes (Signed)
FACULTY PRACTICE ANTEPARTUM(COMPREHENSIVE) NOTE  Carol Blair is a 30 y.o. [phone] with Estimated Date of Delivery: 08/10/20   By  LMP [redacted]w[redacted]d  who is admitted for uncontrolled HTN vs superimposed preeclampsia .    Fetal presentation is cephalic. Length of Stay:  1  Days  Date of admission:05/29/2020  Subjective: Pt reports that she does not have a headache today.  Denies blurry vision or RUQ pain. Patient reports the fetal movement as active. Patient reports uterine contraction  activity as none. Patient reports  vaginal bleeding as none. Patient describes fluid per vagina as None.  Vitals:  Blood pressure 137/73, pulse 76, temperature 98.1 F (36.7 C), temperature source Oral, resp. rate 16, last menstrual period 11/04/2019, SpO2 100 %, unknown if currently breastfeeding. Vitals:   05/29/20 1933 05/29/20 2053 05/29/20 2322 05/30/20 0354  BP: 134/66 (!) 153/90 (!) 141/79 137/73  Pulse: 67 84 70 76  Resp: 16  16 16   Temp: 98 F (36.7 C)  98.5 F (36.9 C) 98.1 F (36.7 C)  TempSrc: Oral  Oral Oral  SpO2: 100%  100% 100%   Physical Examination:  General appearance - alert, well appearing, and in no distress Mental status - normal mood, behavior, speech, dress, motor activity, and thought processes Chest - clear to auscultation, no wheezes, rales or rhonchi, symmetric air entry Heart - normal rate and regular rhythm Abdomen - gravid and non-tender Extremities - no pedal edema noted, no calf tenderness, no clonus Skin - warm and dry   Fetal Monitoring:  Baseline: 135 bpm, Variability: moderate, Accelerations: +accels, and Decelerations: Absent    Reactive NST  Labs:  Results for orders placed or performed during the hospital encounter of 05/29/20 (from the past 24 hour(s))  Urinalysis, Routine w reflex microscopic Urine, Clean Catch   Collection Time: 05/29/20  4:25 PM  Result Value Ref Range   Color, Urine YELLOW YELLOW   APPearance HAZY (A) CLEAR   Specific Gravity, Urine  1.016 1.005 - 1.030   pH 6.0 5.0 - 8.0   Glucose, UA NEGATIVE NEGATIVE mg/dL   Hgb urine dipstick NEGATIVE NEGATIVE   Bilirubin Urine NEGATIVE NEGATIVE   Ketones, ur 20 (A) NEGATIVE mg/dL   Protein, ur 30 (A) NEGATIVE mg/dL   Nitrite NEGATIVE NEGATIVE   Leukocytes,Ua NEGATIVE NEGATIVE   RBC / HPF 0-5 0 - 5 RBC/hpf   WBC, UA 0-5 0 - 5 WBC/hpf   Bacteria, UA FEW (A) NONE SEEN   Squamous Epithelial / LPF 0-5 0 - 5   Mucus PRESENT   Protein / creatinine ratio, urine   Collection Time: 05/29/20  4:25 PM  Result Value Ref Range   Creatinine, Urine 221.50 mg/dL   Total Protein, Urine 29 mg/dL   Protein Creatinine Ratio 0.13 0.00 - 0.15 mg/mg[Cre]  CBC   Collection Time: 05/29/20  4:49 PM  Result Value Ref Range   WBC 8.2 4.0 - 10.5 K/uL   RBC 3.69 (L) 3.87 - 5.11 MIL/uL   Hemoglobin 11.3 (L) 12.0 - 15.0 g/dL   HCT 05/31/20 (L) 43.5 - 68.6 %   MCV 91.9 80.0 - 100.0 fL   MCH 30.6 26.0 - 34.0 pg   MCHC 33.3 30.0 - 36.0 g/dL   RDW 16.8 37.2 - 90.2 %   Platelets 264 150 - 400 K/uL   nRBC 0.0 0.0 - 0.2 %  Comprehensive metabolic panel   Collection Time: 05/29/20  4:49 PM  Result Value Ref Range   Sodium 135 135 -  145 mmol/L   Potassium 3.5 3.5 - 5.1 mmol/L   Chloride 104 98 - 111 mmol/L   CO2 23 22 - 32 mmol/L   Glucose, Bld 143 (H) 70 - 99 mg/dL   BUN 5 (L) 6 - 20 mg/dL   Creatinine, Ser 1.61 0.44 - 1.00 mg/dL   Calcium 9.0 8.9 - 09.6 mg/dL   Total Protein 6.6 6.5 - 8.1 g/dL   Albumin 2.6 (L) 3.5 - 5.0 g/dL   AST 30 15 - 41 U/L   ALT 46 (H) 0 - 44 U/L   Alkaline Phosphatase 114 38 - 126 U/L   Total Bilirubin 0.6 0.3 - 1.2 mg/dL   GFR, Estimated >04 >54 mL/min   Anion gap 8 5 - 15  Resp Panel by RT-PCR (Flu A&B, Covid) Nasopharyngeal Swab   Collection Time: 05/29/20  6:15 PM   Specimen: Nasopharyngeal Swab; Nasopharyngeal(NP) swabs in vial transport medium  Result Value Ref Range   SARS Coronavirus 2 by RT PCR NEGATIVE NEGATIVE   Influenza A by PCR NEGATIVE NEGATIVE    Influenza B by PCR NEGATIVE NEGATIVE  Type and screen [facility]   Collection Time: 05/29/20  6:16 PM  Result Value Ref Range   ABO/RH(D) O POS    Antibody Screen NEG    Sample Expiration      06/01/2020,2359 Performed at [facility], [address], [place], [place] [address]   Comprehensive metabolic panel   Collection Time: 05/30/20  6:00 AM  Result Value Ref Range   Sodium 136 135 - 145 mmol/L   Potassium 3.7 3.5 - 5.1 mmol/L   Chloride 105 98 - 111 mmol/L   CO2 23 22 - 32 mmol/L   Glucose, Bld 101 (H) 70 - 99 mg/dL   BUN <5 (L) 6 - 20 mg/dL   Creatinine, Ser 9.14 0.44 - 1.00 mg/dL   Calcium 9.1 8.9 - 78.2 mg/dL   Total Protein 6.5 6.5 - 8.1 g/dL   Albumin 2.5 (L) 3.5 - 5.0 g/dL   AST 26 15 - 41 U/L   ALT 41 0 - 44 U/L   Alkaline Phosphatase 103 38 - 126 U/L   Total Bilirubin 0.5 0.3 - 1.2 mg/dL   GFR, Estimated >95 >62 mL/min   Anion gap 8 5 - 15  CBC   Collection Time: 05/30/20  6:00 AM  Result Value Ref Range   WBC 8.3 4.0 - 10.5 K/uL   RBC 3.55 (L) 3.87 - 5.11 MIL/uL   Hemoglobin 10.8 (L) 12.0 - 15.0 g/dL   HCT 13.0 (L) 86.5 - 78.4 %   MCV 92.4 80.0 - 100.0 fL   MCH 30.4 26.0 - 34.0 pg   MCHC 32.9 30.0 - 36.0 g/dL   RDW 69.6 29.5 - 28.4 %   Platelets 245 150 - 400 K/uL   nRBC 0.0 0.0 - 0.2 %    Imaging Studies:    No results found.   Medications:  Scheduled . aspirin EC  162 mg Oral Daily  . docusate sodium  100 mg Oral Daily  . enoxaparin (LOVENOX) injection  0.5 mg/kg Subcutaneous Q24H  . NIFEdipine  60 mg Oral Daily  . prenatal multivitamin  1 tablet Oral Q1200   I have reviewed the patient's current medications.  ASSESSMENT: [id number] [redacted]w[redacted]d Estimated Date of Delivery: 08/10/20  Admitted for cHTN vs superimposed preeclampsia  PLAN: 1) chronic HTN vs superimposed preeclampsia -labs stable as above, 24hr urine pending -headache has  imporved -BP doing better on Procardia XL 60mg  daily  2) Fetal well-being -NST q shift,  FWB ressuring- Cat. I -next scheduled on 6/2  3) Maternal care -tylenol prn -Lovenox for DVT prophylaxis  DISP: Pt strongly desires to be discharge home today- she does not want to have to stay in the hospital another night.  Would advise that she at least stay until 24hr urine complete and to ensure that BP remains stable.    8/2 Brewster Wolters 05/30/2020,7:41 AM

## 2020-05-31 ENCOUNTER — Other Ambulatory Visit: Payer: Self-pay | Admitting: Obstetrics and Gynecology

## 2020-05-31 DIAGNOSIS — R7989 Other specified abnormal findings of blood chemistry: Secondary | ICD-10-CM

## 2020-06-04 ENCOUNTER — Other Ambulatory Visit: Payer: Self-pay

## 2020-06-04 ENCOUNTER — Encounter (HOSPITAL_COMMUNITY): Payer: Self-pay | Admitting: Obstetrics and Gynecology

## 2020-06-04 ENCOUNTER — Inpatient Hospital Stay (HOSPITAL_COMMUNITY)
Admission: AD | Admit: 2020-06-04 | Discharge: 2020-06-04 | Disposition: A | Discharge status: Home / Self Care | Payer: Medicaid Other | Attending: Obstetrics and Gynecology | Admitting: Obstetrics and Gynecology

## 2020-06-04 DIAGNOSIS — O10013 Pre-existing essential hypertension complicating pregnancy, third trimester: Secondary | ICD-10-CM

## 2020-06-04 DIAGNOSIS — Z7982 Long term (current) use of aspirin: Secondary | ICD-10-CM | POA: Diagnosis not present

## 2020-06-04 DIAGNOSIS — Z3A30 30 weeks gestation of pregnancy: Secondary | ICD-10-CM | POA: Diagnosis not present

## 2020-06-04 DIAGNOSIS — O99213 Obesity complicating pregnancy, third trimester: Secondary | ICD-10-CM | POA: Diagnosis not present

## 2020-06-04 DIAGNOSIS — O10913 Unspecified pre-existing hypertension complicating pregnancy, third trimester: Secondary | ICD-10-CM | POA: Diagnosis not present

## 2020-06-04 DIAGNOSIS — O10919 Unspecified pre-existing hypertension complicating pregnancy, unspecified trimester: Secondary | ICD-10-CM

## 2020-06-04 LAB — CBC
HCT: 36 % (ref 36.0–46.0)
Hemoglobin: 11.9 g/dL — ABNORMAL LOW (ref 12.0–15.0)
MCH: 30.7 pg (ref 26.0–34.0)
MCHC: 33.1 g/dL (ref 30.0–36.0)
MCV: 92.8 fL (ref 80.0–100.0)
Platelets: 252 10*3/uL (ref 150–400)
RBC: 3.88 MIL/uL (ref 3.87–5.11)
RDW: 13.3 % (ref 11.5–15.5)
WBC: 7.3 10*3/uL (ref 4.0–10.5)
nRBC: 0 % (ref 0.0–0.2)

## 2020-06-04 LAB — PROTEIN / CREATININE RATIO, URINE
Creatinine, Urine: 309.66 mg/dL
Protein Creatinine Ratio: 0.08 mg/mg{Cre} (ref 0.00–0.15)
Total Protein, Urine: 26 mg/dL

## 2020-06-04 LAB — URINALYSIS, ROUTINE W REFLEX MICROSCOPIC
Bacteria, UA: NONE SEEN
Bilirubin Urine: NEGATIVE
Glucose, UA: NEGATIVE mg/dL
Hgb urine dipstick: NEGATIVE
Ketones, ur: NEGATIVE mg/dL
Leukocytes,Ua: NEGATIVE
Nitrite: NEGATIVE
Protein, ur: 30 mg/dL — AB
Specific Gravity, Urine: 1.024 (ref 1.005–1.030)
pH: 5 (ref 5.0–8.0)

## 2020-06-04 LAB — COMPREHENSIVE METABOLIC PANEL
ALT: 37 U/L (ref 0–44)
AST: 23 U/L (ref 15–41)
Albumin: 2.9 g/dL — ABNORMAL LOW (ref 3.5–5.0)
Alkaline Phosphatase: 114 U/L (ref 38–126)
Anion gap: 8 (ref 5–15)
BUN: <5 mg/dL — ABNORMAL LOW (ref 6–20)
CO2: 23 mmol/L (ref 22–32)
Calcium: 9.1 mg/dL (ref 8.9–10.3)
Chloride: 103 mmol/L (ref 98–111)
Creatinine, Ser: 0.64 mg/dL (ref 0.44–1.00)
GFR, Estimated: >60 mL/min (ref >60)
Glucose, Bld: 113 mg/dL — ABNORMAL HIGH (ref 70–99)
Potassium: 3.5 mmol/L (ref 3.5–5.1)
Sodium: 134 mmol/L — ABNORMAL LOW (ref 135–145)
Total Bilirubin: 0.3 mg/dL (ref 0.3–1.2)
Total Protein: 7.4 g/dL (ref 6.5–8.1)

## 2020-06-04 NOTE — Discharge Instructions (Signed)

## 2020-06-04 NOTE — MAU Note (Signed)
Presents for BP evaluation and H/A, states took BP at home and BP's 161/101and 157/99.  Took Procardia 60 mg @ 0730 for BP.  Endorses slight H/A after Fioricet @ 0200 this morning.  Denies visual disturbances or epigastric pain.  Denies VB or LOF.  Endorses +FM.

## 2020-06-04 NOTE — MAU Provider Note (Signed)
History     CSN: [id number]  Arrival date and time: 06/04/20 5701   Event Date/Time   First Provider Initiated Contact with Patient 06/04/20 [phone]      Chief Complaint  Patient presents with  . BP Evaluation   HPI  [name] is a 30 y.o. female [phone] @ [redacted]w[redacted]d with a history of CHTN, here in MAU with reported elevated BP at home. Reports severe range BP readings at home. She reports HA at 0200; she took 1 fioricet and the headache went away. She currently rates her pain 0/10. She does have a history of chronic headaches, and this HA felt similar to those in the past. She has no scotoma, + fetal movement.  She is taking procardia 60 mg XL daily. Her last dose was this morning. She is also taking baby ASA.    OB History    Gravida  5   Para  3   Term  3   Preterm      AB  1   Living  3     SAB  1   IAB  0   Ectopic  0   Multiple  0   Live Births  3           Past Medical History:  Diagnosis Date  . Benign essential hypertension antepartum 02/11/2011  . Obesity   . Vaginal Pap smear, abnormal     Past Surgical History:  Procedure Laterality Date  . LAPAROSCOPIC CHOLECYSTECTOMY W/ CHOLANGIOGRAPHY  06/10/10   Dr Gaynelle Adu    Family History  Problem Relation Age of Onset  . Hypertension Mother   . Hypertension Sister   . Kidney disease Sister   . Cancer Maternal Grandmother        GI  . Breast cancer Maternal Aunt   . Cancer Maternal Uncle        colon    Social History   Tobacco Use  . Smoking status: Never Smoker  . Smokeless tobacco: Never Used  Vaping Use  . Vaping Use: Never used  Substance Use Topics  . Alcohol use: No  . Drug use: No    Allergies:  Allergies  Allergen Reactions  . Codeine Nausea And Vomiting and Other (See Comments)    Shaking, sweating    Medications Prior to Admission  Medication Sig Dispense Refill Last Dose  . aspirin EC 81 MG tablet Take 1 tablet (81 mg total) by mouth daily. 60 tablet 1  06/04/2020 at Unknown time  . butalbital-acetaminophen-caffeine (FIORICET) 50-325-40 MG tablet Take 1 tablet by mouth every 4 (four) hours as needed for headache. 30 tablet 1 06/04/2020 at Unknown time  . NIFEdipine (PROCARDIA XL) 60 MG 24 hr tablet Take 1 tablet (60 mg total) by mouth daily. 30 tablet 2 06/04/2020 at Unknown time  . Prenatal Vit-Fe Fumarate-FA (PRENATAL VITAMIN AND MINERAL) 28-0.8 MG TABS Take 1 tablet by mouth daily. 60 tablet 2 06/03/2020 at Unknown time  . Magnesium Malate 1250 (141.7 Mg) MG TABS 1 tab po qday or qday prn for HA 30 tablet 3   . montelukast (SINGULAIR) 10 MG tablet Take 1 tablet (10 mg total) by mouth at bedtime. 30 tablet 2   . pantoprazole (PROTONIX) 20 MG tablet Take 1 tablet (20 mg total) by mouth 2 (two) times daily as needed. 60 tablet 1   . promethazine (PHENERGAN) 25 MG tablet Take 1 tablet (25 mg total) by mouth every 6 (six) hours as needed  for nausea or vomiting. 30 tablet 2 More than a month at Unknown time   Results for orders placed or performed during the hospital encounter of 06/04/20 (from the past 48 hour(s))  CBC     Status: Abnormal   Collection Time: 06/04/20  8:47 AM  Result Value Ref Range   WBC 7.3 4.0 - 10.5 K/uL   RBC 3.88 3.87 - 5.11 MIL/uL   Hemoglobin 11.9 (L) 12.0 - 15.0 g/dL   HCT 67.1 24.5 - 80.9 %   MCV 92.8 80.0 - 100.0 fL   MCH 30.7 26.0 - 34.0 pg   MCHC 33.1 30.0 - 36.0 g/dL   RDW 98.3 38.2 - 50.5 %   Platelets 252 150 - 400 K/uL   nRBC 0.0 0.0 - 0.2 %    Comment: Performed at [facility], [address] [place], [place] [address]  Comprehensive metabolic panel     Status: Abnormal   Collection Time: 06/04/20  8:47 AM  Result Value Ref Range   Sodium 134 (L) 135 - 145 mmol/L   Potassium 3.5 3.5 - 5.1 mmol/L   Chloride 103 98 - 111 mmol/L   CO2 23 22 - 32 mmol/L   Glucose, Bld 113 (H) 70 - 99 mg/dL    Comment: Glucose reference range applies only to samples taken after fasting for at least 8 hours.   BUN <5  (L) 6 - 20 mg/dL   Creatinine, Ser 3.41 0.44 - 1.00 mg/dL   Calcium 9.1 8.9 - 93.7 mg/dL   Total Protein 7.4 6.5 - 8.1 g/dL   Albumin 2.9 (L) 3.5 - 5.0 g/dL   AST 23 15 - 41 U/L   ALT 37 0 - 44 U/L   Alkaline Phosphatase 114 38 - 126 U/L   Total Bilirubin 0.3 0.3 - 1.2 mg/dL   GFR, Estimated >90 >24 mL/min    Comment: (NOTE) Calculated using the CKD-EPI Creatinine Equation (2021)    Anion gap 8 5 - 15    Comment: Performed at [facility], [address], [place], [place] [address]  Protein / creatinine ratio, urine     Status: None   Collection Time: 06/04/20  8:59 AM  Result Value Ref Range   Creatinine, Urine 309.66 mg/dL   Total Protein, Urine 26 mg/dL    Comment: NO NORMAL RANGE ESTABLISHED FOR THIS TEST   Protein Creatinine Ratio 0.08 0.00 - 0.15 mg/mg[Cre]    Comment: Performed at [facility], [address], [place], [place] [address]  Urinalysis, Routine w reflex microscopic Urine, Clean Catch     Status: Abnormal   Collection Time: 06/04/20  8:59 AM  Result Value Ref Range   Color, Urine AMBER (A) YELLOW    Comment: BIOCHEMICALS MAY BE AFFECTED BY COLOR   APPearance CLEAR CLEAR   Specific Gravity, Urine 1.024 1.005 - 1.030   pH 5.0 5.0 - 8.0   Glucose, UA NEGATIVE NEGATIVE mg/dL   Hgb urine dipstick NEGATIVE NEGATIVE   Bilirubin Urine NEGATIVE NEGATIVE   Ketones, ur NEGATIVE NEGATIVE mg/dL   Protein, ur 30 (A) NEGATIVE mg/dL   Nitrite NEGATIVE NEGATIVE   Leukocytes,Ua NEGATIVE NEGATIVE   RBC / HPF 0-5 0 - 5 RBC/hpf   WBC, UA 0-5 0 - 5 WBC/hpf   Bacteria, UA NONE SEEN NONE SEEN   Squamous Epithelial / LPF 0-5 0 - 5   Mucus PRESENT     Comment: Performed at [facility],  [address] [place], [place] [address]    Review of Systems  Eyes: Negative for photophobia.  Gastrointestinal: Negative for abdominal pain.  Neurological: Negative for headaches.   Physical Exam   Blood pressure 130/71, pulse 70, temperature 98 F (36.7 C),  temperature source Oral, resp. rate 20, height 5\' 4"  (1.626 m), weight 127.8 kg, last menstrual period 11/04/2019, SpO2 100 %, unknown if currently breastfeeding.   Patient Vitals for the past 24 hrs:  BP Temp Temp src Pulse Resp SpO2 Height Weight  06/04/20 1005 -- -- -- -- -- 100 % -- --  06/04/20 1001 130/69 -- -- 69 -- -- -- --  06/04/20 1000 -- -- -- -- -- 100 % -- --  06/04/20 0955 -- -- -- -- -- 100 % -- --  06/04/20 0950 -- -- -- -- -- 100 % -- --  06/04/20 0946 128/79 -- -- 69 -- -- -- --  06/04/20 0945 -- -- -- -- -- 100 % -- --  06/04/20 0940 -- -- -- -- -- 100 % -- --  06/04/20 0935 -- -- -- -- -- 100 % -- --  06/04/20 0934 130/71 -- -- 70 -- -- -- --  06/04/20 0930 134/74 -- -- 79 -- 99 % -- --  06/04/20 0925 -- -- -- -- -- 99 % -- --  06/04/20 0920 -- -- -- -- -- 99 % -- --  06/04/20 0915 -- -- -- -- -- 100 % -- --  06/04/20 0912 133/69 -- -- 82 -- -- -- --  06/04/20 0910 -- -- -- -- -- 100 % -- --  06/04/20 0854 (!) 141/90 98 F (36.7 C) Oral 93 20 98 % -- --  06/04/20 0846 -- -- -- -- -- -- 5\' 4"  (1.626 m) 127.8 kg    Physical Exam Constitutional:      General: She is not in acute distress.    Appearance: Normal appearance. She is not ill-appearing, toxic-appearing or diaphoretic.  HENT:     Head: Normocephalic.  Eyes:     Pupils: Pupils are equal, round, and reactive to light.  Abdominal:     Palpations: Abdomen is soft.  Skin:    General: Skin is warm.  Neurological:     Mental Status: She is alert and oriented to person, place, and time.     Deep Tendon Reflexes: Reflexes normal.     Comments: Negative clonus   Psychiatric:        Behavior: Behavior normal.   Fetal Tracing: Baseline: 120 bpm Variability: Moderate  Accelerations: 15x15 Decelerations: None Toco: None  MAU Course  Procedures  None  MDM  PIH labs collected BP's now WNL Headache 0/10 currently.   Assessment and Plan   A:  1. Chronic hypertension affecting pregnancy   2.  Maternal morbid obesity, antepartum (HCC)   3. [redacted] weeks gestation of pregnancy     P:  Discharge home in stable home Keep your appointment with MFM on June 2, bring your BP cuff with you. Return to MAU if symptoms worsen Pre E precautions.  Continue Procardia 60 mg daily Ok to use fioricet 2 tablets if needed.    I, NP 06/04/2020 11:02 AM

## 2020-06-06 ENCOUNTER — Ambulatory Visit: Payer: Medicaid Other | Admitting: *Deleted

## 2020-06-06 ENCOUNTER — Encounter: Payer: Self-pay | Admitting: *Deleted

## 2020-06-06 ENCOUNTER — Other Ambulatory Visit: Payer: Self-pay

## 2020-06-06 ENCOUNTER — Ambulatory Visit: Payer: Medicaid Other | Attending: Obstetrics and Gynecology

## 2020-06-06 DIAGNOSIS — O99210 Obesity complicating pregnancy, unspecified trimester: Secondary | ICD-10-CM | POA: Insufficient documentation

## 2020-06-06 DIAGNOSIS — O10913 Unspecified pre-existing hypertension complicating pregnancy, third trimester: Secondary | ICD-10-CM | POA: Diagnosis present

## 2020-06-06 DIAGNOSIS — O99213 Obesity complicating pregnancy, third trimester: Secondary | ICD-10-CM

## 2020-06-06 DIAGNOSIS — Z3A30 30 weeks gestation of pregnancy: Secondary | ICD-10-CM

## 2020-06-06 DIAGNOSIS — O10013 Pre-existing essential hypertension complicating pregnancy, third trimester: Secondary | ICD-10-CM | POA: Diagnosis not present

## 2020-06-06 DIAGNOSIS — Z362 Encounter for other antenatal screening follow-up: Secondary | ICD-10-CM | POA: Diagnosis not present

## 2020-06-07 ENCOUNTER — Other Ambulatory Visit: Payer: Self-pay | Admitting: *Deleted

## 2020-06-07 DIAGNOSIS — O10913 Unspecified pre-existing hypertension complicating pregnancy, third trimester: Secondary | ICD-10-CM

## 2020-06-10 ENCOUNTER — Other Ambulatory Visit: Payer: Self-pay

## 2020-06-10 ENCOUNTER — Ambulatory Visit (INDEPENDENT_AMBULATORY_CARE_PROVIDER_SITE_OTHER): Payer: Medicaid Other | Admitting: Obstetrics and Gynecology

## 2020-06-10 VITALS — BP 138/93 | HR 64 | Wt 282.3 lb

## 2020-06-10 DIAGNOSIS — R7989 Other specified abnormal findings of blood chemistry: Secondary | ICD-10-CM

## 2020-06-10 DIAGNOSIS — O0990 Supervision of high risk pregnancy, unspecified, unspecified trimester: Secondary | ICD-10-CM

## 2020-06-10 DIAGNOSIS — O10919 Unspecified pre-existing hypertension complicating pregnancy, unspecified trimester: Secondary | ICD-10-CM

## 2020-06-10 DIAGNOSIS — Z3A31 31 weeks gestation of pregnancy: Secondary | ICD-10-CM

## 2020-06-10 DIAGNOSIS — O361930 Maternal care for other isoimmunization, third trimester, not applicable or unspecified: Secondary | ICD-10-CM

## 2020-06-10 DIAGNOSIS — G44229 Chronic tension-type headache, not intractable: Secondary | ICD-10-CM

## 2020-06-10 DIAGNOSIS — O99210 Obesity complicating pregnancy, unspecified trimester: Secondary | ICD-10-CM

## 2020-06-10 DIAGNOSIS — Z6841 Body Mass Index (BMI) 40.0 and over, adult: Secondary | ICD-10-CM

## 2020-06-10 DIAGNOSIS — N879 Dysplasia of cervix uteri, unspecified: Secondary | ICD-10-CM

## 2020-06-10 NOTE — Progress Notes (Signed)
    PRENATAL VISIT NOTE  Subjective:  Carol Blair is a 30 y.o. [phone] at [redacted]w[redacted]d being seen today for ongoing prenatal care.  She is currently monitored for the following issues for this high-risk pregnancy and has Cervical dysplasia; Supervision of high risk pregnancy, antepartum; Chronic headaches; BMI 40.0-44.9, adult (HCC); Maternal morbid obesity, antepartum (HCC); History of severe pre-eclampsia; Low TSH level; Lewis isoimmunization during pregnancy; and Chronic hypertension affecting pregnancy on their problem list.  Patient reports no complaints.  Contractions: Irritability. Vag. Bleeding: None.  Movement: Present. Denies leaking of fluid.   The following portions of the patient's history were reviewed and updated as appropriate: allergies, current medications, past family history, past medical history, past social history, past surgical history and problem list.   Objective:   Vitals:   06/10/20 0842  BP: (!) 138/93  Pulse: 64  Weight: 282 lb 4.8 oz (128.1 kg)    Fetal Status: Fetal Heart Rate (bpm): 131   Movement: Present     General:  Alert, oriented and cooperative. Patient is in no acute distress.  Skin: Skin is warm and dry. No rash noted.   Cardiovascular: Normal heart rate noted  Respiratory: Normal respiratory effort, no problems with respiration noted  Abdomen: Soft, gravid, appropriate for gestational age.  Pain/Pressure: Absent     Pelvic: Cervical exam deferred        Extremities: Normal range of motion.  Edema: None  Mental Status: Normal mood and affect. Normal behavior. Normal judgment and thought content.   Assessment and Plan:  Pregnancy: [id number] at [redacted]w[redacted]d 1. Supervision of high risk pregnancy, antepartum Routine care. Vasectomy. D/w her re: IOL at 37-39wks based on BP control closer to that time frame  2. Low TSH level Not on meds currently - T4, free - TSH  3. Lewis isoimmunization during pregnancy in third trimester, single or unspecified  fetus Neg screen w/ 28wk labs  4. [redacted] weeks gestation of pregnancy  5. Chronic hypertension affecting pregnancy Doing well on procardia xl 60 q morning Starts qwk bpps next week 6/2: 34%, 1609gm, ac 45%, afi 12  6. Cervical dysplasia Repeat pp colpo  7. BMI 40.0-44.9, adult (HCC) Weight stable  8. Maternal morbid obesity, antepartum (HCC)  9. Chronic tension-type headache, not intractable No issues currently  Preterm labor symptoms and general obstetric precautions including but not limited to vaginal bleeding, contractions, leaking of fluid and fetal movement were reviewed in detail with the patient. Please refer to After Visit Summary for other counseling recommendations.   Return in about 10 days (around 06/20/2020) for in person, high risk ob, NST visit with diane, md visit.  Future Appointments  Date Time Provider Department Center  06/20/2020  2:30 PM [facility] NURSE [facility]  06/20/2020  2:45 PM [facility] US5 [facility]  06/27/2020  2:30 PM [facility] NURSE [facility]  06/27/2020  2:45 PM [facility] US5 [facility]  07/05/2020  2:00 PM [facility] NURSE [facility]  07/05/2020  2:15 PM [facility] US2 [facility]  07/11/2020  2:30 PM [facility] NURSE [facility]  07/11/2020  2:45 PM [facility] US5 [facility]    [place] Bing, MD

## 2020-06-11 ENCOUNTER — Encounter: Payer: Self-pay | Admitting: Obstetrics and Gynecology

## 2020-06-11 LAB — TSH: TSH: 0.645 u[IU]/mL (ref 0.450–4.500)

## 2020-06-11 LAB — T4, FREE: Free T4: 1.02 ng/dL (ref 0.82–1.77)

## 2020-06-14 ENCOUNTER — Encounter: Payer: Self-pay | Admitting: *Deleted

## 2020-06-20 ENCOUNTER — Ambulatory Visit: Payer: Medicaid Other

## 2020-06-21 ENCOUNTER — Other Ambulatory Visit: Payer: Self-pay

## 2020-06-21 ENCOUNTER — Ambulatory Visit (INDEPENDENT_AMBULATORY_CARE_PROVIDER_SITE_OTHER): Payer: Medicaid Other

## 2020-06-21 ENCOUNTER — Ambulatory Visit: Payer: Medicaid Other | Admitting: *Deleted

## 2020-06-21 DIAGNOSIS — O10919 Unspecified pre-existing hypertension complicating pregnancy, unspecified trimester: Secondary | ICD-10-CM | POA: Diagnosis not present

## 2020-06-21 DIAGNOSIS — O99210 Obesity complicating pregnancy, unspecified trimester: Secondary | ICD-10-CM

## 2020-06-21 DIAGNOSIS — Z3A32 32 weeks gestation of pregnancy: Secondary | ICD-10-CM | POA: Diagnosis not present

## 2020-06-26 ENCOUNTER — Ambulatory Visit: Payer: Medicaid Other | Admitting: General Practice

## 2020-06-26 ENCOUNTER — Other Ambulatory Visit: Payer: Self-pay

## 2020-06-26 ENCOUNTER — Ambulatory Visit (INDEPENDENT_AMBULATORY_CARE_PROVIDER_SITE_OTHER): Payer: Medicaid Other

## 2020-06-26 ENCOUNTER — Ambulatory Visit (INDEPENDENT_AMBULATORY_CARE_PROVIDER_SITE_OTHER): Payer: Medicaid Other | Admitting: Obstetrics and Gynecology

## 2020-06-26 ENCOUNTER — Other Ambulatory Visit: Payer: Medicaid Other

## 2020-06-26 VITALS — BP 139/88 | Wt 277.3 lb

## 2020-06-26 DIAGNOSIS — Z3A33 33 weeks gestation of pregnancy: Secondary | ICD-10-CM

## 2020-06-26 DIAGNOSIS — O0990 Supervision of high risk pregnancy, unspecified, unspecified trimester: Secondary | ICD-10-CM

## 2020-06-26 DIAGNOSIS — O10919 Unspecified pre-existing hypertension complicating pregnancy, unspecified trimester: Secondary | ICD-10-CM

## 2020-06-26 DIAGNOSIS — O99210 Obesity complicating pregnancy, unspecified trimester: Secondary | ICD-10-CM

## 2020-06-26 LAB — POCT URINALYSIS DIP (DEVICE)
Glucose, UA: NEGATIVE mg/dL
Hgb urine dipstick: NEGATIVE
Ketones, ur: NEGATIVE mg/dL
Leukocytes,Ua: NEGATIVE
Nitrite: NEGATIVE
Protein, ur: 30 mg/dL — AB
Specific Gravity, Urine: 1.025 (ref 1.005–1.030)
Urobilinogen, UA: 2 mg/dL — ABNORMAL HIGH (ref 0.0–1.0)
pH: 6 (ref 5.0–8.0)

## 2020-06-26 MED ORDER — BLOOD PRESSURE KIT DEVI: 1.0000 | Freq: Once | 0 refills | Status: AC

## 2020-06-26 NOTE — Progress Notes (Signed)
   PRENATAL VISIT NOTE  Subjective:  Carol Blair is a 30 y.o. [phone] at 33w4dbeing seen today for ongoing prenatal care.  She is currently monitored for the following issues for this high-risk pregnancy and has Cervical dysplasia; Supervision of high risk pregnancy, antepartum; Chronic headaches; BMI 40.0-44.9, adult ([place]; Maternal morbid obesity, antepartum ([place]; History of severe pre-eclampsia; Lewis isoimmunization during pregnancy; and Chronic hypertension affecting pregnancy on their problem list.  Patient reports  occasional HAs .  Contractions: Irritability. Vag. Bleeding: None.  Movement: Present. Denies leaking of fluid.   The following portions of the patient's history were reviewed and updated as appropriate: allergies, current medications, past family history, past medical history, past social history, past surgical history and problem list.   Objective:   Vitals:   06/26/20 1032  BP: 139/88  Weight: 277 lb 4.8 oz (125.8 kg)    Fetal Status: Fetal Heart Rate (bpm): 128   Movement: Present     General:  Alert, oriented and cooperative. Patient is in no acute distress.  Skin: Skin is warm and dry. No rash noted.   Cardiovascular: Normal heart rate noted  Respiratory: Normal respiratory effort, no problems with respiration noted  Abdomen: Soft, gravid, appropriate for gestational age.  Pain/Pressure: Absent     Pelvic: Cervical exam deferred        Extremities: Normal range of motion.  Edema: None  Mental Status: Normal mood and affect. Normal behavior. Normal judgment and thought content.   Assessment and Plan:  Pregnancy: [id number] [redacted]w[redacted]d. Supervision of high risk pregnancy, antepartum Routine care. Vasectomy. D/w her re: IOL at 37-39wks based on BP control closer to that time frame - Blood Pressure Monitoring (BLOOD PRESSURE KIT) DEVI; 1 Device by Does not apply route once for 1 dose.  Dispense: 1 each; Refill: 0   2. Low TSH level Not on meds currently.  Normal tsh and ft4 on 6/6.    3. Lewis isoimmunization during pregnancy in third trimester, single or unspecified fetus Neg screen w/ 28wk labs   4. [redacted] weeks gestation of pregnancy   5. Chronic hypertension affecting pregnancy Doing well on procardia xl 60 q morning Starts qwk bpps later today. Has rpt growth next week 6/2: 34%, 1609gm, ac 45%, afi 12   6. Cervical dysplasia Repeat pp colpo   7. BMI 40.0-44.9, adult (HCC) Weight stable   8. Maternal morbid obesity, antepartum ([place]  9. Chronic tension-type headache, not intractable No issues currently. Hasn't picked up the Mg yet. I d/w her to do that qday and use fioricet sparingly like 3x/wk at the most and to wean herself off of it to that amount  Preterm labor symptoms and general obstetric precautions including but not limited to vaginal bleeding, contractions, leaking of fluid and fetal movement were reviewed in detail with the patient. Please refer to After Visit Summary for other counseling recommendations.   Return in about 9 days (around 07/05/2020) for in person, md visit, high risk ob.  Future Appointments  Date Time Provider [place]6/22/2022 11:15 [facility]7/01/2020  2:00 PM [facility]  2:15 PM [facility] US2 [facility]/07/2020  1:15 PM [facility] NST [facility]  ChAletha HalimMD

## 2020-06-26 NOTE — Progress Notes (Signed)
Pt informed that the ultrasound is considered a limited OB ultrasound and is not intended to be a complete ultrasound exam.  Patient also informed that the ultrasound is not being completed with the intent of assessing for fetal or placental anomalies or any pelvic abnormalities.  Explained that the purpose of today's ultrasound is to assess for  BPP, presentation, and AFI.  Patient acknowledges the purpose of the exam and the limitations of the study.     Aleera Gilcrease H RN BSN 06/26/20  

## 2020-06-27 ENCOUNTER — Ambulatory Visit: Payer: Medicaid Other

## 2020-07-04 ENCOUNTER — Other Ambulatory Visit: Payer: Medicaid Other

## 2020-07-05 ENCOUNTER — Other Ambulatory Visit: Payer: Self-pay

## 2020-07-05 ENCOUNTER — Encounter: Payer: Self-pay | Admitting: *Deleted

## 2020-07-05 ENCOUNTER — Ambulatory Visit (INDEPENDENT_AMBULATORY_CARE_PROVIDER_SITE_OTHER): Payer: Medicaid Other | Admitting: Family Medicine

## 2020-07-05 ENCOUNTER — Ambulatory Visit: Payer: Medicaid Other | Attending: Obstetrics

## 2020-07-05 ENCOUNTER — Ambulatory Visit: Payer: Medicaid Other | Admitting: *Deleted

## 2020-07-05 ENCOUNTER — Other Ambulatory Visit (HOSPITAL_COMMUNITY)
Admission: RE | Admit: 2020-07-05 | Discharge: 2020-07-05 | Disposition: A | Discharge status: Home / Self Care | Payer: Medicaid Other | Source: Ambulatory Visit | Attending: Family Medicine | Admitting: Family Medicine

## 2020-07-05 ENCOUNTER — Encounter: Payer: Self-pay | Admitting: Family Medicine

## 2020-07-05 VITALS — BP 142/96 | HR 89 | Wt 276.7 lb

## 2020-07-05 DIAGNOSIS — Z3A34 34 weeks gestation of pregnancy: Secondary | ICD-10-CM | POA: Diagnosis not present

## 2020-07-05 DIAGNOSIS — O10013 Pre-existing essential hypertension complicating pregnancy, third trimester: Secondary | ICD-10-CM

## 2020-07-05 DIAGNOSIS — O10913 Unspecified pre-existing hypertension complicating pregnancy, third trimester: Secondary | ICD-10-CM | POA: Insufficient documentation

## 2020-07-05 DIAGNOSIS — Z8759 Personal history of other complications of pregnancy, childbirth and the puerperium: Secondary | ICD-10-CM

## 2020-07-05 DIAGNOSIS — O10919 Unspecified pre-existing hypertension complicating pregnancy, unspecified trimester: Secondary | ICD-10-CM

## 2020-07-05 DIAGNOSIS — O361920 Maternal care for other isoimmunization, second trimester, not applicable or unspecified: Secondary | ICD-10-CM

## 2020-07-05 DIAGNOSIS — N879 Dysplasia of cervix uteri, unspecified: Secondary | ICD-10-CM

## 2020-07-05 DIAGNOSIS — Z362 Encounter for other antenatal screening follow-up: Secondary | ICD-10-CM | POA: Diagnosis not present

## 2020-07-05 DIAGNOSIS — O99213 Obesity complicating pregnancy, third trimester: Secondary | ICD-10-CM | POA: Diagnosis not present

## 2020-07-05 DIAGNOSIS — O99210 Obesity complicating pregnancy, unspecified trimester: Secondary | ICD-10-CM | POA: Insufficient documentation

## 2020-07-05 DIAGNOSIS — O0990 Supervision of high risk pregnancy, unspecified, unspecified trimester: Secondary | ICD-10-CM | POA: Insufficient documentation

## 2020-07-05 MED ORDER — NIFEDIPINE ER 90 MG PO TB24: 90.0000 mg | ORAL_TABLET | Freq: Every day | ORAL | 5 refills | Status: DC

## 2020-07-05 NOTE — Progress Notes (Signed)
Patient was assessed and managed by nursing staff during this encounter. I have reviewed the chart and agree with the documentation and plan. I have also made any necessary editorial changes.  RN will contact IT as to why the images she sent for me to review are not showing up in the computer for me to view. Per her report, BPP was 10/10  [place] Bing, MD 07/05/2020 10:19 AM

## 2020-07-05 NOTE — Progress Notes (Signed)
   Subjective:  Carol Blair is a 30 y.o. [phone] at [redacted]w[redacted]d being seen today for ongoing prenatal care.  She is currently monitored for the following issues for this high-risk pregnancy and has Cervical dysplasia; Supervision of high risk pregnancy, antepartum; Chronic headaches; BMI 40.0-44.9, adult (HCC); Maternal morbid obesity, antepartum (HCC); History of severe pre-eclampsia; Lewis isoimmunization during pregnancy; and Chronic hypertension affecting pregnancy on their problem list.  Patient reports no complaints.  Contractions: Irritability. Vag. Bleeding: None.  Movement: Present. Denies leaking of fluid.   The following portions of the patient's history were reviewed and updated as appropriate: allergies, current medications, past family history, past medical history, past social history, past surgical history and problem list. Problem list updated.  Objective:   Vitals:   07/05/20 1042  BP: (!) 142/96  Pulse: 89  Weight: 276 lb 11.2 oz (125.5 kg)    Fetal Status: Fetal Heart Rate (bpm): 132   Movement: Present     General:  Alert, oriented and cooperative. Patient is in no acute distress.  Skin: Skin is warm and dry. No rash noted.   Cardiovascular: Normal heart rate noted  Respiratory: Normal respiratory effort, no problems with respiration noted  Abdomen: Soft, gravid, appropriate for gestational age. Pain/Pressure: Present     Pelvic: Vag. Bleeding: None     Cervical exam deferred        Extremities: Normal range of motion.  Edema: None  Mental Status: Normal mood and affect. Normal behavior. Normal judgment and thought content.   Urinalysis:      Assessment and Plan:  Pregnancy: [id number] at [redacted]w[redacted]d  1. Supervision of high risk pregnancy, antepartum BP mildly elevated, see below FHR normal GBS swabs today given IOL at 38 wks  2. Chronic hypertension affecting pregnancy Mild range today, will increase procardia to 90mg  daily Following w MFM for regular growths and  BPP in our clinic, last done 06/26/20 and was reportedly 10/10, has another scheduled today Given increasing meds will plan on IOL at 38wks, possible 37wks pending clinical course IOL form completed, orders placed  3. Cervical dysplasia HSIL/+HPV pap 01/2020, needs post partum colpo  4. Maternal morbid obesity, antepartum (HCC)   5. Lewis isoimmunization during pregnancy in second trimester, single or unspecified fetus Not clinically significant  6. History of severe pre-eclampsia On ASA  Preterm labor symptoms and general obstetric precautions including but not limited to vaginal bleeding, contractions, leaking of fluid and fetal movement were reviewed in detail with the patient. Please refer to After Visit Summary for other counseling recommendations.  Return in 2 weeks (on 07/19/2020).   07/21/2020, MD

## 2020-07-05 NOTE — Patient Instructions (Signed)

## 2020-07-09 LAB — GC/CHLAMYDIA PROBE AMP (~~LOC~~) NOT AT ARMC
Chlamydia: NEGATIVE
Comment: NEGATIVE
Comment: NORMAL
Neisseria Gonorrhea: NEGATIVE

## 2020-07-09 LAB — CULTURE, BETA STREP (GROUP B ONLY): Strep Gp B Culture: NEGATIVE

## 2020-07-11 ENCOUNTER — Ambulatory Visit: Payer: Medicaid Other

## 2020-07-11 ENCOUNTER — Ambulatory Visit (INDEPENDENT_AMBULATORY_CARE_PROVIDER_SITE_OTHER): Payer: Medicaid Other | Admitting: Obstetrics and Gynecology

## 2020-07-11 ENCOUNTER — Encounter: Payer: Self-pay | Admitting: *Deleted

## 2020-07-11 ENCOUNTER — Ambulatory Visit: Payer: Medicaid Other | Admitting: *Deleted

## 2020-07-11 ENCOUNTER — Ambulatory Visit (INDEPENDENT_AMBULATORY_CARE_PROVIDER_SITE_OTHER): Payer: Medicaid Other

## 2020-07-11 ENCOUNTER — Other Ambulatory Visit: Payer: Self-pay

## 2020-07-11 VITALS — BP 144/91 | HR 86 | Wt 276.1 lb

## 2020-07-11 DIAGNOSIS — O0990 Supervision of high risk pregnancy, unspecified, unspecified trimester: Secondary | ICD-10-CM

## 2020-07-11 DIAGNOSIS — O10919 Unspecified pre-existing hypertension complicating pregnancy, unspecified trimester: Secondary | ICD-10-CM | POA: Diagnosis not present

## 2020-07-11 DIAGNOSIS — Z6841 Body Mass Index (BMI) 40.0 and over, adult: Secondary | ICD-10-CM

## 2020-07-11 DIAGNOSIS — Z8759 Personal history of other complications of pregnancy, childbirth and the puerperium: Secondary | ICD-10-CM

## 2020-07-11 DIAGNOSIS — O361930 Maternal care for other isoimmunization, third trimester, not applicable or unspecified: Secondary | ICD-10-CM

## 2020-07-11 DIAGNOSIS — O99210 Obesity complicating pregnancy, unspecified trimester: Secondary | ICD-10-CM

## 2020-07-11 DIAGNOSIS — Z3A35 35 weeks gestation of pregnancy: Secondary | ICD-10-CM

## 2020-07-11 NOTE — Progress Notes (Signed)
   PRENATAL VISIT NOTE  Subjective:  Carol Blair is a 30 y.o. [phone] at [redacted]w[redacted]d being seen today for ongoing prenatal care.  She is currently monitored for the following issues for this high-risk pregnancy and has Cervical dysplasia; Supervision of high risk pregnancy, antepartum; Chronic headaches; BMI 40.0-44.9, adult (HCC); Maternal morbid obesity, antepartum (HCC); History of severe pre-eclampsia; Lewis isoimmunization during pregnancy; Chronic hypertension affecting pregnancy; and [redacted] weeks gestation of pregnancy on their problem list.  Patient doing well with no acute concerns today. She reports headache. Mild.   Contractions: Not present. Vag. Bleeding: None.  Movement: Present. Denies leaking of fluid.   The following portions of the patient's history were reviewed and updated as appropriate: allergies, current medications, past family history, past medical history, past social history, past surgical history and problem list. Problem list updated.  Objective:   Vitals:   07/11/20 1411  BP: (!) 144/91  Pulse: 86  Weight: 276 lb 1.6 oz (125.2 kg)    Fetal Status: Fetal Heart Rate (bpm): RNST   Movement: Present     General:  Alert, oriented and cooperative. Patient is in no acute distress.  Skin: Skin is warm and dry. No rash noted.   Cardiovascular: Normal heart rate noted  Respiratory: Normal respiratory effort, no problems with respiration noted  Abdomen: Soft, gravid, appropriate for gestational age.  Pain/Pressure: Present     Pelvic: Cervical exam deferred        Extremities: Normal range of motion.     Mental Status:  Normal mood and affect. Normal behavior. Normal judgment and thought content.   Assessment and Plan:  Pregnancy: [id number] at [redacted]w[redacted]d  1. Supervision of high risk pregnancy, antepartum Continue routine care and weekly testing.  Per previous note IOL at 38 weeks Pt states AFI was 7 today, if pt has oligo on future testing, will need earlier IOL  2. Chronic  hypertension affecting pregnancy Pt increased procardia to 90 mg daily, pet states she vomited a few hours after today's dose and wasn't sure it stayed down.  3. Obesity in pregnancy   4. [redacted] weeks gestation of pregnancy   5. Maternal morbid obesity, antepartum (HCC)   6. Lewis isoimmunization during pregnancy in third trimester, single or unspecified fetus Not medically significant  7. History of severe pre-eclampsia Pt again given preeclampsia criteria and when to seek eval at MAU  8. BMI 40.0-44.9, adult (HCC)   Preterm labor symptoms and general obstetric precautions including but not limited to vaginal bleeding, contractions, leaking of fluid and fetal movement were reviewed in detail with the patient.  Please refer to After Visit Summary for other counseling recommendations.   Return in about 1 week (around 07/18/2020) for [facility] and NST/BPP on 7/14 and 7/21, HOB, in person.   Mariel Aloe, MD Faculty Attending Center for [facility]

## 2020-07-11 NOTE — Progress Notes (Signed)
Pt states she still has "nagging"  H/A daily - pain scale = 5.

## 2020-07-12 ENCOUNTER — Telehealth: Payer: Self-pay | Admitting: *Deleted

## 2020-07-12 NOTE — Telephone Encounter (Signed)
Spoke with patient via phone.  Appointments given.  Patient states understanding and agreement. 

## 2020-07-14 ENCOUNTER — Other Ambulatory Visit: Payer: Self-pay | Admitting: Advanced Practice Midwife

## 2020-07-15 ENCOUNTER — Ambulatory Visit (INDEPENDENT_AMBULATORY_CARE_PROVIDER_SITE_OTHER): Payer: Medicaid Other

## 2020-07-15 ENCOUNTER — Other Ambulatory Visit: Payer: Self-pay

## 2020-07-15 ENCOUNTER — Ambulatory Visit (INDEPENDENT_AMBULATORY_CARE_PROVIDER_SITE_OTHER): Payer: Medicaid Other | Admitting: Obstetrics and Gynecology

## 2020-07-15 ENCOUNTER — Ambulatory Visit: Payer: Medicaid Other | Admitting: *Deleted

## 2020-07-15 VITALS — BP 148/76 | HR 95 | Wt 277.2 lb

## 2020-07-15 DIAGNOSIS — R519 Headache, unspecified: Secondary | ICD-10-CM

## 2020-07-15 DIAGNOSIS — Z3A36 36 weeks gestation of pregnancy: Secondary | ICD-10-CM

## 2020-07-15 DIAGNOSIS — N879 Dysplasia of cervix uteri, unspecified: Secondary | ICD-10-CM

## 2020-07-15 DIAGNOSIS — O10919 Unspecified pre-existing hypertension complicating pregnancy, unspecified trimester: Secondary | ICD-10-CM

## 2020-07-15 DIAGNOSIS — O361910 Maternal care for other isoimmunization, first trimester, not applicable or unspecified: Secondary | ICD-10-CM

## 2020-07-15 DIAGNOSIS — O99210 Obesity complicating pregnancy, unspecified trimester: Secondary | ICD-10-CM

## 2020-07-15 DIAGNOSIS — G8929 Other chronic pain: Secondary | ICD-10-CM

## 2020-07-15 DIAGNOSIS — O0990 Supervision of high risk pregnancy, unspecified, unspecified trimester: Secondary | ICD-10-CM

## 2020-07-15 DIAGNOSIS — Z8759 Personal history of other complications of pregnancy, childbirth and the puerperium: Secondary | ICD-10-CM

## 2020-07-15 DIAGNOSIS — Z6841 Body Mass Index (BMI) 40.0 and over, adult: Secondary | ICD-10-CM

## 2020-07-15 NOTE — Progress Notes (Signed)
     PRENATAL VISIT NOTE  Subjective:  Carol Blair is a 30 y.o. [phone] at [redacted]w[redacted]d being seen today for ongoing prenatal care.  She is currently monitored for the following issues for this high-risk pregnancy and has Cervical dysplasia; Supervision of high risk pregnancy, antepartum; Chronic headaches; BMI 40.0-44.9, adult (HCC); Maternal morbid obesity, antepartum (HCC); History of severe pre-eclampsia; Lewis isoimmunization during pregnancy; and Chronic hypertension affecting pregnancy on their problem list.  Patient reports no complaints.  Contractions: Not present. Vag. Bleeding: None.  Movement: Present. Denies leaking of fluid.   The following portions of the patient's history were reviewed and updated as appropriate: allergies, current medications, past family history, past medical history, past social history, past surgical history and problem list.   Objective:   Vitals:   07/15/20 1012  BP: (!) 148/76  Pulse: 95  Weight: 277 lb 3.2 oz (125.7 kg)    Fetal Status: Fetal Heart Rate (bpm): 138   Movement: Present     General:  Alert, oriented and cooperative. Patient is in no acute distress.  Skin: Skin is warm and dry. No rash noted.   Cardiovascular: Normal heart rate noted  Respiratory: Normal respiratory effort, no problems with respiration noted  Abdomen: Soft, gravid, appropriate for gestational age.  Pain/Pressure: Absent     Pelvic: Cervical exam deferred        Extremities: Normal range of motion.  Edema: None  Mental Status: Normal mood and affect. Normal behavior. Normal judgment and thought content.   Assessment and Plan:  Pregnancy: G5P3013 at [redacted]w[redacted]d 1. [redacted] weeks gestation of pregnancy  2. Chronic hypertension affecting pregnancy On procardia xl 90 qday which she took this morning; pt denies any pre-eclampsia s/s. Leave at 90 qday for now. If goes higher, consider doing 30mg  at night. Has bpp later today. Growth on 7/1: 29%, 2383gm, ac 28%  Patient has 38wk  IOL already set up  3. Cervical dysplasia Needs repeat colpo PP  4. Supervision of high risk pregnancy, antepartum GBS neg  5. Maternal morbid obesity, antepartum (HCC)  6. BMI 40.0-44.9, adult (HCC)  7. Lewis isoimmunization during pregnancy in first trimester, single or unspecified fetus F/u L&D admit t&s  8. History of severe pre-eclampsia Continue low dose asa  9. Chronic nonintractable headache, unspecified headache type No issues  Preterm labor symptoms and general obstetric precautions including but not limited to vaginal bleeding, contractions, leaking of fluid and fetal movement were reviewed in detail with the patient. Please refer to After Visit Summary for other counseling recommendations.   Return in about 1 week (around 07/22/2020) for in person, high risk ob, nst/bpp with diane, md visit.  Future Appointments  Date Time Provider Department Center  07/22/2020 10:15 AM [facility]  07/22/2020 10:55 AM 07/24/2020, MD [facility]  07/27/2020  6:30 AM [facility] SCHED ROOM [facility] None    07/29/2020, MD

## 2020-07-18 ENCOUNTER — Encounter (HOSPITAL_COMMUNITY): Payer: Self-pay | Admitting: Obstetrics & Gynecology

## 2020-07-18 ENCOUNTER — Other Ambulatory Visit: Payer: Self-pay

## 2020-07-18 ENCOUNTER — Inpatient Hospital Stay (HOSPITAL_COMMUNITY)
Admission: AD | Admit: 2020-07-18 | Discharge: 2020-07-18 | Disposition: A | Discharge status: Home / Self Care | Payer: Medicaid Other | Attending: Obstetrics & Gynecology | Admitting: Obstetrics & Gynecology

## 2020-07-18 DIAGNOSIS — O368130 Decreased fetal movements, third trimester, not applicable or unspecified: Secondary | ICD-10-CM | POA: Insufficient documentation

## 2020-07-18 DIAGNOSIS — Z3A36 36 weeks gestation of pregnancy: Secondary | ICD-10-CM | POA: Diagnosis not present

## 2020-07-18 DIAGNOSIS — O368190 Decreased fetal movements, unspecified trimester, not applicable or unspecified: Secondary | ICD-10-CM

## 2020-07-18 DIAGNOSIS — O10919 Unspecified pre-existing hypertension complicating pregnancy, unspecified trimester: Secondary | ICD-10-CM

## 2020-07-18 DIAGNOSIS — Z3689 Encounter for other specified antenatal screening: Secondary | ICD-10-CM

## 2020-07-18 DIAGNOSIS — O10913 Unspecified pre-existing hypertension complicating pregnancy, third trimester: Secondary | ICD-10-CM | POA: Diagnosis not present

## 2020-07-18 DIAGNOSIS — Z3493 Encounter for supervision of normal pregnancy, unspecified, third trimester: Secondary | ICD-10-CM

## 2020-07-18 LAB — CBC
HCT: 34.8 % — ABNORMAL LOW (ref 36.0–46.0)
Hemoglobin: 11.3 g/dL — ABNORMAL LOW (ref 12.0–15.0)
MCH: 30.1 pg (ref 26.0–34.0)
MCHC: 32.5 g/dL (ref 30.0–36.0)
MCV: 92.8 fL (ref 80.0–100.0)
Platelets: 238 10*3/uL (ref 150–400)
RBC: 3.75 MIL/uL — ABNORMAL LOW (ref 3.87–5.11)
RDW: 14.2 % (ref 11.5–15.5)
WBC: 9.4 10*3/uL (ref 4.0–10.5)
nRBC: 0 % (ref 0.0–0.2)

## 2020-07-18 LAB — COMPREHENSIVE METABOLIC PANEL
ALT: 17 U/L (ref 0–44)
AST: 16 U/L (ref 15–41)
Albumin: 2.6 g/dL — ABNORMAL LOW (ref 3.5–5.0)
Alkaline Phosphatase: 143 U/L — ABNORMAL HIGH (ref 38–126)
Anion gap: 8 (ref 5–15)
BUN: <5 mg/dL — ABNORMAL LOW (ref 6–20)
CO2: 21 mmol/L — ABNORMAL LOW (ref 22–32)
Calcium: 9.1 mg/dL (ref 8.9–10.3)
Chloride: 106 mmol/L (ref 98–111)
Creatinine, Ser: 0.72 mg/dL (ref 0.44–1.00)
GFR, Estimated: >60 mL/min (ref >60)
Glucose, Bld: 86 mg/dL (ref 70–99)
Potassium: 3.7 mmol/L (ref 3.5–5.1)
Sodium: 135 mmol/L (ref 135–145)
Total Bilirubin: 0.3 mg/dL (ref 0.3–1.2)
Total Protein: 6.8 g/dL (ref 6.5–8.1)

## 2020-07-18 LAB — URINALYSIS, ROUTINE W REFLEX MICROSCOPIC
Bilirubin Urine: NEGATIVE
Glucose, UA: NEGATIVE mg/dL
Hgb urine dipstick: NEGATIVE
Ketones, ur: NEGATIVE mg/dL
Leukocytes,Ua: NEGATIVE
Nitrite: NEGATIVE
Protein, ur: NEGATIVE mg/dL
Specific Gravity, Urine: <1.005 — ABNORMAL LOW (ref 1.005–1.030)
pH: 6 (ref 5.0–8.0)

## 2020-07-18 LAB — PROTEIN / CREATININE RATIO, URINE
Creatinine, Urine: 108.33 mg/dL
Protein Creatinine Ratio: 0.18 mg/mg{Cre} — ABNORMAL HIGH (ref 0.00–0.15)
Total Protein, Urine: 20 mg/dL

## 2020-07-18 NOTE — MAU Provider Note (Signed)
Chief Complaint:  Headache, Dizziness, Chest Pain, and Decreased Fetal Movement   Event Date/Time   First Provider Initiated Contact with Patient 07/18/20 1457     HPI: Carol Blair is a 30 y.o. [id number] at [redacted]w[redacted]d who presents to maternity admissions reporting decreased fetal movement and overall not feeling well. She had a severe headache last night (it is much decreased today) with some lightheadedness. Admits to not drinking much or eating as much due to nausea. Mostly concerned about her BP and decreased fetal movement. Denies vaginal bleeding, leaking of fluid, fever, falls, or recent illness.   Pregnancy Course: Receives care at [facility]. Has cHTN being treated by 90mg  daily procardia XL which she did take today  Past Medical History:  Diagnosis Date   Benign essential hypertension antepartum 02/11/2011   Low TSH level 02/01/2020   [ ]  needs f/u ft4  [x]  f/u ft4: neg [x]  low tsh with 28wk labs   Obesity    Vaginal Pap smear, abnormal    OB History  Gravida Para Term Preterm AB Living  5 3 3   1 3   SAB IAB Ectopic Multiple Live Births  1 0 0 0 3    # Outcome Date GA Lbr Len/2nd Weight Sex Delivery Anes PTL Lv  5 Current           4 SAB 2021          3 Term 12/29/15 [redacted]w[redacted]d   F Vag-Spont None  LIV  2 Term 09/16/11 [redacted]w[redacted]d 02:51 / 00:05 6 lb 1.4 oz (2.761 kg) M Vag-Spont None  LIV     Birth Comments: wnl  1 Term 04/16/10 107w0d  6 lb 1.9 oz (2.776 kg) F Vag-Spont EPI  LIV     Birth Comments: System Generated. Please review and update pregnancy details.   Past Surgical History:  Procedure Laterality Date   LAPAROSCOPIC CHOLECYSTECTOMY W/ CHOLANGIOGRAPHY  06/10/10   Dr 11/16/11   Family History  Problem Relation Age of Onset   Hypertension Mother    Hypertension Sister    Kidney disease Sister    Cancer Maternal Grandmother        GI   Breast cancer Maternal Aunt    Cancer Maternal Uncle        colon   Social History   Tobacco Use   Smoking status: Never   Smokeless  tobacco: Never  Vaping Use   Vaping Use: Never used  Substance Use Topics   Alcohol use: No   Drug use: No   Allergies  Allergen Reactions   Codeine Nausea And Vomiting and Other (See Comments)    Shaking, sweating   No medications prior to admission.    I have reviewed patient's Past Medical Hx, Surgical Hx, Family Hx, Social Hx, medications and allergies.   ROS:  Pertinent items noted in HPI and remainder of comprehensive ROS otherwise negative.  Physical Exam  Patient Vitals for the past 24 hrs:  BP Temp Temp src Pulse Resp SpO2 Height Weight  07/18/20 1631 132/74 -- -- 71 15 -- -- --  07/18/20 1630 -- -- -- -- -- 99 % -- --  07/18/20 1616 127/75 -- -- 69 -- -- -- --  07/18/20 1602 (!) 122/57 -- -- 87 -- -- -- --  07/18/20 1547 130/67 -- -- 76 -- -- -- --  07/18/20 1532 (!) 141/82 -- -- 69 -- -- -- --  07/18/20 1516 131/71 -- -- 68 -- -- -- --  07/18/20 1501 131/81 -- -- 71 -- -- -- --  07/18/20 1429 130/71 -- -- 78 -- -- -- --  07/18/20 1404 138/88 98.3 F (36.8 C) Oral 97 20 100 % 5\' 4"  (1.626 m) 276 lb 11.2 oz (125.5 kg)  07/18/20 1402 -- -- -- -- -- 100 % -- --   Constitutional: Well-developed, well-nourished female in no acute distress.  Cardiovascular: normal rate & rhythm, no murmur Respiratory: normal effort, lung sounds clear throughout GI: Abd soft, non-tender, gravid appropriate for gestational age. Pos BS x 4 MS: Extremities nontender, no edema, normal ROM Neurologic: Alert and oriented x 4, not seeing spots GU: no CVA tenderness Pelvic exam deferred  Fetal Tracing: reactive with easily palpated movement once put on monitors Baseline: 125 Variability: moderate Accelerations: 15x15 Decelerations: none Toco: relaxed   Labs: Results for orders placed or performed during the hospital encounter of 07/18/20 (from the past 24 hour(s))  Urinalysis, Routine w reflex microscopic Urine, Clean Catch     Status: Abnormal   Collection Time: 07/18/20  2:08 PM   Result Value Ref Range   Color, Urine YELLOW YELLOW   APPearance CLEAR CLEAR   Specific Gravity, Urine <1.005 (L) 1.005 - 1.030   pH 6.0 5.0 - 8.0   Glucose, UA NEGATIVE NEGATIVE mg/dL   Hgb urine dipstick NEGATIVE NEGATIVE   Bilirubin Urine NEGATIVE NEGATIVE   Ketones, ur NEGATIVE NEGATIVE mg/dL   Protein, ur NEGATIVE NEGATIVE mg/dL   Nitrite NEGATIVE NEGATIVE   Leukocytes,Ua NEGATIVE NEGATIVE  Protein / creatinine ratio, urine     Status: Abnormal   Collection Time: 07/18/20  2:09 PM  Result Value Ref Range   Creatinine, Urine 108.33 mg/dL   Total Protein, Urine 20 mg/dL   Protein Creatinine Ratio 0.18 (H) 0.00 - 0.15 mg/mg[Cre]  CBC     Status: Abnormal   Collection Time: 07/18/20  3:12 PM  Result Value Ref Range   WBC 9.4 4.0 - 10.5 K/uL   RBC 3.75 (L) 3.87 - 5.11 MIL/uL   Hemoglobin 11.3 (L) 12.0 - 15.0 g/dL   HCT 07/20/20 (L) 63.8 - 93.7 %   MCV 92.8 80.0 - 100.0 fL   MCH 30.1 26.0 - 34.0 pg   MCHC 32.5 30.0 - 36.0 g/dL   RDW 34.2 87.6 - 81.1 %   Platelets 238 150 - 400 K/uL   nRBC 0.0 0.0 - 0.2 %  Comprehensive metabolic panel     Status: Abnormal   Collection Time: 07/18/20  3:12 PM  Result Value Ref Range   Sodium 135 135 - 145 mmol/L   Potassium 3.7 3.5 - 5.1 mmol/L   Chloride 106 98 - 111 mmol/L   CO2 21 (L) 22 - 32 mmol/L   Glucose, Bld 86 70 - 99 mg/dL   BUN <5 (L) 6 - 20 mg/dL   Creatinine, Ser 07/20/20 0.44 - 1.00 mg/dL   Calcium 9.1 8.9 - 6.20 mg/dL   Total Protein 6.8 6.5 - 8.1 g/dL   Albumin 2.6 (L) 3.5 - 5.0 g/dL   AST 16 15 - 41 U/L   ALT 17 0 - 44 U/L   Alkaline Phosphatase 143 (H) 38 - 126 U/L   Total Bilirubin 0.3 0.3 - 1.2 mg/dL   GFR, Estimated 35.5 >97 mL/min   Anion gap 8 5 - 15   Imaging:  No results found.  MAU Course: Orders Placed This Encounter  Procedures   Urinalysis, Routine w reflex microscopic Urine, Clean Catch  CBC   Comprehensive metabolic panel   Protein / creatinine ratio, urine   Discharge patient   No orders of the  defined types were placed in this encounter.  MDM: PEC labs normal, pt declined Tylenol for headache. Fetal movement immediately present so no BPP ordered today (had one two days ago 10/10).  Once labs returned, pt reassessed and verbalized feeling better and ready to go home. Has IOL for cHTN scheduled next Saturday.  Assessment: 1. Chronic hypertension affecting pregnancy   2. Decreased fetal movement   3. Decreased fetal movements in third trimester, single or unspecified fetus   4. NST (non-stress test) reactive   5. Fetal movement present during pregnancy in third trimester    Plan: Discharge home in stable condition with return precautions     Follow-up Information     Center for [facility] Follow up.   Specialty: Obstetrics and Gynecology Why: as scheduled for ongoing prenatal care Contact information: [address] [place] [id number] [phone]        [facility] Labor and Delivery. Go to.   Specialty: Obstetrics and Gynecology Why: as scheduled for induction of labor on Saturday Contact information: [address] [phone] [place] [address] [phone]                Allergies as of 07/18/2020       Reactions   Codeine Nausea And Vomiting, Other (See Comments)   Shaking, sweating        Medication List     TAKE these medications    aspirin EC 81 MG tablet Take 1 tablet (81 mg total) by mouth daily.   butalbital-acetaminophen-caffeine 50-325-40 MG tablet Commonly known as: FIORICET Take 1 tablet by mouth every 4 (four) hours as needed for headache.   calcium carbonate 500 MG chewable tablet Commonly known as: TUMS - dosed in mg elemental calcium Chew 1 tablet by mouth as needed for indigestion or heartburn.   Magnesium Malate 1250 (141.7 Mg) MG Tabs 1 tab po qday or qday prn for HA   NIFEdipine 90 MG 24 hr tablet Commonly known as: ADALAT CC Take 1  tablet (90 mg total) by mouth daily.   Prenatal Vitamin and Mineral 28-0.8 MG Tabs Take 1 tablet by mouth daily.       ASK your doctor about these medications    pantoprazole 20 MG tablet Commonly known as: Protonix Take 1 tablet (20 mg total) by mouth 2 (two) times daily as needed.        Edd Arbour, CNM, MSN, IBCLC Certified Nurse Midwife, [facility]

## 2020-07-18 NOTE — MAU Note (Signed)
Started feeling bad yesterday.  Some chest pain, felt like it was beating hard. Was feeling really light headed when walking. Has a bad HA, takes- tylenol- but it doesn't work, hasn't  taken any today. Is on BP medication. Only felt baby move once in last few hours.

## 2020-07-22 ENCOUNTER — Ambulatory Visit (INDEPENDENT_AMBULATORY_CARE_PROVIDER_SITE_OTHER): Payer: Medicaid Other | Admitting: Obstetrics and Gynecology

## 2020-07-22 ENCOUNTER — Inpatient Hospital Stay (HOSPITAL_COMMUNITY)
Admission: AD | Admit: 2020-07-22 | Discharge: 2020-07-24 | DRG: 807 | Disposition: A | Discharge status: Home / Self Care | Payer: Medicaid Other | Attending: Family Medicine | Admitting: Family Medicine

## 2020-07-22 ENCOUNTER — Other Ambulatory Visit: Payer: Self-pay

## 2020-07-22 ENCOUNTER — Ambulatory Visit (INDEPENDENT_AMBULATORY_CARE_PROVIDER_SITE_OTHER): Payer: Medicaid Other

## 2020-07-22 ENCOUNTER — Encounter: Payer: Self-pay | Admitting: Obstetrics and Gynecology

## 2020-07-22 ENCOUNTER — Ambulatory Visit: Payer: Medicaid Other | Admitting: *Deleted

## 2020-07-22 VITALS — BP 164/92 | HR 109 | Wt 277.8 lb

## 2020-07-22 DIAGNOSIS — O1404 Mild to moderate pre-eclampsia, complicating childbirth: Secondary | ICD-10-CM | POA: Diagnosis not present

## 2020-07-22 DIAGNOSIS — O10919 Unspecified pre-existing hypertension complicating pregnancy, unspecified trimester: Secondary | ICD-10-CM

## 2020-07-22 DIAGNOSIS — O361930 Maternal care for other isoimmunization, third trimester, not applicable or unspecified: Secondary | ICD-10-CM

## 2020-07-22 DIAGNOSIS — O99214 Obesity complicating childbirth: Secondary | ICD-10-CM | POA: Diagnosis not present

## 2020-07-22 DIAGNOSIS — Z3A37 37 weeks gestation of pregnancy: Secondary | ICD-10-CM

## 2020-07-22 DIAGNOSIS — Z20822 Contact with and (suspected) exposure to covid-19: Secondary | ICD-10-CM | POA: Diagnosis present

## 2020-07-22 DIAGNOSIS — O119 Pre-existing hypertension with pre-eclampsia, unspecified trimester: Secondary | ICD-10-CM | POA: Diagnosis not present

## 2020-07-22 DIAGNOSIS — Z6841 Body Mass Index (BMI) 40.0 and over, adult: Secondary | ICD-10-CM

## 2020-07-22 DIAGNOSIS — Z8759 Personal history of other complications of pregnancy, childbirth and the puerperium: Secondary | ICD-10-CM

## 2020-07-22 DIAGNOSIS — G8929 Other chronic pain: Secondary | ICD-10-CM

## 2020-07-22 DIAGNOSIS — R519 Headache, unspecified: Secondary | ICD-10-CM | POA: Diagnosis present

## 2020-07-22 DIAGNOSIS — O361990 Maternal care for other isoimmunization, unspecified trimester, not applicable or unspecified: Secondary | ICD-10-CM | POA: Diagnosis present

## 2020-07-22 DIAGNOSIS — O1002 Pre-existing essential hypertension complicating childbirth: Principal | ICD-10-CM | POA: Diagnosis present

## 2020-07-22 DIAGNOSIS — O4202 Full-term premature rupture of membranes, onset of labor within 24 hours of rupture: Secondary | ICD-10-CM | POA: Diagnosis not present

## 2020-07-22 DIAGNOSIS — O99210 Obesity complicating pregnancy, unspecified trimester: Secondary | ICD-10-CM

## 2020-07-22 DIAGNOSIS — N879 Dysplasia of cervix uteri, unspecified: Secondary | ICD-10-CM

## 2020-07-22 DIAGNOSIS — O326XX0 Maternal care for compound presentation, not applicable or unspecified: Secondary | ICD-10-CM | POA: Diagnosis not present

## 2020-07-22 DIAGNOSIS — O0990 Supervision of high risk pregnancy, unspecified, unspecified trimester: Secondary | ICD-10-CM

## 2020-07-22 LAB — CBC
HCT: 35.7 % — ABNORMAL LOW (ref 36.0–46.0)
Hemoglobin: 11.8 g/dL — ABNORMAL LOW (ref 12.0–15.0)
MCH: 30.8 pg (ref 26.0–34.0)
MCHC: 33.1 g/dL (ref 30.0–36.0)
MCV: 93.2 fL (ref 80.0–100.0)
Platelets: 249 10*3/uL (ref 150–400)
RBC: 3.83 MIL/uL — ABNORMAL LOW (ref 3.87–5.11)
RDW: 14.3 % (ref 11.5–15.5)
WBC: 9.6 10*3/uL (ref 4.0–10.5)
nRBC: 0 % (ref 0.0–0.2)

## 2020-07-22 LAB — COMPREHENSIVE METABOLIC PANEL
ALT: 12 U/L (ref 0–44)
AST: 49 U/L — ABNORMAL HIGH (ref 15–41)
Albumin: 2.8 g/dL — ABNORMAL LOW (ref 3.5–5.0)
Alkaline Phosphatase: 153 U/L — ABNORMAL HIGH (ref 38–126)
Anion gap: 10 (ref 5–15)
BUN: 6 mg/dL (ref 6–20)
CO2: 21 mmol/L — ABNORMAL LOW (ref 22–32)
Calcium: 9 mg/dL (ref 8.9–10.3)
Chloride: 102 mmol/L (ref 98–111)
Creatinine, Ser: 0.66 mg/dL (ref 0.44–1.00)
GFR, Estimated: >60 mL/min (ref >60)
Glucose, Bld: 124 mg/dL — ABNORMAL HIGH (ref 70–99)
Potassium: 5.2 mmol/L — ABNORMAL HIGH (ref 3.5–5.1)
Sodium: 133 mmol/L — ABNORMAL LOW (ref 135–145)
Total Bilirubin: 1.5 mg/dL — ABNORMAL HIGH (ref 0.3–1.2)
Total Protein: 6.9 g/dL (ref 6.5–8.1)

## 2020-07-22 LAB — TYPE AND SCREEN
ABO/RH(D): O POS
Antibody Screen: NEGATIVE

## 2020-07-22 LAB — PROTEIN / CREATININE RATIO, URINE
Creatinine, Urine: 78.46 mg/dL
Protein Creatinine Ratio: 0.25 mg/mg{Cre} — ABNORMAL HIGH (ref 0.00–0.15)
Total Protein, Urine: 20 mg/dL

## 2020-07-22 LAB — SARS CORONAVIRUS 2 (TAT 6-24 HRS): SARS Coronavirus 2: NEGATIVE

## 2020-07-22 MED ORDER — SOD CITRATE-CITRIC ACID 500-334 MG/5ML PO SOLN: 30.0000 mL | ORAL | Status: DC | PRN

## 2020-07-22 MED ORDER — LACTATED RINGERS IV SOLN: INTRAVENOUS | Status: DC

## 2020-07-22 MED ORDER — BENZOCAINE-MENTHOL 20-0.5 % EX AERO
1.0000 "application " | INHALATION_SPRAY | CUTANEOUS | Status: DC | PRN
  Administered 2020-07-23: 1 via TOPICAL
  Filled 2020-07-22: qty 56

## 2020-07-22 MED ORDER — LABETALOL HCL 5 MG/ML IV SOLN: 80.0000 mg | INTRAVENOUS | Status: DC | PRN

## 2020-07-22 MED ORDER — ACETAMINOPHEN 325 MG PO TABS
650.0000 mg | ORAL_TABLET | Freq: Four times a day (QID) | ORAL | Status: DC
  Administered 2020-07-22 – 2020-07-24 (×8): 650 mg via ORAL
  Filled 2020-07-22 (×8): qty 2

## 2020-07-22 MED ORDER — IBUPROFEN 600 MG PO TABS
600.0000 mg | ORAL_TABLET | Freq: Four times a day (QID) | ORAL | Status: DC
  Administered 2020-07-22 – 2020-07-24 (×7): 600 mg via ORAL
  Filled 2020-07-22 (×7): qty 1

## 2020-07-22 MED ORDER — PRENATAL MULTIVITAMIN CH
1.0000 | ORAL_TABLET | Freq: Every day | ORAL | Status: DC
  Administered 2020-07-23 – 2020-07-24 (×2): 1 via ORAL
  Filled 2020-07-22 (×2): qty 1

## 2020-07-22 MED ORDER — DIBUCAINE (PERIANAL) 1 % EX OINT: 1.0000 "application " | TOPICAL_OINTMENT | CUTANEOUS | Status: DC | PRN

## 2020-07-22 MED ORDER — LIDOCAINE HCL (PF) 1 % IJ SOLN: 30.0000 mL | INTRAMUSCULAR | Status: DC | PRN

## 2020-07-22 MED ORDER — SIMETHICONE 80 MG PO CHEW: 80.0000 mg | CHEWABLE_TABLET | ORAL | Status: DC | PRN

## 2020-07-22 MED ORDER — ONDANSETRON HCL 4 MG/2ML IJ SOLN: 4.0000 mg | Freq: Four times a day (QID) | INTRAMUSCULAR | Status: DC | PRN

## 2020-07-22 MED ORDER — FENTANYL CITRATE (PF) 100 MCG/2ML IJ SOLN: 50.0000 ug | INTRAMUSCULAR | Status: DC | PRN

## 2020-07-22 MED ORDER — ACETAMINOPHEN 325 MG PO TABS: 650.0000 mg | ORAL_TABLET | ORAL | Status: DC | PRN

## 2020-07-22 MED ORDER — LABETALOL HCL 5 MG/ML IV SOLN: 40.0000 mg | INTRAVENOUS | Status: DC | PRN

## 2020-07-22 MED ORDER — TERBUTALINE SULFATE 1 MG/ML IJ SOLN: 0.2500 mg | Freq: Once | INTRAMUSCULAR | Status: DC | PRN

## 2020-07-22 MED ORDER — ONDANSETRON HCL 4 MG PO TABS: 4.0000 mg | ORAL_TABLET | ORAL | Status: DC | PRN

## 2020-07-22 MED ORDER — HYDRALAZINE HCL 20 MG/ML IJ SOLN: 10.0000 mg | INTRAMUSCULAR | Status: DC | PRN

## 2020-07-22 MED ORDER — DIPHENHYDRAMINE HCL 25 MG PO CAPS: 25.0000 mg | ORAL_CAPSULE | Freq: Four times a day (QID) | ORAL | Status: DC | PRN

## 2020-07-22 MED ORDER — SENNOSIDES-DOCUSATE SODIUM 8.6-50 MG PO TABS
2.0000 | ORAL_TABLET | Freq: Every day | ORAL | Status: DC
  Administered 2020-07-23 – 2020-07-24 (×2): 2 via ORAL
  Filled 2020-07-22 (×2): qty 2

## 2020-07-22 MED ORDER — LACTATED RINGERS IV SOLN: 500.0000 mL | INTRAVENOUS | Status: DC | PRN

## 2020-07-22 MED ORDER — NIFEDIPINE ER OSMOTIC RELEASE 30 MG PO TB24
30.0000 mg | ORAL_TABLET | Freq: Every day | ORAL | Status: DC
  Administered 2020-07-23 – 2020-07-24 (×2): 30 mg via ORAL
  Filled 2020-07-22 (×2): qty 1

## 2020-07-22 MED ORDER — WITCH HAZEL-GLYCERIN EX PADS: 1.0000 "application " | MEDICATED_PAD | CUTANEOUS | Status: DC | PRN

## 2020-07-22 MED ORDER — LABETALOL HCL 5 MG/ML IV SOLN: 20.0000 mg | INTRAVENOUS | Status: DC | PRN

## 2020-07-22 MED ORDER — COCONUT OIL OIL
1.0000 "application " | TOPICAL_OIL | Status: DC | PRN
  Administered 2020-07-23: 1 via TOPICAL

## 2020-07-22 MED ORDER — OXYTOCIN-SODIUM CHLORIDE 30-0.9 UT/500ML-% IV SOLN
1.0000 m[IU]/min | INTRAVENOUS | Status: DC
  Administered 2020-07-22: 2 m[IU]/min via INTRAVENOUS

## 2020-07-22 MED ORDER — TETANUS-DIPHTH-ACELL PERTUSSIS 5-2.5-18.5 LF-MCG/0.5 IM SUSY: 0.5000 mL | PREFILLED_SYRINGE | Freq: Once | INTRAMUSCULAR | Status: DC

## 2020-07-22 MED ORDER — OXYTOCIN-SODIUM CHLORIDE 30-0.9 UT/500ML-% IV SOLN
2.5000 [IU]/h | INTRAVENOUS | Status: DC
  Filled 2020-07-22: qty 500

## 2020-07-22 MED ORDER — OXYTOCIN BOLUS FROM INFUSION
333.0000 mL | Freq: Once | INTRAVENOUS | Status: AC
  Administered 2020-07-22: 333 mL via INTRAVENOUS

## 2020-07-22 MED ORDER — ONDANSETRON HCL 4 MG/2ML IJ SOLN: 4.0000 mg | INTRAMUSCULAR | Status: DC | PRN

## 2020-07-22 NOTE — Progress Notes (Signed)
Subjective:  Carol Blair is a 30 y.o. [phone] at [redacted]w[redacted]d being seen today for ongoing prenatal care.  She is currently monitored for the following issues for this high-risk pregnancy and has Cervical dysplasia; Supervision of high risk pregnancy, antepartum; Chronic headaches; BMI 40.0-44.9, adult (HCC); Maternal morbid obesity, antepartum (HCC); History of severe pre-eclampsia; Lewis isoimmunization during pregnancy; and Chronic hypertension affecting pregnancy on their problem list.  Patient reports HA and gentle malaise.  Contractions: Not present. Vag. Bleeding: None.  Movement: Present. Denies leaking of fluid.   The following portions of the patient's history were reviewed and updated as appropriate: allergies, current medications, past family history, past medical history, past social history, past surgical history and problem list. Problem list updated.  Objective:   Vitals:   07/22/20 1057 07/22/20 1118  BP: (!) 160/85 (!) 164/92  Pulse: (!) 109   Weight: 277 lb 12.8 oz (126 kg)     Fetal Status: Fetal Heart Rate (bpm): NST   Movement: Present     General:  Alert, oriented and cooperative. Patient is in no acute distress.  Skin: Skin is warm and dry. No rash noted.   Cardiovascular: Normal heart rate noted  Respiratory: Normal respiratory effort, no problems with respiration noted  Abdomen: Soft, gravid, appropriate for gestational age. Pain/Pressure: Present     Pelvic:  Cervical exam deferred        Extremities: Normal range of motion.  Edema: None  Mental Status: Normal mood and affect. Normal behavior. Normal judgment and thought content.   Urinalysis:      Assessment and Plan:  Pregnancy: [id number] at [redacted]w[redacted]d  1. Supervision of high risk pregnancy, antepartum To L & D for IOL  2. Chronic hypertension affecting pregnancy BP as noted above Severe BP today in office To L & D  3. Cervical dysplasia Colpo PP  4. Lewis isoimmunization during pregnancy in third  trimester, single or unspecified fetus   5. History of severe pre-eclampsia   6. Chronic intractable headache, unspecified headache type   Term labor symptoms and general obstetric precautions including but not limited to vaginal bleeding, contractions, leaking of fluid and fetal movement were reviewed in detail with the patient. Please refer to After Visit Summary for other counseling recommendations.  Return for 1 week for BP check, 4 weeks for PP visit.   Hermina Staggers, MD

## 2020-07-22 NOTE — Discharge Summary (Signed)
Postpartum Discharge Summary      Patient Name: Carol Blair DOB: [birth year] MRN: [id number]  Date of admission: 07/22/2020 Delivery date:07/22/2020  Delivering provider: Randa Ngo  Date of discharge: 07/24/2020  Admitting diagnosis: Chronic hypertension affecting pregnancy [O10.919] Intrauterine pregnancy: [redacted]w[redacted]d    Secondary diagnosis:  Principal Problem:   Vaginal delivery Active Problems:   Cervical dysplasia   Chronic headaches   BMI 40.0-44.9, adult (HTallula   History of severe pre-eclampsia   Lewis isoimmunization during pregnancy   Chronic hypertension affecting pregnancy  Additional problems: as noted above    Discharge diagnosis: Term Pregnancy Delivered                                              Post partum procedures: None Augmentation: AROM, Pitocin, and IP Foley Complications: None  Hospital course: Induction of Labor With Vaginal Delivery   30y.o. yo [id number] admitted to the hospital 07/22/2020 for induction of labor.  Indication for induction:  worsening cHTN .  FB placed on admission with immediate SROM for clear fluid. Pt was then started on pitocin and progressed to complete cervical dilation. Membrane Rupture Time/Date: 1:14 PM ,07/22/2020   Delivery Method:Vaginal, Spontaneous  Episiotomy: None  Lacerations:  None  Details of delivery can be found in separate delivery note. Patient had a routine postpartum course. Patient is discharged home 07/24/20.  Newborn Data: Birth date:07/22/2020  Birth time:6:34 PM  Gender:Female  Living status:Living  Apgars:9 ,9  Weight:2676 g   Magnesium Sulfate received: No BMZ received: No Rhophylac:N/A MMR:No T-DaP: Not given in hospital Flu: N/A Transfusion:No  Physical exam  Vitals:   07/23/20 0600 07/23/20 0820 07/23/20 2055 07/24/20 0500  BP: 133/78 130/80 130/78 140/85  Pulse: (!) 58 (!) 57 60 60  Resp: _0 Temp: 98 F (36.7 C) 98.7 F (37.1 C) 98 F (36.7 C) 98.4 F (36.9  C)  TempSrc: Oral Oral Oral Oral  SpO2: 100% 100% 100% 100%  Weight:      Height:       General: alert and no distress Lochia: appropriate per patient Uterine Fundus: firm Incision: N/A DVT Evaluation: No evidence of DVT seen on physical exam. Labs: Lab Results  Component Value Date   WBC 12.5 (H) 07/23/2020   HGB 11.0 (L) 07/23/2020   HCT 33.2 (L) 07/23/2020   MCV 93.0 07/23/2020   PLT 241 07/23/2020   CMP Latest Ref Rng & Units 07/23/2020  Glucose 70 - 99 mg/dL 82  BUN 6 - 20 mg/dL <5(L)  Creatinine 0.44 - 1.00 mg/dL 0.59  Sodium 135 - 145 mmol/L 136  Potassium 3.5 - 5.1 mmol/L 3.7  Chloride 98 - 111 mmol/L 108  CO2 22 - 32 mmol/L 23  Calcium 8.9 - 10.3 mg/dL 9.1  Total Protein 6.5 - 8.1 g/dL 6.4(L)  Total Bilirubin 0.3 - 1.2 mg/dL 0.5  Alkaline Phos 38 - 126 U/L 124  AST 15 - 41 U/L 18  ALT 0 - 44 U/L 14   Edinburgh Score: Edinburgh Postnatal Depression Scale Screening Tool 07/23/2020  I have been able to laugh and see the funny side of things. 0  I have looked forward with enjoyment to things. 0  I have blamed myself unnecessarily when things went wrong. 1  I have been anxious or worried  for no good reason. 0  I have felt scared or panicky for no good reason. 0  Things have been getting on top of me. 0  I have been so unhappy that I have had difficulty sleeping. 0  I have felt sad or miserable. 0  I have been so unhappy that I have been crying. 0  The thought of harming myself has occurred to me. 0  Edinburgh Postnatal Depression Scale Total 1     After visit meds:  Allergies as of 07/24/2020       Reactions   Codeine Nausea And Vomiting, Other (See Comments)   Shaking, sweating        Medication List     STOP taking these medications    aspirin EC 81 MG tablet   butalbital-acetaminophen-caffeine 50-325-40 MG tablet Commonly known as: FIORICET   calcium carbonate 500 MG chewable tablet Commonly known as: TUMS - dosed in mg elemental calcium        TAKE these medications    NIFEdipine 60 MG 24 hr tablet Commonly known as: ADALAT CC Take 1 tablet (60 mg total) by mouth daily. What changed:  medication strength how much to take   Prenatal Vitamin and Mineral 28-0.8 MG Tabs Take 1 tablet by mouth daily.         Discharge home in stable condition Infant Feeding: Breast Infant Disposition:home with mother Discharge instruction: per After Visit Summary and Postpartum booklet. Activity: Advance as tolerated. Pelvic rest for 6 weeks.  Diet: routine diet Future Appointments: Future Appointments  Date Time Provider Linton Hall  07/29/2020  1:30 PM [facility] NURSE [facility]  08/23/2020  9:35 AM Aletha Halim, MD [facility]   Follow up Visit: Message sent to [facility]  Please schedule this patient for a In person postpartum visit in 6 weeks with the following provider: Any provider. Additional Postpartum F/U:BP check 1 week  Low risk pregnancy complicated by:  cHTN with h/o preeclampsia in prior pregnancy Delivery mode:  Vaginal, Spontaneous  Anticipated Birth Control:  Unsure (considering partner vasectomy)   07/24/2020 Renard Matter, MD OB Fellow, Faculty Practice

## 2020-07-22 NOTE — H&P (Signed)
OBSTETRIC ADMISSION HISTORY AND PHYSICAL  Carol Blair is a 30 y.o. female [phone] with IUP at [redacted]w[redacted]d by L/11 presenting for IOL secondary to worsening cHTN with possible superimposed preeclampsia given several severe range blood pressures in clinic. She reports +FMs, No LOF, no VB, no blurry vision, headaches or peripheral edema, and RUQ pain.  She plans on breast feeding. She undecided for birth control, but considering partner vasectomy. She received her prenatal care at  [facility].    Dating: By L/11 --->  Estimated Date of Delivery: 08/10/20  Sono:  @[redacted]w[redacted]d , CWD, normal anatomy, cephalic presentation, 2383g, EFW   Prenatal History/Complications:  - worsening cHTN (procardia 90mg  daily in pregnancy; pt reports taking unknown blood pressure medication outside of pregnancy) - Lewis isoimmunization during pregnancy - h/o severe preeclampsia - chronic headaches - Obesity (BMI 47 on admission) - h/o cervical dysplasia (HSIL with +HRHPV in 01/2020)  Past Medical History: Past Medical History:  Diagnosis Date   Benign essential hypertension antepartum 02/11/2011   Low TSH level 02/01/2020   [ ]  needs f/u ft4  [x]  f/u ft4: neg [x]  low tsh with 28wk labs   Obesity    Vaginal Pap smear, abnormal     Past Surgical History: Past Surgical History:  Procedure Laterality Date   LAPAROSCOPIC CHOLECYSTECTOMY W/ CHOLANGIOGRAPHY  06/10/10   Dr 02/03/2020    Obstetrical History: OB History     Gravida  5   Para  3   Term  3   Preterm      AB  1   Living  3      SAB  1   IAB  0   Ectopic  0   Multiple  0   Live Births  3           Social History Social History   Socioeconomic History   Marital status: Single    Spouse name: Not on file   Number of children: Not on file   Years of education: Not on file   Highest education level: Not on file  Occupational History   Not on file  Tobacco Use   Smoking status: Never   Smokeless tobacco: Never  Vaping Use   Vaping  Use: Never used  Substance and Sexual Activity   Alcohol use: No   Drug use: No   Sexual activity: Not Currently    Birth control/protection: I.U.D.  Other Topics Concern   Not on file  Social History Narrative   Not on file   Social Determinants of Health   Financial Resource Strain: Not on file  Food Insecurity: No Food Insecurity   Worried About Running Out of Food in the Last Year: Never true   Ran Out of Food in the Last Year: Never true  Transportation Needs: No Transportation Needs   Lack of Transportation (Medical): No   Lack of Transportation (Non-Medical): No  Physical Activity: Not on file  Stress: Not on file  Social Connections: Not on file    Family History: Family History  Problem Relation Age of Onset   Hypertension Mother    Hypertension Sister    Kidney disease Sister    Cancer Maternal Grandmother        GI   Breast cancer Maternal Aunt    Cancer Maternal Uncle        colon    Allergies: Allergies  Allergen Reactions   Codeine Nausea And Vomiting and Other (See Comments)    Shaking,  sweating    Medications Prior to Admission  Medication Sig Dispense Refill Last Dose   aspirin EC 81 MG tablet Take 1 tablet (81 mg total) by mouth daily. 60 tablet 1    butalbital-acetaminophen-caffeine (FIORICET) 50-325-40 MG tablet Take 1 tablet by mouth every 4 (four) hours as needed for headache. 30 tablet 1    calcium carbonate (TUMS - DOSED IN MG ELEMENTAL CALCIUM) 500 MG chewable tablet Chew 1 tablet by mouth as needed for indigestion or heartburn.      NIFEdipine (ADALAT CC) 90 MG 24 hr tablet Take 1 tablet (90 mg total) by mouth daily. 30 tablet 5    Prenatal Vit-Fe Fumarate-FA (PRENATAL VITAMIN AND MINERAL) 28-0.8 MG TABS Take 1 tablet by mouth daily. 60 tablet 2      Review of Systems   All systems reviewed and negative except as stated in HPI  Blood pressure (!) 146/85, pulse 93, temperature 97.9 F (36.6 C), temperature source Oral, resp. rate  18, height 5\' 4"  (1.626 m), weight 125.6 kg, last menstrual period 11/04/2019, unknown if currently breastfeeding. General appearance: alert, cooperative, and appears stated age Lungs: normal WOB Heart: regular rate  Abdomen: soft, non-tender Extremities: no sign of DVT Presentation: cephalic Fetal monitoringBaseline: 120 bpm, Variability: Good {> 6 bpm), Accelerations: Reactive, and Decelerations: Absent Uterine activity: irregular contractions Dilation: 1.5 Effacement (%): Thick Station:  (high) Exam by:: Dr [email or web address]  Prenatal labs: ABO, Rh: --/--/O POS (05/25 1816) Antibody: NEG (05/25 1816) Rubella: 7.32 (12/15 1220) RPR: Non Reactive (05/19 0809)  HBsAg: Negative (12/15 1220)  HIV: Non Reactive (05/19 0809)  GBS: Negative/-- (07/01 0945)  2 hr Glucola wnl Genetic screening low risk Anatomy 03-12-2005 wnl  Prenatal Transfer Tool  Maternal Diabetes: No Genetic Screening: Normal Maternal Ultrasounds/Referrals: Normal Fetal Ultrasounds or other Referrals:  Referred to Materal Fetal Medicine  Maternal Substance Abuse:  No Significant Maternal Medications:  Meds include: Other: procardia Significant Maternal Lab Results: Group B Strep negative  No results found for this or any previous visit (from the past 24 hour(s)).  Patient Active Problem List   Diagnosis Date Noted   Chronic hypertension affecting pregnancy 05/29/2020   Lewis isoimmunization during pregnancy 02/01/2020   BMI 40.0-44.9, adult (HCC) 01/31/2020   Maternal morbid obesity, antepartum (HCC) 01/31/2020   History of severe pre-eclampsia 01/31/2020   Chronic headaches 08/29/2018   Supervision of high risk pregnancy, antepartum 06/13/2015   Cervical dysplasia 04/09/2011    Assessment/Plan:  Carol Blair is a 30 y.o. 38 at [redacted]w[redacted]d here for IOL secondary to worsening cHTN with possible superimposed preeclampsia.  #Labor: Given initial cervical exam, FB placed s/p shared decision making with pt. SROM for clear  fluid with FB insertion. Will plan to start low dose pitocin and up-titrate s/p expulsion of FB. #Pain: TBD per pt request #FWB: Category 1 strip #ID: GBS negative #MOF: breast #MOC: considering partner vasectomy #Circ: desired #Worsening cHTN  Chronic HTN: pt taking procardia 90mg  daily with good adherence (last dose this morning). Pt currently asymptomatic. Pt with several severe range blood pressures in clinic this morning but on arrival to L&D all mild range BPs. F/u preeclampsia labs and have low threshold to treat with magnesium if pt with recurrent severe range blood pressures on L&D.  [redacted]w[redacted]d, MD OB Fellow, Faculty Practice 07/22/2020 1:40 PM

## 2020-07-22 NOTE — Lactation Note (Addendum)
This note was copied from a baby's chart. Lactation Consultation Note  Patient Name: Carol Blair JSEGB'T Date: 07/22/2020 Reason for consult: L&D Initial assessment;Mother's request;1st time breastfeeding;Early term 37-38.6wks;Maternal endocrine disorder ( Low TSH) GHTN Age:31 hours  Mom did not breastfeed her other children. Infant thick labial attachment and high palate. He tends to suck his tongue and latch shallow. Infant placed in sideline football get a deeper latch with Dad assisting. Mom denied any pain with the latch.   LC reviewed feeding cues and behavior of early term feeding 8-12x in 24 hr period no more than 3 hrs without an attempt.   Mom to receive further LC support on the floor.  Maternal Data Has patient been taught Hand Expression?: Yes Does the patient have breastfeeding experience prior to this delivery?: No  Feeding Mother's Current Feeding Choice: Breast Milk  LATCH Score Latch: Repeated attempts needed to sustain latch, nipple held in mouth throughout feeding, stimulation needed to elicit sucking reflex.  Audible Swallowing: A few with stimulation  Type of Nipple: Everted at rest and after stimulation  Comfort (Breast/Nipple): Soft / non-tender  Hold (Positioning): Assistance needed to correctly position infant at breast and maintain latch.  LATCH Score: 7   Lactation Tools Discussed/Used    Interventions Interventions: Breast feeding basics reviewed;Breast compression;Assisted with latch;Adjust position;Skin to skin;Support pillows;Breast massage;Hand express;Expressed milk;Education  Discharge    Consult Status Consult Status: Follow-up Date: 07/23/20 Follow-up type: In-patient    [name]  [name] 07/22/2020, 7:26 PM

## 2020-07-22 NOTE — Progress Notes (Signed)
Labor Progress Note ILYANA MANUELE is a 30 y.o. [phone] at [redacted]w[redacted]d presented for IOL secondary to worsening cHTN versus superimposed preeclampsia.  S: Pt reports increasing discomfort with contractions. No headache, vision changes or other concerns at this time.  O:  BP 129/67   Pulse 61   Temp (!) 97.5 F (36.4 C) (Oral)   Resp 18   Ht 5\' 4"  (1.626 m)   Wt 125.6 kg   LMP 11/04/2019   BMI 47.55 kg/m  EFM: baseline 120/moderate variability/+accels/single prolonged decel now resolved s/p FB expulsion Toco: contractions every 1-2 min  CVE: Dilation: 5 Effacement (%): 70 Station: -1 Presentation: Vertex Exam by:: Wesam Gearhart   A&P: 30 y.o. 38 [redacted]w[redacted]d presented for IOL secondary to worsening cHTN versus superimposed preeclampsia. #Labor: Progressing well. Now s/p FB (1310-1510) and SROM for clear fluid at time of FB placement. Low dose pitocin started at 1330 but discontinued at 1505 given prolonged decel. Will plan to restart pitocin in 30 min pending fetal tolerance. #Pain: TBD per pt preference #FWB: Category 2 strip given single prolonged decel immediately prior to FB removal. Reassuringly, now Category 1 s/p maternal repositioning. Will continue to monitor. #GBS negative #cHTN: Pt with 1 severe range blood pressure since admission but normal on recheck. Pt asymptomatic. Preeclampsia labs on admission notable for Hgb 11.8, AST 49 and K 5.2. Will plan to trend labs q12 hours and monitor closely.  [redacted]w[redacted]d, MD 3:16 PM

## 2020-07-22 NOTE — Patient Instructions (Signed)
Vaginal Delivery ?Vaginal delivery means that you give birth by pushing your baby out of your birth canal (vagina). Your health care team will help you before, during, and after vaginal delivery. ?Birth experiences are unique for every woman and every pregnancy, and birth experiences vary depending on where you choose to give birth. ?What are the risks and benefits? ?Generally, this is safe. However, problems may occur, including: ?Bleeding. ?Infection. ?Damage to other structures such as vaginal tearing. ?Allergic reactions to medicines. ?Despite the risks, benefits of vaginal delivery include less risk of bleeding and infection and a shorter recovery time compared to a Cesarean delivery. Cesarean delivery, or C-section, is the surgical delivery of a baby. ?What happens when I arrive at the birth center or hospital? ?Once you are in labor and have been admitted into the hospital or birth center, your health care team may: ?Review your pregnancy history and any concerns that you have. ?Talk with you about your birth plan and discuss pain control options. ?Check your blood pressure, breathing, and heartbeat. ?Assess your baby's heartbeat. ?Monitor your uterus for contractions. ?Check whether your bag of water (amniotic sac) has broken (ruptured). ?Insert an IV into one of your veins. This may be used to give you fluids and medicines. ?Monitoring ?Your health care team may assess your contractions (uterine monitoring) and your baby's heart rate (fetal monitoring). You may need to be monitored: ?Often, but not continuously (intermittently). ?All the time or for long periods at a time (continuously). Continuous monitoring may be needed if: ?You are taking certain medicines, such as medicine to relieve pain or make your contractions stronger. ?You have pregnancy or labor complications. ?Monitoring may be done by: ?Placing a special stethoscope or a handheld monitoring device on your abdomen to check your baby's heartbeat  and to check for contractions. ?Placing monitors on your abdomen (external monitors) to record your baby's heartbeat and the frequency and length of contractions. ?Placing monitors inside your uterus through your vagina (internal monitors) to record your baby's heartbeat and the frequency, length, and strength of your contractions. Depending on the type of monitor, it may remain in your uterus or on your baby's head until birth. ?Telemetry. This is a type of continuous monitoring that can be done with external or internal monitors. Instead of having to stay in bed, you are able to move around. ?Physical exam ?Your health care team may perform frequent physical exams. This may include: ?Checking how and where your baby is positioned in your uterus. ?Checking your cervix to determine: ?Whether it is thinning out (effacing). ?Whether it is opening up (dilating). ?What happens during labor and delivery? ?Normal labor and delivery is divided into the following three stages: ?Stage 1 ?This is the longest stage of labor. ?Throughout this stage, you will feel contractions. Contractions generally feel mild, infrequent, and irregular at first. They get stronger, more frequent, and more regular as you move through this stage. You may have contractions about every 2-3 minutes. ?This stage ends when your cervix is completely dilated to 4 inches (10 cm) and completely effaced. ?Stage 2 ?This stage starts once your cervix is completely effaced and dilated and lasts until the delivery of your baby. ?This is the stage where you will feel an urge to push your baby out of your vagina. ?You may feel stretching and burning pain, especially when the widest part of your baby's head passes through the vaginal opening (crowning). ?Once your baby is delivered, the umbilical cord will be clamped and   cut. Timing of cutting the cord will depend on your wishes, your baby's health, and your health care provider's practices. ?Your baby will be  placed on your bare chest (skin-to-skin contact) in an upright position and covered with a warm blanket. If you are choosing to breastfeed, watch your baby for feeding cues, like rooting or sucking, and help the baby to your breast for his or her first feeding. ?Stage 3 ?This stage starts immediately after the birth of your baby and ends after you deliver the placenta. ?This stage may take anywhere from 5 to 30 minutes. ?After your baby has been delivered, you will feel contractions as your body expels the placenta. These contractions also help your uterus get smaller and reduce bleeding. ?What can I expect after labor and delivery? ?After labor is over, you and your baby will be assessed closely until you are ready to go home. Your health care team will teach you how to care for yourself and your baby. ?You and your baby may be encouraged to stay in the same room (rooming in) during your hospital stay. This will help promote early bonding and successful breastfeeding. ?Your uterus will be checked and massaged regularly (fundal massage). ?You may continue to receive fluids and medicines through an IV. ?You will have some soreness and pain in your abdomen, vagina, and the area of skin between your vaginal opening and your anus (perineum). ?If an incision was made near your vagina (episiotomy) or if you had some vaginal tearing during delivery, cold compresses may be placed on your episiotomy or your tear. This helps to reduce pain and swelling. ?It is normal to have vaginal bleeding after delivery. Wear a sanitary pad for vaginal bleeding and discharge. ?Summary ?Vaginal delivery means that you will give birth by pushing your baby out of your birth canal (vagina). ?Your health care team will monitor you and your baby throughout the stages of labor. ?After you deliver your baby, your health care team will continue to assess you and your baby to ensure you are both recovering as expected after delivery. ?This  information is not intended to replace advice given to you by your health care provider. Make sure you discuss any questions you have with your health care provider. ?Document Revised: 11/20/2019 Document Reviewed: 11/20/2019 ?Elsevier Patient Education ? 2022 [employer]vier Inc[employer] ? ?

## 2020-07-22 NOTE — Lactation Note (Signed)
This note was copied from a baby's chart. Lactation Consultation Note  Patient Name: Carol Blair DEYCX'K Date: 07/22/2020 Reason for consult: Initial assessment;Early term 37-38.6wks;Other (Comment) (low blood sugar of 38 mom will continue to do lots of STS with infant. Mom prefers to offer donor breast milk in morning if needed.) Age:31 hours, P4,  ETI female infant, less than 6 lbs and this is mom's first time breastfeeding  Per mom, infant BF for 15 minutes on her right breast using the football hold position, as LC walked in room, infant latched on the right breast using the football hold position and BF additional 5 minutes.  Infant's total feeding was 20 minutes. Mom wants to continue to BF infant according to cues, 8 to 12 or more times within 24 hours and give infant back any of her own EBM tonight. Mom will choose to supplement infant in morning with donor breast milk if his blood sugar continue to be low. Mom was doing STS afterwards with infant due  to infant having low blood sugar  and low body temperature. Mom was taught hand expression and infant was given 1 ml of colostrum by spoon. LC explained how to use the DEBP, mom will pump every 3 hours for 15 minutes on initial setting. Mom shown how to use DEBP & how to disassemble, clean, & reassemble parts.  LC dicussed infant's input and output with parents. Mom made aware of O/P services, breastfeeding support groups, community resources, and our phone # for post-discharge questions.    Maternal Data Has patient been taught Hand Expression?: Yes Does the patient have breastfeeding experience prior to this delivery?: No  Feeding Mother's Current Feeding Choice: Breast Milk  LATCH Score Latch: Grasps breast easily, tongue down, lips flanged, rhythmical sucking.  Audible Swallowing: A few with stimulation  Type of Nipple: Everted at rest and after stimulation  Comfort (Breast/Nipple): Soft / non-tender  Hold (Positioning):  Assistance needed to correctly position infant at breast and maintain latch.  LATCH Score: 8   Lactation Tools Discussed/Used Tools: Pump Breast pump type: Double-Electric Breast Pump Pump Education: Setup, frequency, and cleaning;Milk Storage Reason for Pumping: Infant is ETI, low blood sugar and low temps. Pumping frequency: Mom will pump every 3 hours for 15 minutes on inital setting.  Interventions Interventions: Breast feeding basics reviewed;Assisted with latch;Skin to skin;Breast massage;Hand express;Breast compression;Adjust position;Support pillows;Position options;Expressed milk;DEBP;Education  Discharge Pump: Personal;DEBP WIC Program: No  Consult Status Consult Status: Follow-up Date: 07/23/20 Follow-up type: In-patient    Danelle Earthly 07/22/2020, 11:20 PM

## 2020-07-22 NOTE — Progress Notes (Signed)
Pt reports H/A today - pain scale 6.  She states that her head always hurts. She has occasional dizziness today. She had MAU visit on 7/14.  She also reports small amount of fluid leaking yesterday - none today.

## 2020-07-22 NOTE — Progress Notes (Signed)

## 2020-07-23 LAB — COMPREHENSIVE METABOLIC PANEL
ALT: 14 U/L (ref 0–44)
AST: 18 U/L (ref 15–41)
Albumin: 2.5 g/dL — ABNORMAL LOW (ref 3.5–5.0)
Alkaline Phosphatase: 124 U/L (ref 38–126)
Anion gap: 5 (ref 5–15)
BUN: <5 mg/dL — ABNORMAL LOW (ref 6–20)
CO2: 23 mmol/L (ref 22–32)
Calcium: 9.1 mg/dL (ref 8.9–10.3)
Chloride: 108 mmol/L (ref 98–111)
Creatinine, Ser: 0.59 mg/dL (ref 0.44–1.00)
GFR, Estimated: >60 mL/min (ref >60)
Glucose, Bld: 82 mg/dL (ref 70–99)
Potassium: 3.7 mmol/L (ref 3.5–5.1)
Sodium: 136 mmol/L (ref 135–145)
Total Bilirubin: 0.5 mg/dL (ref 0.3–1.2)
Total Protein: 6.4 g/dL — ABNORMAL LOW (ref 6.5–8.1)

## 2020-07-23 LAB — RPR: RPR Ser Ql: NONREACTIVE

## 2020-07-23 LAB — CBC
HCT: 33.2 % — ABNORMAL LOW (ref 36.0–46.0)
Hemoglobin: 11 g/dL — ABNORMAL LOW (ref 12.0–15.0)
MCH: 30.8 pg (ref 26.0–34.0)
MCHC: 33.1 g/dL (ref 30.0–36.0)
MCV: 93 fL (ref 80.0–100.0)
Platelets: 241 10*3/uL (ref 150–400)
RBC: 3.57 MIL/uL — ABNORMAL LOW (ref 3.87–5.11)
RDW: 14.5 % (ref 11.5–15.5)
WBC: 12.5 10*3/uL — ABNORMAL HIGH (ref 4.0–10.5)
nRBC: 0 % (ref 0.0–0.2)

## 2020-07-23 NOTE — Progress Notes (Signed)
Post Partum Day 1 Subjective: Patient is doing well without complaints. Ambulating without difficulty. Voiding and passing flatus. Tolerating PO. Abdominal pain improved. Vaginal bleeding decreased.  Objective: Blood pressure 133/78, pulse (!) 58, temperature 98 F (36.7 C), temperature source Oral, resp. rate 18, height 5\' 4"  (1.626 m), weight 125.6 kg, last menstrual period 11/04/2019, SpO2 100 %, unknown if currently breastfeeding.  Physical Exam:  General: alert, cooperative, and no distress Lochia: appropriate Uterine Fundus: firm Incision: n/a DVT Evaluation: No evidence of DVT seen on physical exam.  Recent Labs    07/22/20 1243 07/23/20 0516  HGB 11.8* 11.0*  HCT 35.7* 33.2*    Assessment/Plan: PPD#1  -doing well, meeting pp milestones  -hgb stable  -breastfeeding, going well  -vasectomy for partner, counseled on interval contraception, undecided  -desires circ for baby, consented  #cHTN  -procardia 90mg  xl daily during pregnancy  -switched to 30 mg xl daily postpartum  -asymptomatic  -preE labs today unremarkable  Plan for discharge tomorrow given time of delivery.   LOS: 1 day   07/25/20 07/23/2020, 6:50 AM

## 2020-07-23 NOTE — Lactation Note (Signed)
This note was copied from a baby's chart. Lactation Consultation Note Attempted to see mom. Everyone sleeping.  Patient Name: Carol Blair [id number] Date: 07/23/2020   Age:31 hours  Maternal Data    Feeding Nipple Type: Slow - flow  LATCH Score                    Lactation Tools Discussed/Used    Interventions    Discharge    Consult Status      [name] 07/23/2020, 11:23 PM

## 2020-07-24 ENCOUNTER — Other Ambulatory Visit (HOSPITAL_COMMUNITY): Payer: Self-pay

## 2020-07-24 MED ORDER — NIFEDIPINE ER 60 MG PO TB24
60.0000 mg | ORAL_TABLET | Freq: Every day | ORAL | 2 refills | Status: DC
  Filled 2020-07-24: qty 14, 14d supply, fill #0

## 2020-07-24 NOTE — Lactation Note (Addendum)
This note was copied from a baby's chart. Lactation Consultation Note  Patient Name: Carol Blair Date: 07/24/2020 Reason for consult: Follow-up assessment;Mother's request;Difficult latch;1st time breastfeeding;Early term 37-38.6wks;Infant < 6lbs;Infant weight loss;Hyperbilirubinemia Age:31 hours  On arrival, infant in a outfit. Mom stated 10 min ago given a bottle of EBM infant only took 10 ml fell asleep. LC reviewed with mother behavior of LPTI, how to reduce calorie loss keeping him STS, offering breast and then supplementing after every feeding. Mom mostly bottle feeding with DBM or EBM 10-20 ml, but not latching at the breast.   Infant not had adequate urine and stool output. LC encouraged Mom to offer more since he is not latching at the breast.   LC reviewed LPTI supplementation guideline and infant not able to latch volume 30 ml or more.  We worked on latching at breast, STS, infant tongue sucking with high palate and thick labial attachment. We attempted latch in cross cradle with laid back but nipples are large even in this position infant pops on and off.   Plan 1. Mom to feed based on cues 8-12x in 24 hr period no more than 3 hrs without an attempt. Mom to offer breasts every other feed work to increase volume since he tends to tire out.  2. Mom to supplement with EBM first and if needed DBM. Mom pumping volume increased getting 20 ml or more per pumping session as her milk coming in.  3. Mom post pump q 3 hrs for 20 min on maintenance on DEBP or using her tommie tippe.  All questions answered at the end of the visit.   Maternal Data Has patient been taught Hand Expression?: Yes  Feeding Mother's Current Feeding Choice: Breast Milk and Donor Milk Nipple Type: Extra Slow Flow  LATCH Score                    Lactation Tools Discussed/Used Tools: Pump;Flanges Flange Size: 27 Breast pump type: Double-Electric Breast Pump;Other (comment) (Mom switched  to using her tomme tippe pump with 28 flange) Pump Education: Setup, frequency, and cleaning;Milk Storage Reason for Pumping: increase stimulation Pumping frequency: every 3 hrs for 20 min on maintenance  Interventions Interventions: Breast feeding basics reviewed;Support pillows;Education;Assisted with latch;Position options;Skin to skin;Expressed milk;Breast massage;Hand express;DEBP;Breast compression;Adjust position  Discharge Pump: Personal  Consult Status Consult Status: Follow-up Date: 07/25/20 Follow-up type: In-patient    Carol Blair  Carol Blair 07/24/2020, 5:54 PM

## 2020-07-24 NOTE — Lactation Note (Signed)
This note was copied from a baby's chart. Lactation Consultation Note Baby sleeping on mom's chest. Mom states BF going well as long as baby is awaken good for feeding but if he gets sleepy he gets on the tip of her nipples and it hurts. Mom is breast/formula feeding. Asked mom if she needs assistance in latching. Mom stated not right now. Encouraged mom to call for assistance. Encouraged to BF before giving formula.  Patient Name: Carol Blair [id number] Date: 07/24/2020 Reason for consult: Follow-up assessment Age:67 hours  Maternal Data    Feeding Mother's Current Feeding Choice: Breast Milk and Formula Nipple Type: Slow - flow  LATCH Score                    Lactation Tools Discussed/Used    Interventions    Discharge    Consult Status Consult Status: Follow-up Date: 07/24/20 Follow-up type: In-patient    [name] 07/24/2020, 4:29 AM

## 2020-07-25 ENCOUNTER — Ambulatory Visit: Payer: Self-pay

## 2020-07-25 NOTE — Lactation Note (Signed)
This note was copied from Carol baby's chart. Lactation Consultation Note  Patient Name: Carol Blair [id number]'Y Date: 07/25/2020 Reason for consult: Follow-up assessment;Hyperbilirubinemia;Early term 37-38.6wks Age:31 hours  LC in to room for follow up. Mother states infant has been feeding well mostly breast milk via bottle. Discussed pacing while bottle-feeding EBM, demonstrated technique, upright position and frequent burping. Infant is receiving phototherapy and swaddled during this LC visit. Mother reports pumping with her personal pump and collecting ~18mL of EBM. Praised mother for milk supply. Talked about lactogenesis II and encouraged to empty breast when pumping.   Feeding plan:  1. Breastfeed following hunger cues.  2. Keep infant awake during breastfeeding session: massaging breast, infant's hand/shoulder/feet 3. Offer breast 8 - 12 times in 24h period to establish good milk supply.   4. Pump or hand-express and offer EBM following guidelines, paced bottle feeding and fullness cues.   5. Monitor voids and stools as signs good intake 6. Encouraged maternal rest, hydration and food intake.  7. Contact Lactation Services or local resources for support, questions or concerns.    All questions answered at this time.    Feeding Mother's Current Feeding Choice: Breast Milk and Donor Milk  Lactation Tools Discussed/Used Tools: Pump (Mother is using her personal pump) Reason for Pumping: stimulation and supplementation Pumping frequency: q3 Pumped volume: 70 mL (last pumping session)  Interventions Interventions: Education;Expressed milk;DEBP;Hand pump;Breast feeding basics reviewed  Discharge Discharge Education: Engorgement and breast care Pump: Personal;DEBP  Consult Status Consult Status: Follow-up Date: 07/26/20 Follow-up type: In-patient    Carol Blair Carol Blair Carol Blair 07/25/2020, 4:55 PM

## 2020-07-26 ENCOUNTER — Ambulatory Visit: Payer: Self-pay

## 2020-07-26 NOTE — Lactation Note (Signed)
This note was copied from a baby's chart. Lactation Consultation Note  Patient Name: Carol Blair [id number] Date: 07/26/2020 Reason for consult: Follow-up assessment;1st time breastfeeding;Early term 37-38.6wks Age:30 years  P4 mother whose infant is now 49 hours old.  This is an ETI at 37+2 weeks.  Mother did not breast feed any of her other children.  Baby has been under phototherapy with the biliblanket.  This morning's bilirubin level was 11.5 mg/dl at 83 hours of life (low intermediate risk zone).  Mother has been breast feeding but primarily supplementing with her breast milk at this time.  Prior to obtaining her own supply, mother was using donor breast milk.  Baby was asleep under phototherapy when I arrived.  Reviewed feeding plan and encouraged increased volumes to 30+ mls every three hours or sooner if baby desires.  Suggested mother continue to latch and breast feed prior to supplementation.  Mother stated the her son has been actively awakening for feedings sooner than every three hours.  Baby has had multiple voids/stools.  Mother has been using her own personal pump and is able to obtain 70+ mls/pumping session.  She no longer has to use donor milk since her supply is so plentiful.  Praised mother for her efforts with pumping.  Reviewed bilirubin level and feeding plan for home.  Engorgement prevention/treatment discussed.  Mother has our OP phone number for any questions after discharge.  Family present.    Maternal Data Has patient been taught Hand Expression?: Yes Does the patient have breastfeeding experience prior to this delivery?: No  Feeding Mother's Current Feeding Choice: Breast Milk and Donor Milk Nipple Type: Extra Slow Flow  LATCH Score                    Lactation Tools Discussed/Used Breast pump type: Double-Electric Breast Pump;Manual Reason for Pumping: Stimulation and supplementation for ETI Pumping frequency: Every three hours Pumped volume:   (70+ mls/session)  Interventions Interventions: Education  Discharge Discharge Education: Engorgement and breast care Pump: DEBP;Manual;Personal  Consult Status Consult Status: Complete Date: 07/26/20 Follow-up type: Call as needed    Tarris Delbene R Uchenna Rappaport 07/26/2020, 9:44 AM

## 2020-07-27 ENCOUNTER — Inpatient Hospital Stay (HOSPITAL_COMMUNITY): Payer: Medicaid Other

## 2020-07-29 ENCOUNTER — Other Ambulatory Visit: Payer: Self-pay

## 2020-07-29 ENCOUNTER — Ambulatory Visit (INDEPENDENT_AMBULATORY_CARE_PROVIDER_SITE_OTHER): Payer: Medicaid Other | Admitting: General Practice

## 2020-07-29 VITALS — BP 148/86 | HR 74 | Ht 64.0 in | Wt 266.0 lb

## 2020-07-29 DIAGNOSIS — Z0130 Encounter for examination of blood pressure without abnormal findings: Secondary | ICD-10-CM

## 2020-07-29 NOTE — Progress Notes (Signed)
Patient presents to office today for BP check following SVD on 7/18. Patient has a history of CHTN and was sent home on Nifedipine 60mg , which she has been taking as directed. She denies headaches, dizziness, or blurry vision. Trace edema noted today in legs/feet. BP 143/90 & 148/86. Reviewed blood pressures with Dr who states patient should increase Nifedipine to 90mg  and return in 1 week for repeat BP check. Reviewed with patient- she still has 90mg  at home and will begin that. Advised patient to call [facility] if she experiences headaches, dizziness, or blurry vision. Patient will return in 1 week.  RN BSN 07/29/20

## 2020-08-01 ENCOUNTER — Telehealth (HOSPITAL_COMMUNITY): Payer: Self-pay

## 2020-08-01 NOTE — Telephone Encounter (Signed)
"  I'm ok. I'm fine. Just been concerned about his jaundice. I've been alittle engorged. I'm pumping and bottle feeding. He won't latch well but we are still trying to latch." RN provided Lactation phone number to patient and reviewed breastfeeding/pumping support group. Patient has no other concerns or questions about her healing.  "He has jaundice. We are going daily to the pediatrician for bilirubin checks. He is on phototherapy at home. Whenever we have stopped the lights the level has went back up so we are back on the lights. He is feeding well and having good diapers." RN encouraged patient to continue to keep the phototherapy lights on as her pediatrician has advised her to. Also explained to patient that bilirubin is excreted in the stool so the more baby eats and has dirty diapers the more that jaundice is leaving his body. "Baby is sleeping in a bassinet in my room. He sleeps on his back." Patient has no other questions or concerns about baby.  EPDS score is 2.  Marcelino [facility] 08/01/2020,1902

## 2020-08-02 ENCOUNTER — Encounter: Payer: Medicaid Other | Admitting: Obstetrics and Gynecology

## 2020-08-05 ENCOUNTER — Ambulatory Visit (INDEPENDENT_AMBULATORY_CARE_PROVIDER_SITE_OTHER): Payer: Medicaid Other

## 2020-08-05 ENCOUNTER — Other Ambulatory Visit: Payer: Self-pay

## 2020-08-05 DIAGNOSIS — O10919 Unspecified pre-existing hypertension complicating pregnancy, unspecified trimester: Secondary | ICD-10-CM

## 2020-08-05 NOTE — Progress Notes (Signed)
Patient presents to office today for BP check following SVD on 7/18. Patient has a history of CHTN and was sent home on Nifedipine 60mg , but changed by Dr on 7/25 to 90mg  Procardia. Pt states has been taking 90mg  Procardia as directed. Pt denies any headaches, dizziness, visual changes. Pt advised to return call to office with any worsening symptoms of HTN. Pt verbalized understanding.   BP today 144/95 & 131/86   Pt advised to continue to take BP meds as directed until PP visit on 08/23/20. Pt verbalized understanding.   , RN

## 2020-08-07 NOTE — Progress Notes (Signed)
Patient was evaluated by nursing staff. Agree with assessment and plan.  °

## 2020-08-23 ENCOUNTER — Encounter: Payer: Self-pay | Admitting: Obstetrics and Gynecology

## 2020-08-23 ENCOUNTER — Other Ambulatory Visit: Payer: Self-pay

## 2020-08-23 ENCOUNTER — Other Ambulatory Visit (HOSPITAL_COMMUNITY)
Admission: RE | Admit: 2020-08-23 | Discharge: 2020-08-23 | Disposition: A | Discharge status: Home / Self Care | Payer: Medicaid Other | Source: Ambulatory Visit | Attending: Obstetrics and Gynecology | Admitting: Obstetrics and Gynecology

## 2020-08-23 ENCOUNTER — Ambulatory Visit (INDEPENDENT_AMBULATORY_CARE_PROVIDER_SITE_OTHER): Payer: Medicaid Other | Admitting: Obstetrics and Gynecology

## 2020-08-23 VITALS — BP 127/75 | HR 90 | Ht 64.0 in | Wt 263.2 lb

## 2020-08-23 DIAGNOSIS — Z3042 Encounter for surveillance of injectable contraceptive: Secondary | ICD-10-CM

## 2020-08-23 DIAGNOSIS — O10919 Unspecified pre-existing hypertension complicating pregnancy, unspecified trimester: Secondary | ICD-10-CM | POA: Diagnosis not present

## 2020-08-23 DIAGNOSIS — N879 Dysplasia of cervix uteri, unspecified: Secondary | ICD-10-CM | POA: Diagnosis not present

## 2020-08-23 DIAGNOSIS — Z3202 Encounter for pregnancy test, result negative: Secondary | ICD-10-CM | POA: Diagnosis not present

## 2020-08-23 LAB — POCT PREGNANCY, URINE: Preg Test, Ur: NEGATIVE

## 2020-08-23 MED ORDER — NIFEDIPINE ER 90 MG PO TB24: 90.0000 mg | ORAL_TABLET | Freq: Every day | ORAL | 3 refills | Status: DC

## 2020-08-23 MED ORDER — MEDROXYPROGESTERONE ACETATE 150 MG/ML IM SUSP
150.0000 mg | Freq: Once | INTRAMUSCULAR | Status: AC
  Administered 2020-08-23: 150 mg via INTRAMUSCULAR

## 2020-08-23 NOTE — Patient Instructions (Signed)
Call your family medicine clinic for a physical exam in a month or two.

## 2020-08-23 NOTE — Progress Notes (Signed)
    Post Partum Visit Note  Carol Blair is a 30 y.o. [id number] s/p 7/18 SVD/intact perineum at 37wks. Pt admitted for IOL for worsening cHTN.  Anesthesia: none. Postpartum course has been c/b titrating her BP meds. Baby is doing well. Baby is feeding by breast. Bleeding no bleeding. Bowel function is normal. Bladder function is normal. Patient is sexually active. Contraception method is condoms. Postpartum depression screening: negative.   Carol pregnancy intention screening data noted above was reviewed. Potential methods of contraception were discussed. Carol patient elected to proceed with No data recorded.   Edinburgh Postnatal Depression Scale - 08/23/20 1015       Edinburgh Postnatal Depression Scale:  In Carol Past 7 Days   I have been able to laugh and see Carol funny side of things. 0    I have looked forward with enjoyment to things. 0    I have blamed myself unnecessarily when things went wrong. 0    I have been anxious or worried for no good reason. 0    I have felt scared or panicky for no good reason. 0    Things have been getting on top of me. 1    I have been so unhappy that I have had difficulty sleeping. 0    I have felt sad or miserable. 0    I have been so unhappy that I have been crying. 0    Carol thought of harming myself has occurred to me. 0    Edinburgh Postnatal Depression Scale Total 1             Health Maintenance Due  Topic Date Due   INFLUENZA VACCINE  08/05/2020     Review of Systems Pertinent items are noted in HPI.  Objective:  BP 127/75   Pulse 90   Ht 5\' 4"  (1.626 m)   Wt 263 lb 3.2 oz (119.4 kg)   LMP  (LMP Unknown)   Breastfeeding Yes   BMI 45.18 kg/m    NAD EGBUS normal Perineum normal Vagina normal Cervix see colpo note Uterus small, nttp Adenxa negative Assessment:    [name] postpartum exam.   Plan:   *PP: routine care. Depo provera *Cervical dysplasia: f/u colpo results *CHTN: pt told to contact PCP at [facility] but  to stay on Carol procardia b/c it is working well and refill was sent in; pt not on meds pre pregnancy. I told her that sometimes it can take 12 weeks for pregnancy HTN to resolve but sometimes pregnancy can make cHTN worse to where she may now need to be on meds outside of pregnancy.   RTC 3 months for rpt depo  [facility], MD [facility] for [facility], [facility]

## 2020-08-26 LAB — SURGICAL PATHOLOGY

## 2020-08-27 ENCOUNTER — Encounter: Payer: Self-pay | Admitting: Obstetrics and Gynecology

## 2020-08-27 HISTORY — PX: COLPOSCOPY: PRO47

## 2020-08-27 NOTE — Procedures (Signed)
Colposcopy Procedure Note  Pre-operative Diagnosis: 01/31/2020 pregnancy pap: HSIL, HPV+. Atypical glands present which may indicate possible gland involvement.  03/15/2020 colposcopy (inadequate): no biopsies taken.  07/2018 pap: negative pap/+HPV, negative 16/18/45 12/2016 colpo (adeuqate): 12 and 6 o'clock biopsies c/w CIN 1, negative ECC 12/2016 pap: HSIL/HPV+ 02/2016 pap: LSIL with a few cells suggestive of a higher grade lesion. 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   The risks (including infection, bleeding, pain) and benefits of the procedure were explained to the patient and written informed consent was obtained.  The patient was placed in the dorsal lithotomy position. A Graves was speculum inserted in the vagina, and the cervix was visualized.  Acetic acid staining was done and the cervix was viewed with green filter; lugol's staining with green filter was also done.  Random biopsies from 12, 8 and 4 o'clock taken and then single toothed tenaculum applied and endocervical curettage in all four quadrants done. There was no bleeding after procedure.   Findings: diffuse awe changes  Adequate: Yes  Specimens: 12, 8 and 4 o'clock cervix biopsies. Endocervical curettage.   Condition: Stable  Complications: None  Plan: The patient was advised to call for any fever or for prolonged or severe pain or bleeding. She was advised to use OTC analgesics as needed for mild to moderate pain. She was advised to avoid vaginal intercourse for 48 hours or until the bleeding has completely stopped.   Cornelia Copa MD Attending Center for [employer] [facility])

## 2020-09-19 ENCOUNTER — Telehealth: Payer: Self-pay | Admitting: Family Medicine

## 2020-09-19 NOTE — Telephone Encounter (Signed)
Returned patients call.   Patient reports she had a cold last week. She reports infant does not take the breast often but will take it some.   Mom reports her milk supply decreased since the cold and getting 1/2-1 ounces. She tries to latch each feeding before pumping, he latches about 2 x a day. She would like to increase her milk supply back up.   Mom pumps on infants schedule. She has been power pumping with some increase in supply. She is needed to supplement infant with formula. She is pumping around the clock. She will pump every 2-4 hours.  She is drinking 4 bottles of water a day, she has Tour manager to try.   She was taking Theraflu and is not sure of it contained an antihistamine, she is no longer taking.   Reviewed not taking products with antihistamines while breast feeding.   Reviewed increasing water intake and adding Body Armor.   Reviewed Fenugreek, Moringa, Legendairy Milk Fenugreek Free Products and Reglan. Reviewed where to obtain, dosages and side effects.   Patient to call with any further questions or concerns.

## 2020-09-19 NOTE — Telephone Encounter (Signed)
Patient called and want to speak with [facility], state she had a "Cold" and now she can't get her milk supply up

## 2020-10-05 ENCOUNTER — Encounter: Payer: Self-pay | Admitting: Radiology

## 2020-11-13 ENCOUNTER — Ambulatory Visit (INDEPENDENT_AMBULATORY_CARE_PROVIDER_SITE_OTHER): Payer: Medicaid Other

## 2020-11-13 ENCOUNTER — Other Ambulatory Visit: Payer: Self-pay

## 2020-11-13 VITALS — BP 149/82 | HR 71 | Ht 64.0 in | Wt 275.5 lb

## 2020-11-13 DIAGNOSIS — Z3042 Encounter for surveillance of injectable contraceptive: Secondary | ICD-10-CM | POA: Diagnosis not present

## 2020-11-13 MED ORDER — MEDROXYPROGESTERONE ACETATE 150 MG/ML IM SUSP
150.0000 mg | Freq: Once | INTRAMUSCULAR | Status: AC
  Administered 2020-11-13: 150 mg via INTRAMUSCULAR

## 2020-11-13 NOTE — Progress Notes (Signed)
Carol Blair here for Depo-Provera Injection. Injection administered without complication. Patient will return in 3 months for next injection between Jan 25 and Feb 8,2023. Next annual visit due August 2023.  Pt states husband having a vasectomy next month.   Carol Jarvis, RN 11/13/2020  9:49 AM

## 2020-11-13 NOTE — Progress Notes (Signed)
Chart reviewed for nurse visit. Agree with plan of care.   Venora Maples, MD 11/13/20 10:41 AM

## 2021-01-29 ENCOUNTER — Other Ambulatory Visit: Payer: Self-pay

## 2021-01-29 ENCOUNTER — Ambulatory Visit (INDEPENDENT_AMBULATORY_CARE_PROVIDER_SITE_OTHER): Payer: Medicaid Other

## 2021-01-29 VITALS — BP 147/98 | HR 68 | Wt 288.7 lb

## 2021-01-29 DIAGNOSIS — Z3042 Encounter for surveillance of injectable contraceptive: Secondary | ICD-10-CM

## 2021-01-29 MED ORDER — MEDROXYPROGESTERONE ACETATE 150 MG/ML IM SUSP
150.0000 mg | Freq: Once | INTRAMUSCULAR | Status: AC
  Administered 2021-01-29: 11:00:00 150 mg via INTRAMUSCULAR

## 2021-01-29 NOTE — Progress Notes (Signed)
Carol Blair here for Depo-Provera Injection. Injection administered without complication. Patient will return in 3 months for next injection between April 12 and April 26. Front office will schedule annual visit with next available appt.    Ralene Bathe, RN 01/29/2021  9:59 AM

## 2021-04-17 ENCOUNTER — Ambulatory Visit: Payer: Medicaid Other

## 2021-04-17 ENCOUNTER — Ambulatory Visit: Payer: Medicaid Other | Admitting: Student

## 2021-04-20 ENCOUNTER — Other Ambulatory Visit: Payer: Self-pay | Admitting: Obstetrics and Gynecology

## 2021-04-20 DIAGNOSIS — O10919 Unspecified pre-existing hypertension complicating pregnancy, unspecified trimester: Secondary | ICD-10-CM

## 2021-04-23 ENCOUNTER — Other Ambulatory Visit: Payer: Self-pay | Admitting: Obstetrics and Gynecology

## 2021-04-23 ENCOUNTER — Other Ambulatory Visit: Payer: Self-pay | Admitting: *Deleted

## 2021-04-23 ENCOUNTER — Encounter: Payer: Self-pay | Admitting: *Deleted

## 2021-04-23 DIAGNOSIS — I10 Essential (primary) hypertension: Secondary | ICD-10-CM

## 2021-04-23 DIAGNOSIS — O10919 Unspecified pre-existing hypertension complicating pregnancy, unspecified trimester: Secondary | ICD-10-CM

## 2021-04-23 MED ORDER — NIFEDIPINE ER 90 MG PO TB24: 90.0000 mg | ORAL_TABLET | Freq: Every day | ORAL | 1 refills | Status: DC

## 2021-06-10 ENCOUNTER — Encounter: Payer: Self-pay | Admitting: *Deleted

## 2021-07-14 IMAGING — US US OB < 14 WEEKS - US OB TV
1 series · 15 of 28 positions shown · non-contrast
Comparison: None.

CLINICAL DATA: Vaginal bleeding for 3 days. Positive pregnancy
test.

EXAM:
OBSTETRIC <14 WK US AND TRANSVAGINAL OB US
TECHNIQUE: Both transabdominal and transvaginal ultrasound examinations were
performed for complete evaluation of the gestation as well as the
maternal uterus, adnexal regions, and pelvic cul-de-sac.
Transvaginal technique was performed to assess early pregnancy.

[Series 1: us ob < 14 weeks - us ob tv · 15 of 107 slices shown]
[im 1/107]
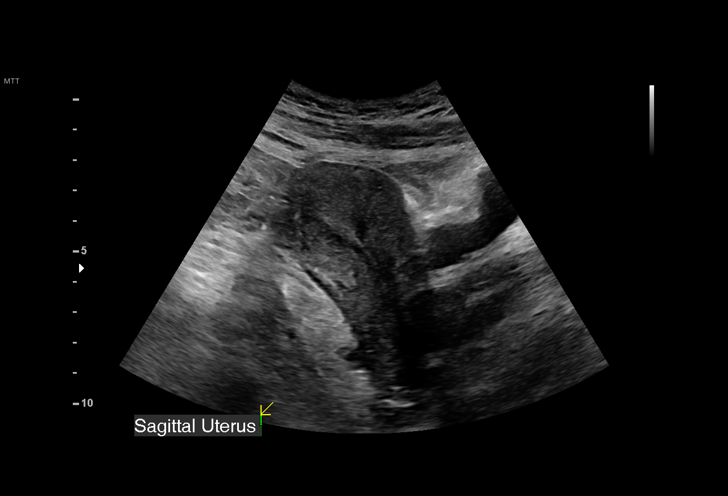
[im 8/107]
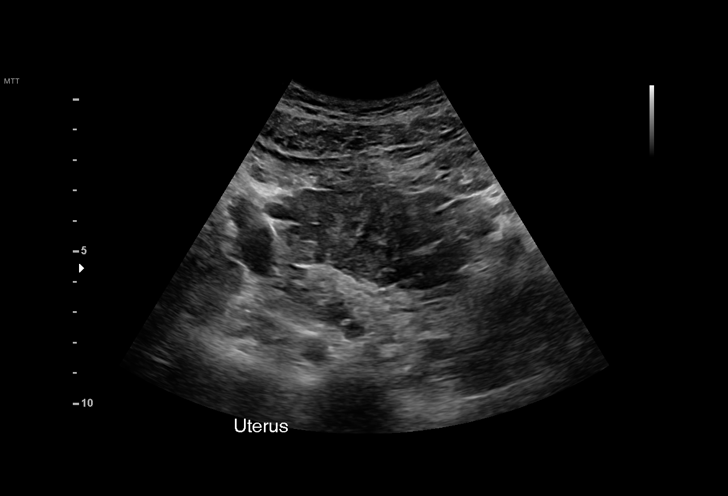
[im 16/107]
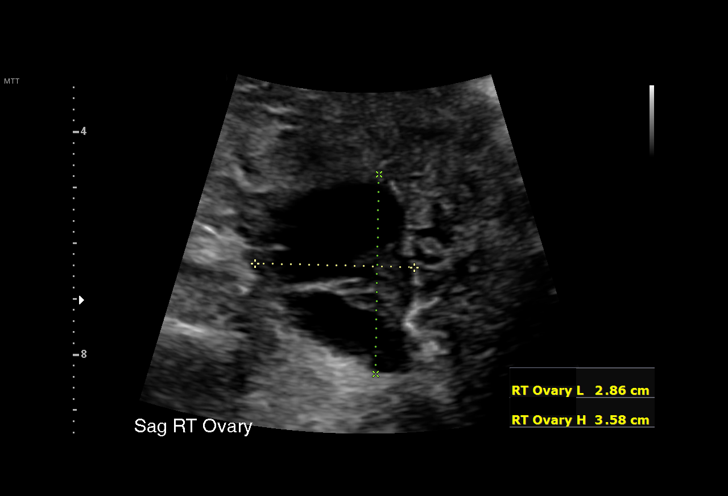
[im 24/107]
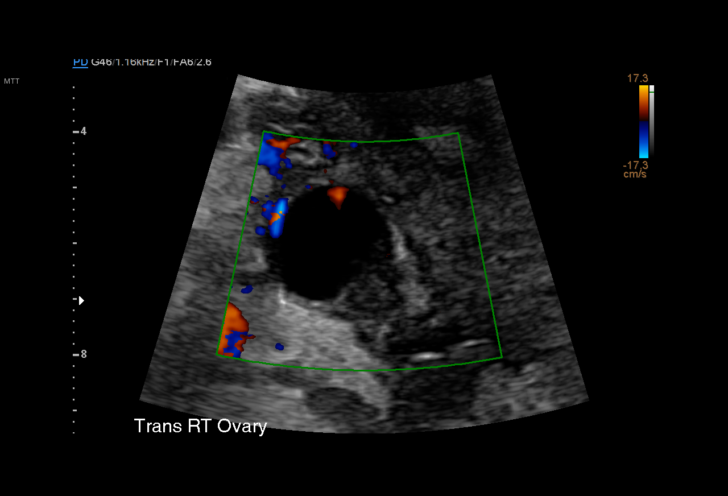
[im 32/107]
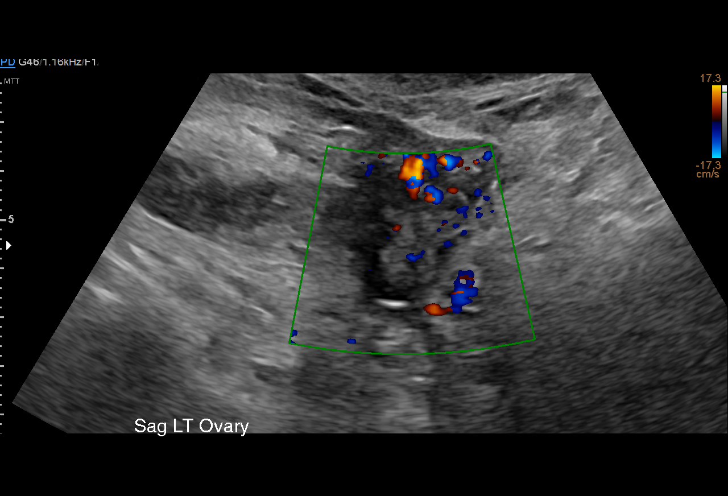
[im 40/107]
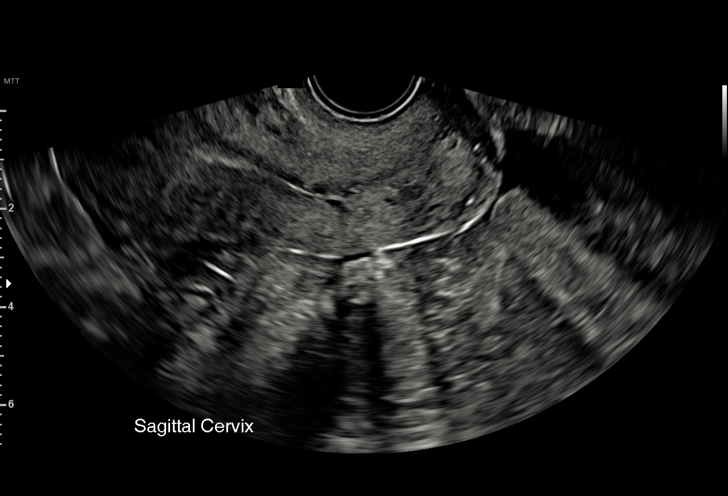
[im 48/107]
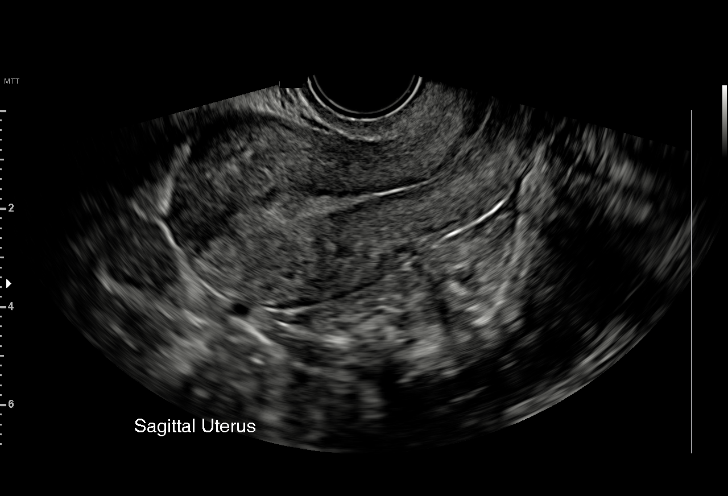
[im 55/107]
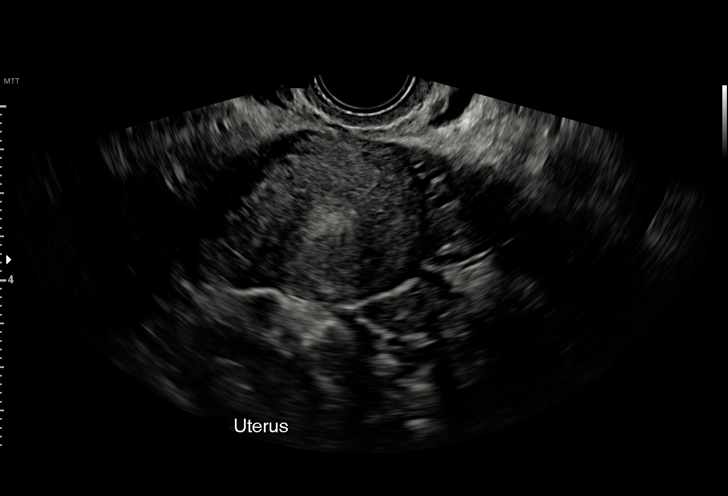
[im 59/107]
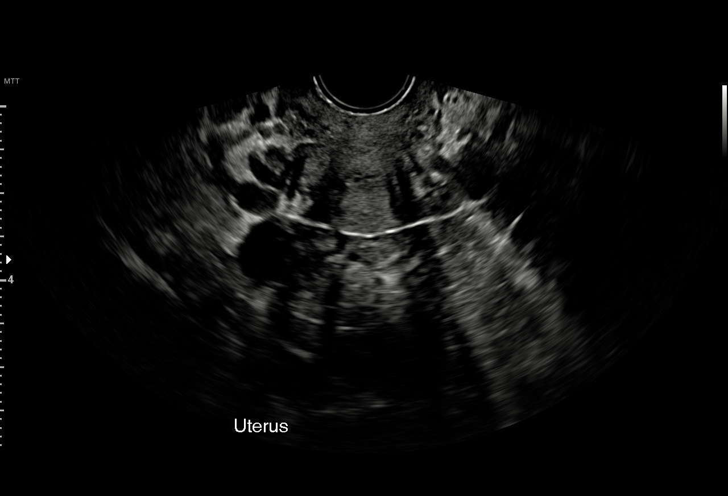
[im 67/107]
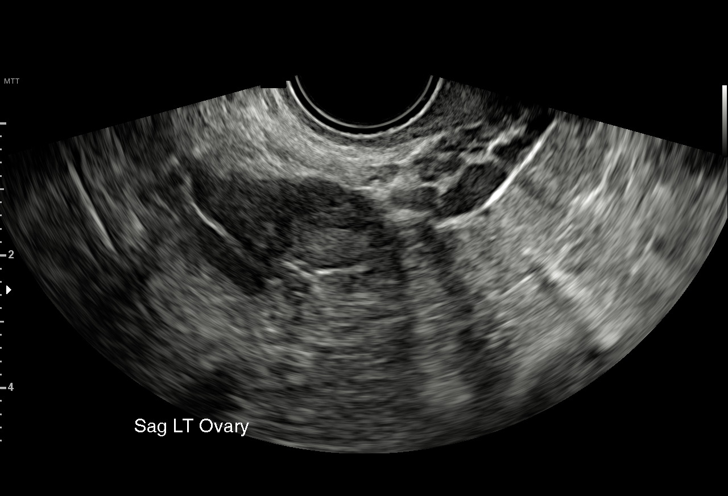
[im 75/107]
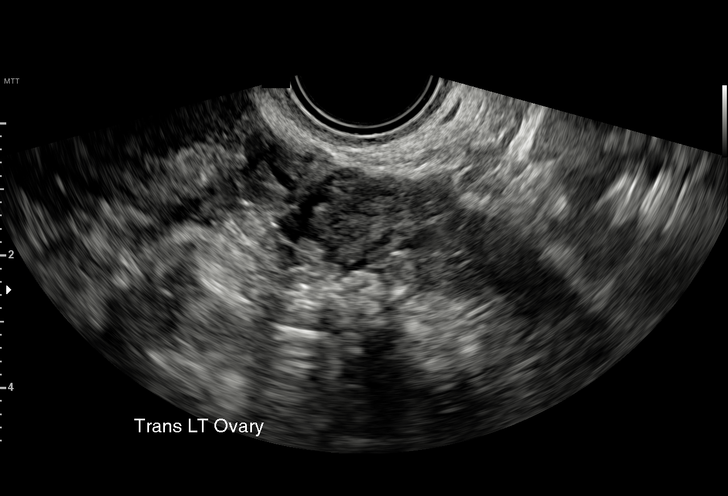
[im 83/107]
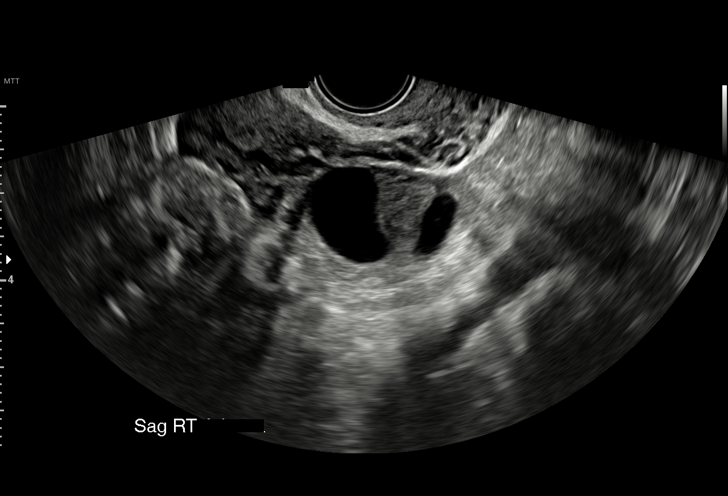
[im 91/107]
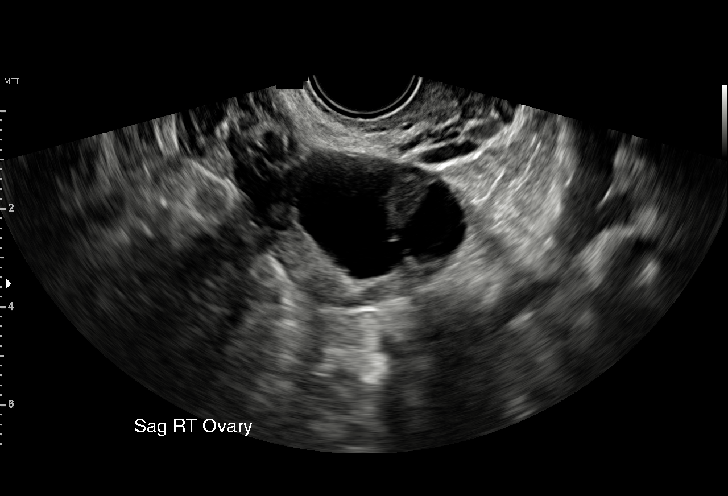
[im 99/107]
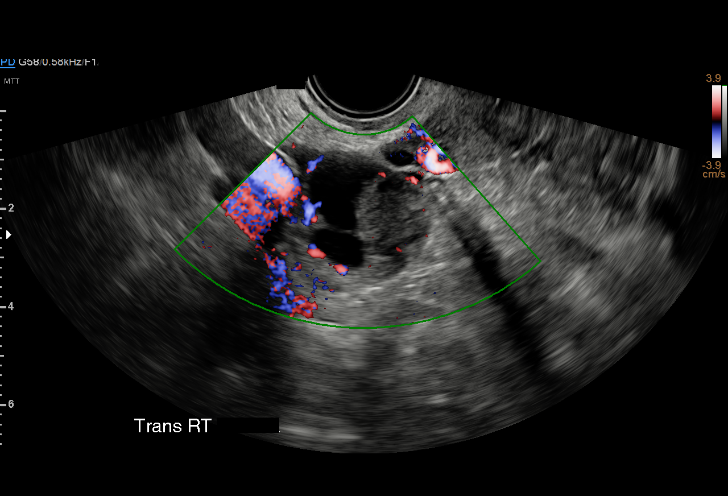
[im 107/107]
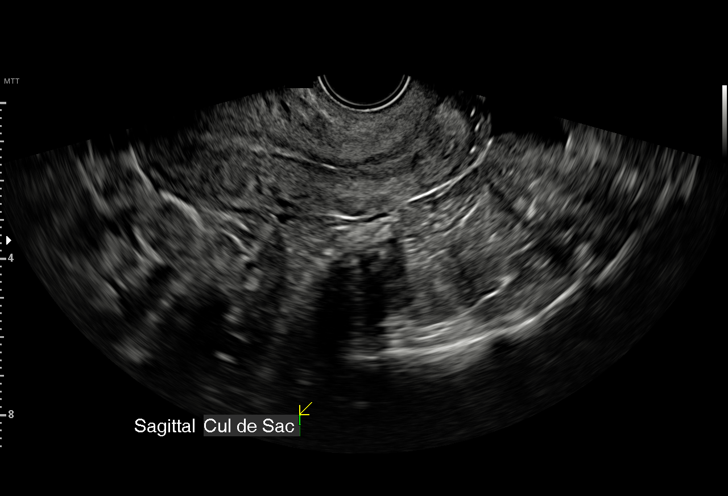

[15 of 28 positions shown; findings below may reference images not displayed]

FINDINGS: Intrauterine gestational sac: None

Maternal uterus/adnexae: No fibroids identified. Probable small
right ovarian corpus luteum noted. Normal appearance left ovary. No
adnexal mass or abnormal free fluid identified.
IMPRESSION: Pregnancy of unknown anatomic location (no intrauterine gestational
sac or adnexal mass identified). Differential diagnosis includes
recent spontaneous abortion, IUP too early to visualize, and
non-visualized ectopic pregnancy. Recommend correlation with serial
beta-hCG levels, and follow up US if warranted clinically.

## 2022-06-01 IMAGING — US US FETAL BPP W/ NON-STRESS
1 series · 13 of 14 positions shown · non-contrast
Comparison: none

[Series 1: us fetal bpp w/ non-stress · 14 acquisitions, 13 frames shown]
[im 1/14]
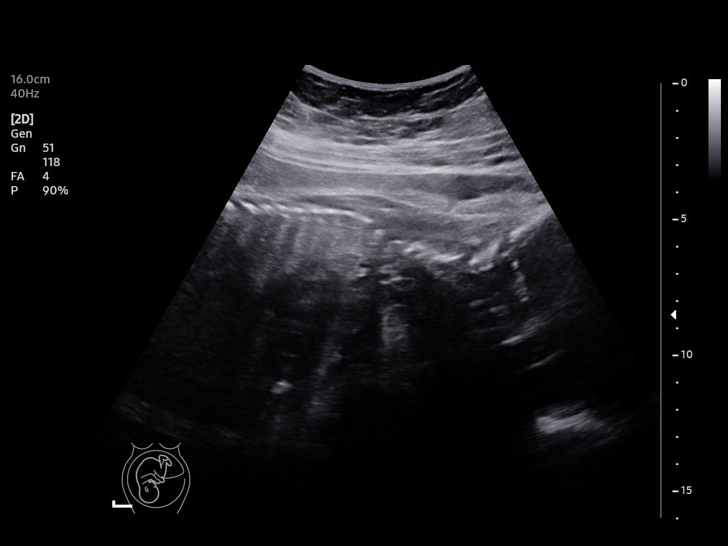
[im 2/14]
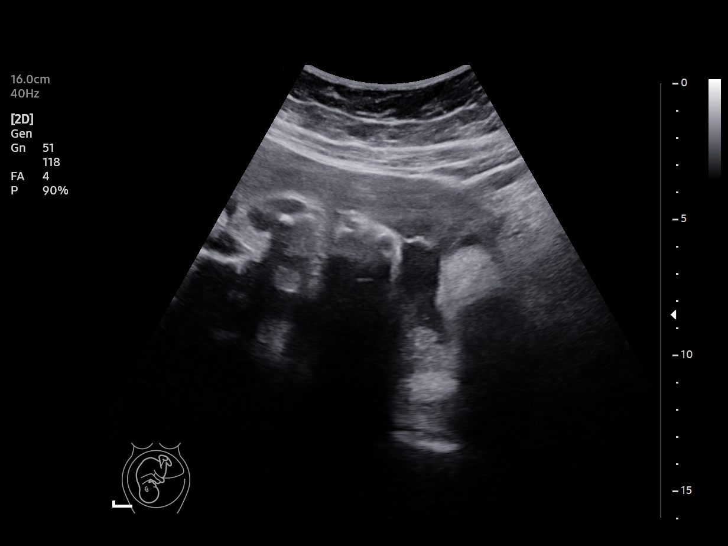
[im 3/14]
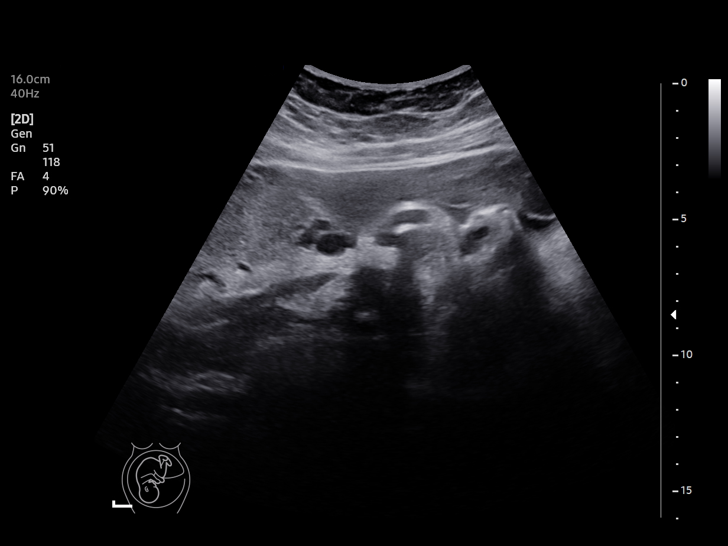
[im 4/14]
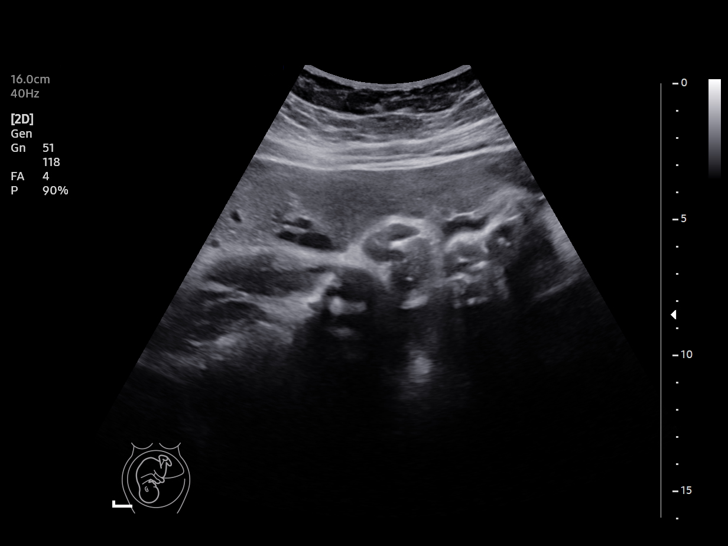
[im 5/14]
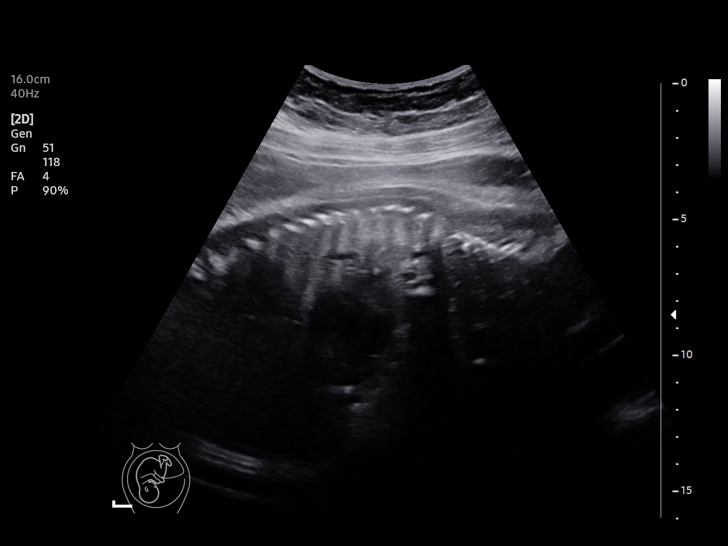
[im 6/14]
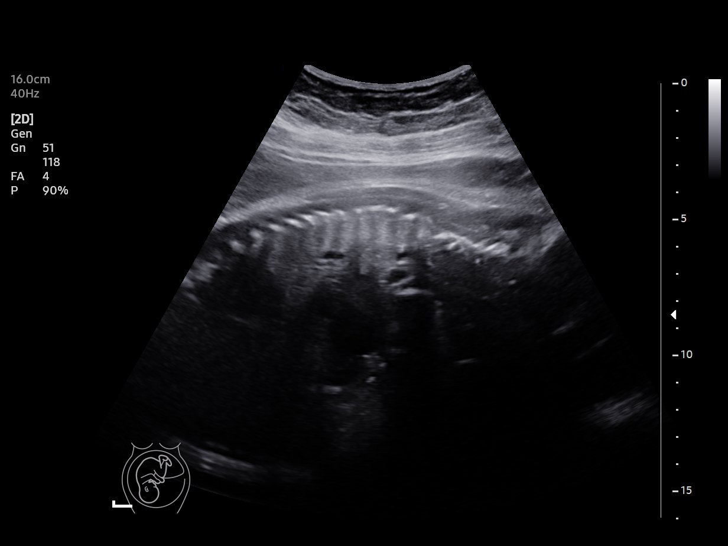
[im 8/14]
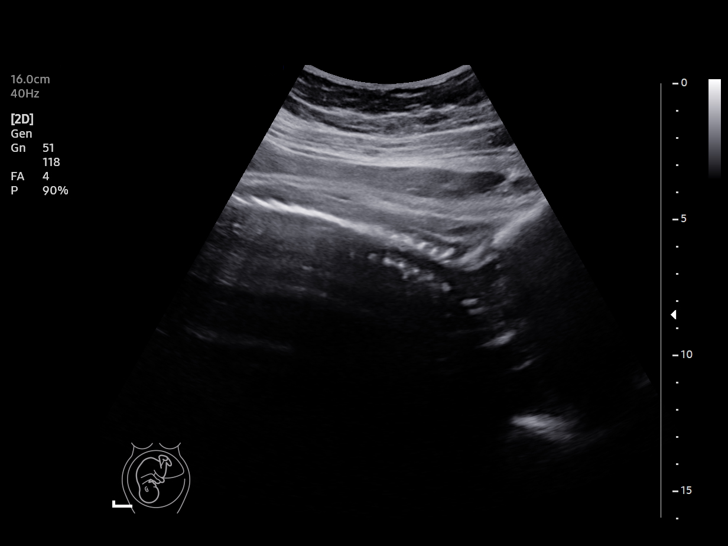
[im 9/14]
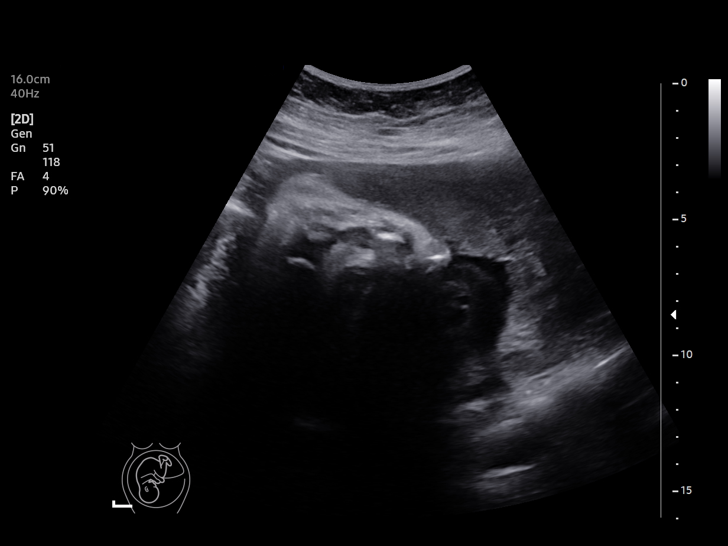
[im 10/14]
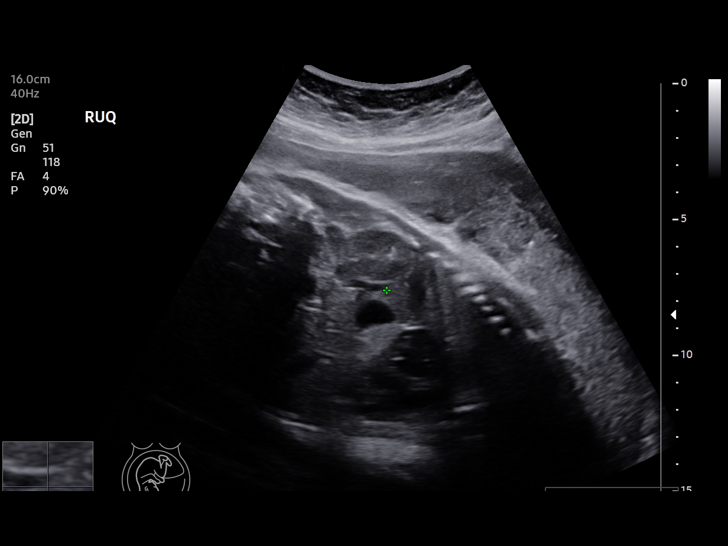
[im 11/14]
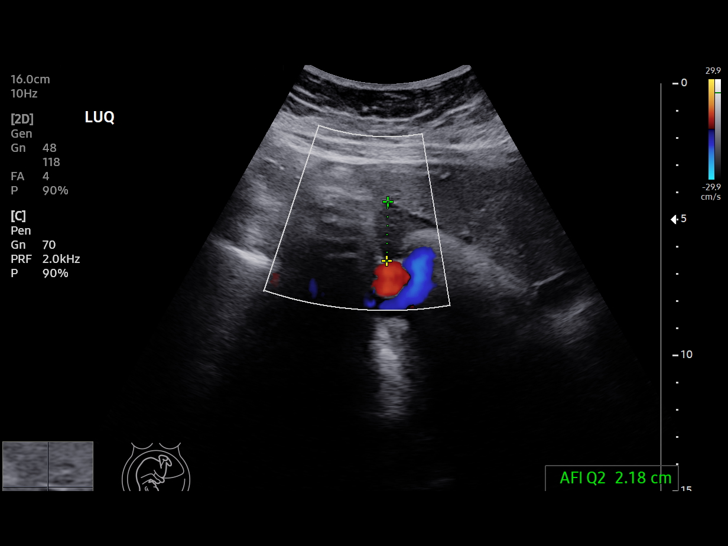
[im 12/14]
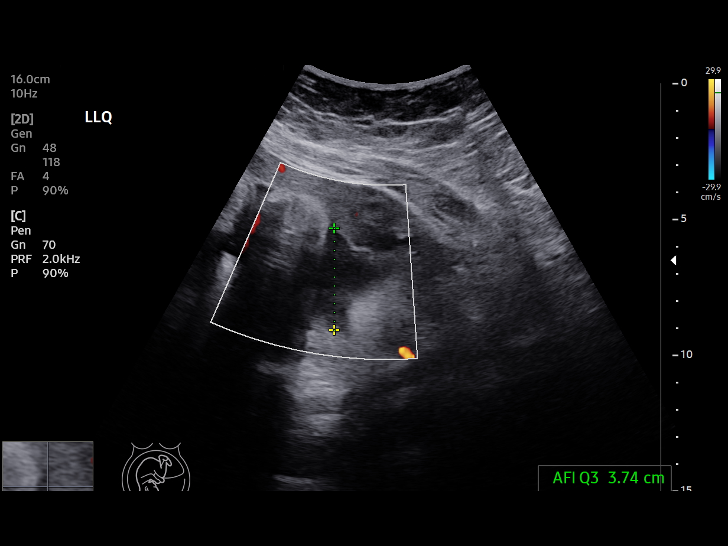
[im 13/14]
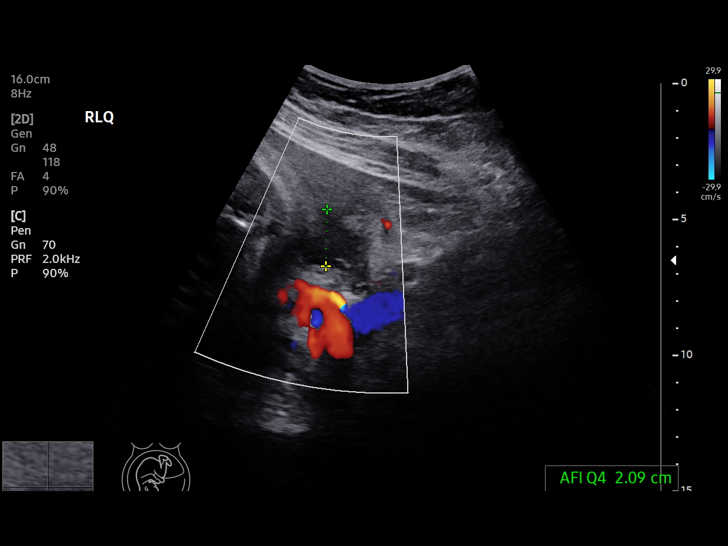
[im 14/14]
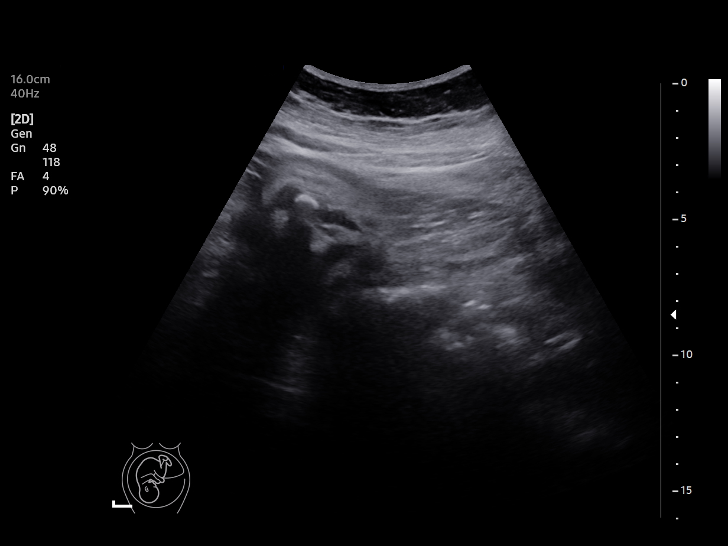

[13 of 14 positions shown; findings below may reference images not displayed]

[REDACTED]
                                                            Healthcare at

 1  US FETAL BPP W/NONSTRESS              76818.4     [facility]

Service(s) Provided

Indications

 36 weeks gestation of pregnancy
 Hypertension - Chronic/Pre-existing
 Obesity complicating pregnancy, third
 trimester
Fetal Evaluation

 Num Of Fetuses:         1
 Preg. Location:         Intrauterine
 Cardiac Activity:       Observed
 Presentation:           Cephalic

 Amniotic Fluid
 AFI FV:      Within normal limits

 AFI Sum(cm)     %Tile       Largest Pocket(cm)
 8               8

 RUQ(cm)       RLQ(cm)       LUQ(cm)        LLQ(cm)
 0
Biophysical Evaluation

 Amniotic F.V:   Pocket => 2 cm             F. Tone:        Observed
 F. Movement:    Observed                   N.S.T:          Reactive
 F. Breathing:   Observed                   Score:          [DATE]
OB History

 Gravidity:    5         Term:   3         SAB:   1
 Living:       3
Gestational Age

 LMP:           36w 2d        Date:  11/04/19                 EDD:   08/10/20
 Best:          36w 2d     Det. By:  LMP  (11/04/19)          EDD:   08/10/20
Impression

 Reassuring testing
Recommendations

 Continue weekly BPPs until delivery.
                DON LOLITO ONACRAM

## 2022-06-08 IMAGING — US US FETAL BPP W/ NON-STRESS
1 series · 13 of 14 positions shown · non-contrast
Comparison: none

[Series 1: us fetal bpp w/ non-stress · 14 acquisitions, 13 frames shown]
[im 1/14]
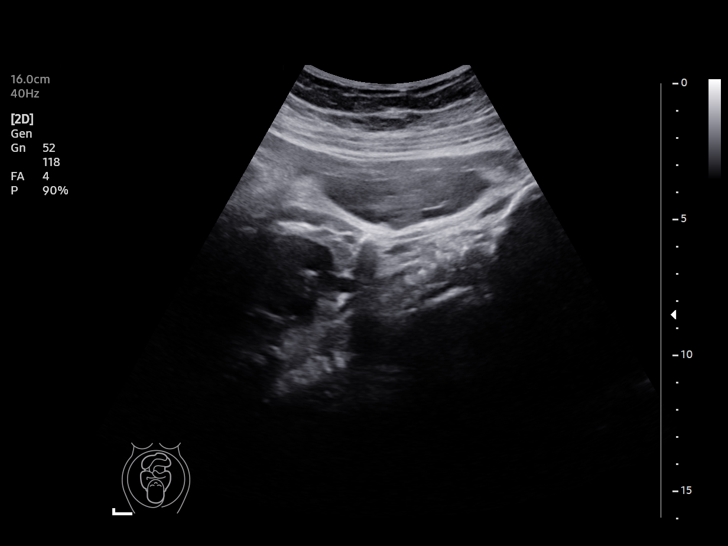
[im 2/14]
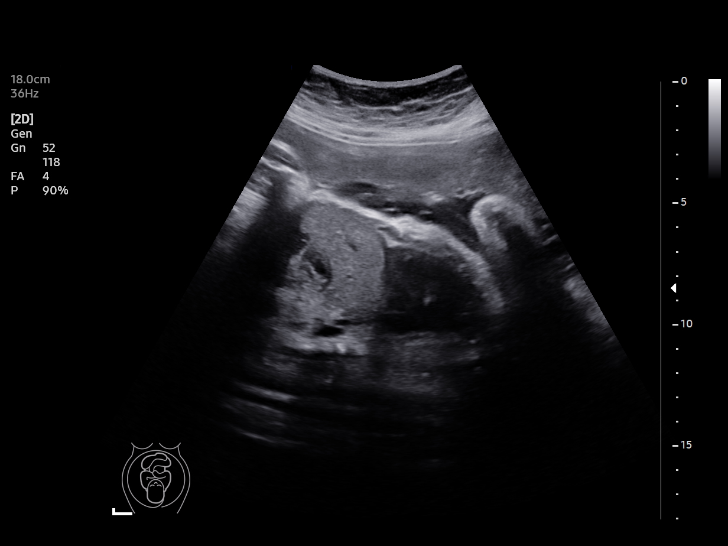
[im 3/14]
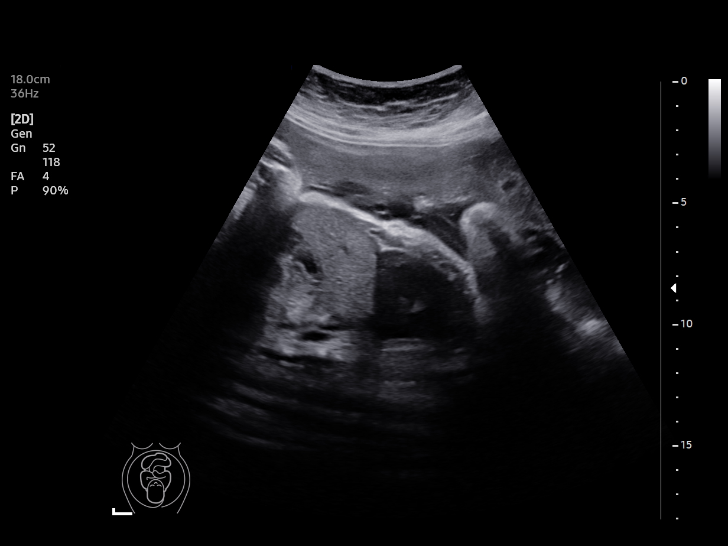
[im 4/14]
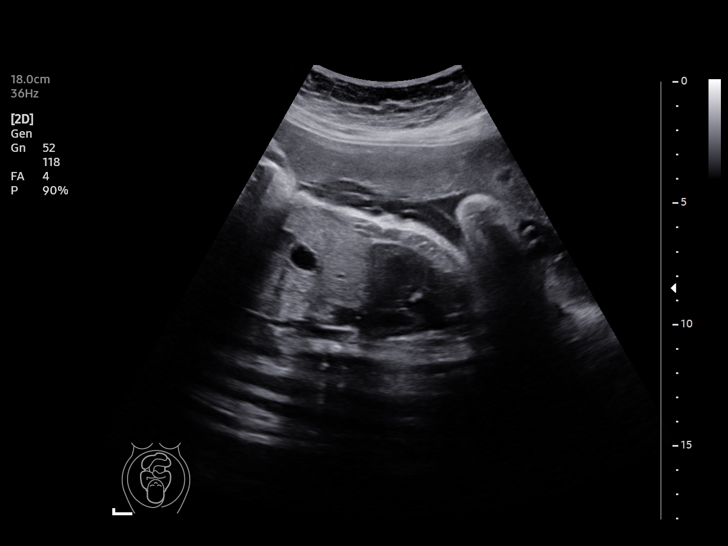
[im 5/14]
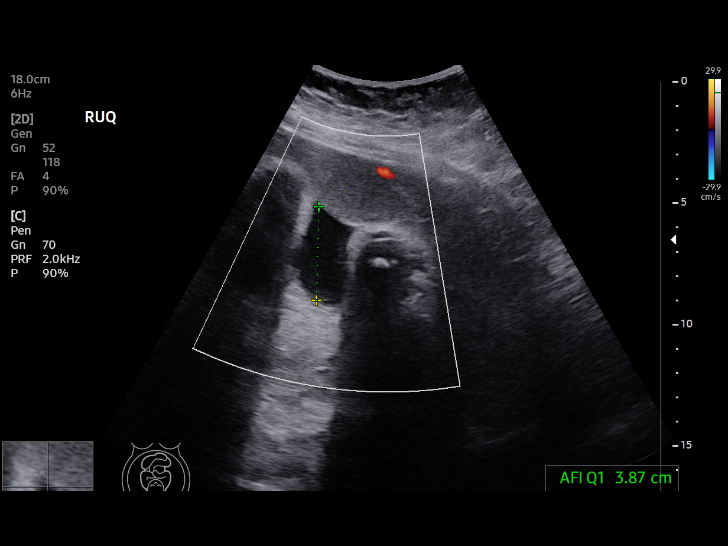
[im 6/14]
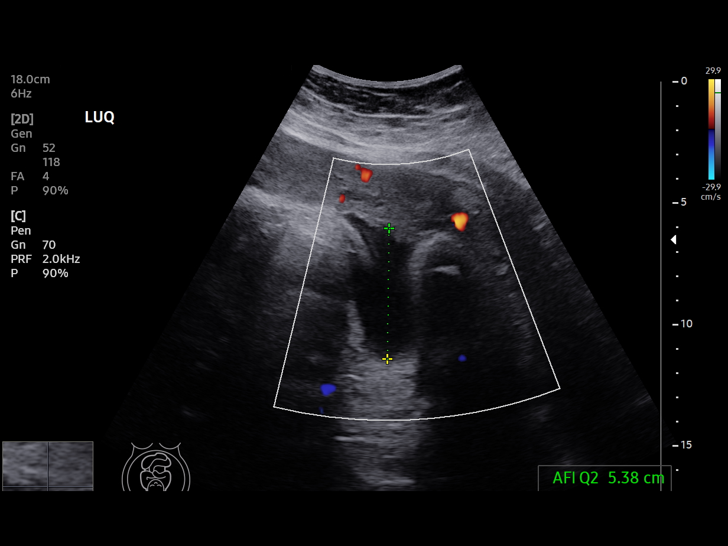
[im 8/14]
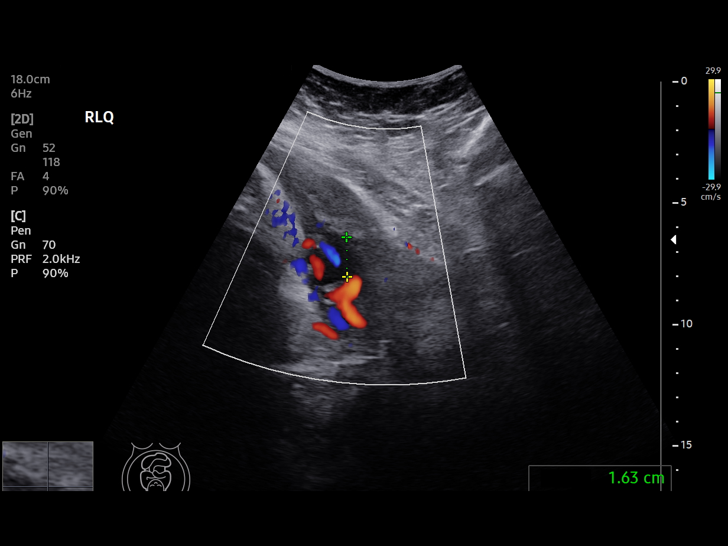
[im 9/14]
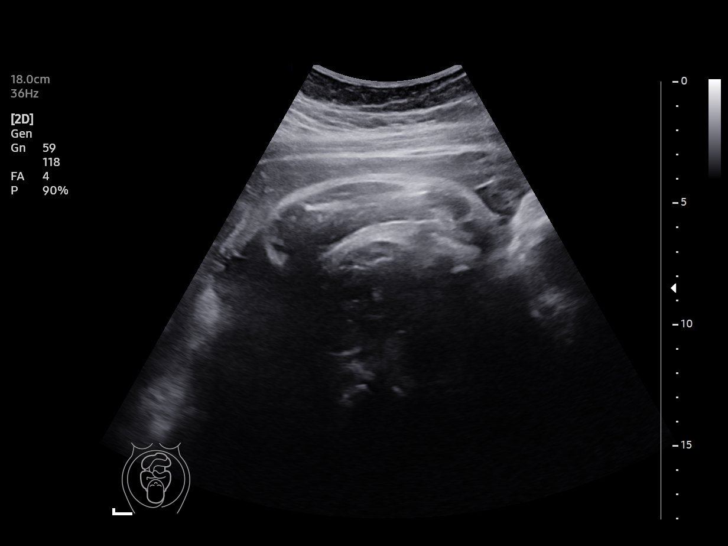
[im 10/14]
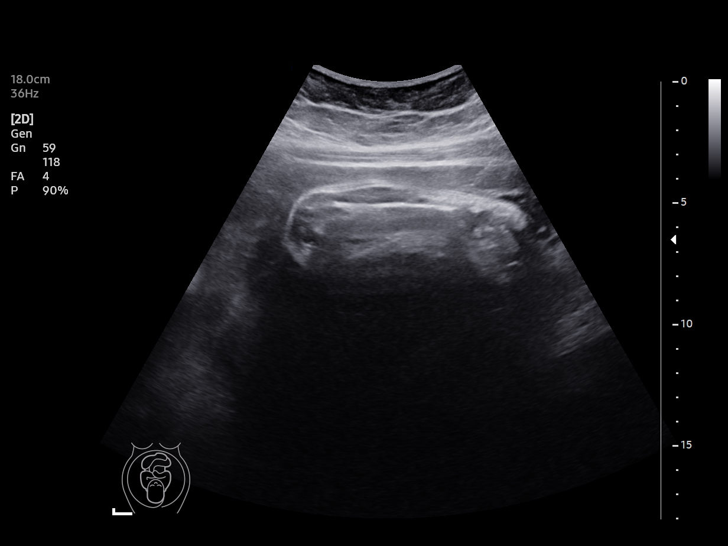
[im 11/14]
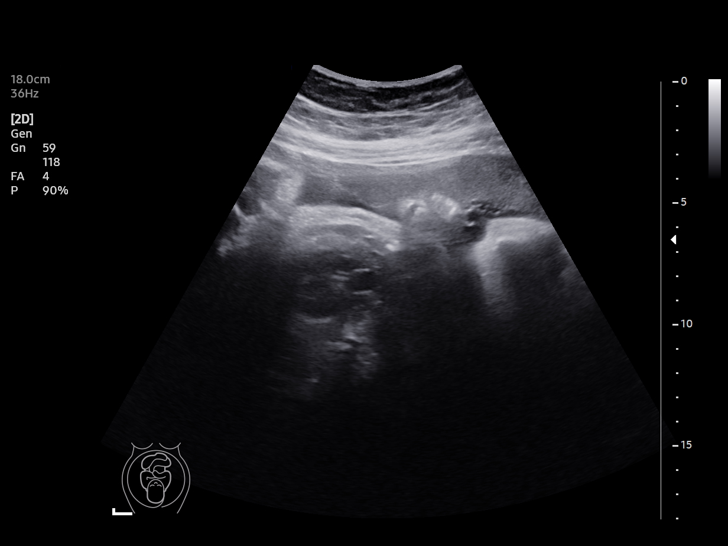
[im 12/14]
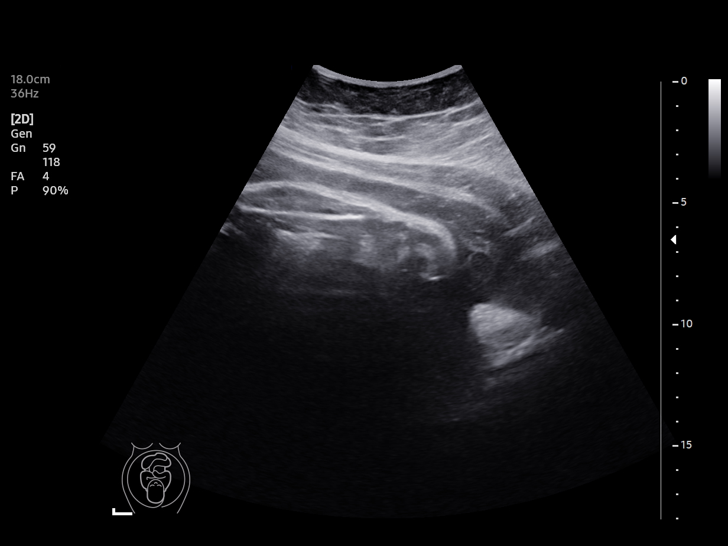
[im 13/14]
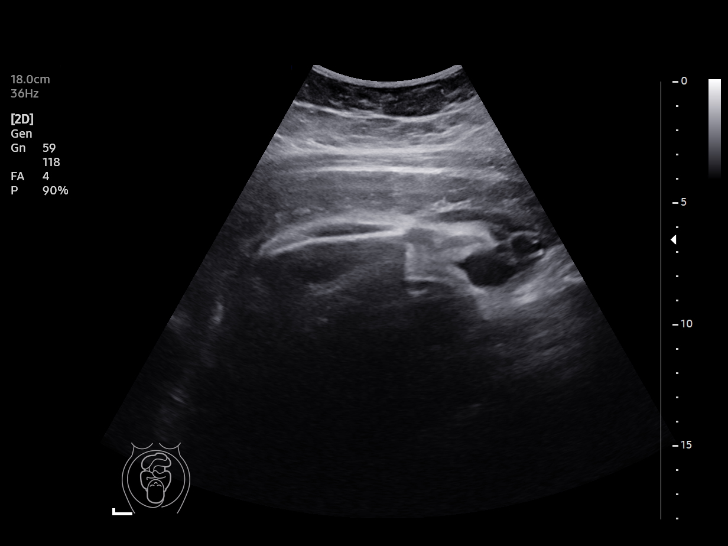
[im 14/14]
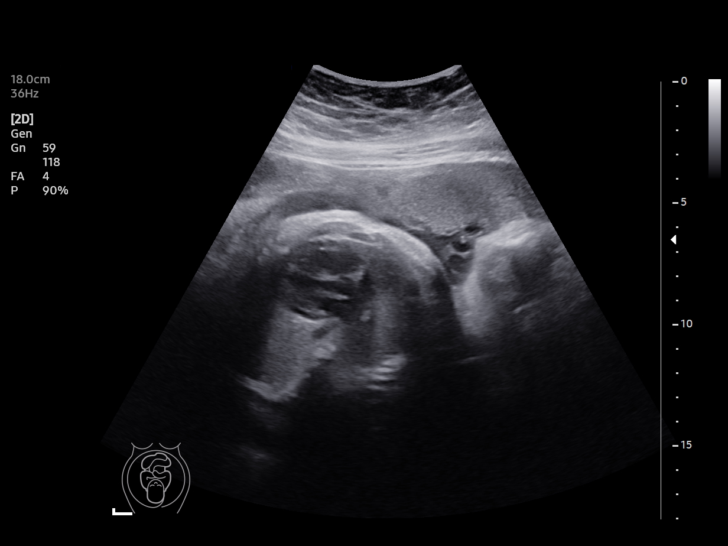

[13 of 14 positions shown; findings below may reference images not displayed]

[REDACTED]
                                                            Healthcare at

 1  US FETAL BPP W/NONSTRESS              76818.4     [facility]

Service(s) Provided

Indications

 37 weeks gestation of pregnancy
 Hypertension - Chronic/Pre-existing
Fetal Evaluation

 Num Of Fetuses:         1
 Preg. Location:         Intrauterine
 Cardiac Activity:       Observed
 Presentation:           Cephalic

 Amniotic Fluid
 AFI FV:      Within normal limits

 AFI Sum(cm)     %Tile       Largest Pocket(cm)
 13.2            48

 RUQ(cm)       RLQ(cm)       LUQ(cm)        LLQ(cm)

Biophysical Evaluation

 Amniotic F.V:   Pocket => 2 cm             F. Tone:        Observed
 F. Movement:    Observed                   N.S.T:          Reactive
 F. Breathing:   Observed                   Score:          [DATE]
OB History
 Gravidity:    5         Term:   3         SAB:   1
 Living:       3
Gestational Age

 LMP:           37w 2d        Date:  11/04/19                 EDD:   08/10/20
 Best:          37w 2d     Det. By:  LMP  (11/04/19)          EDD:   08/10/20
Impression

 Vertex
Recommendations

 Continue with weekly antenatal testing as clinically indicated

## 2023-01-22 ENCOUNTER — Ambulatory Visit: Payer: Medicaid Other | Admitting: Pediatrics

## 2023-03-31 ENCOUNTER — Ambulatory Visit (INDEPENDENT_AMBULATORY_CARE_PROVIDER_SITE_OTHER): Admitting: Student

## 2023-03-31 ENCOUNTER — Other Ambulatory Visit: Payer: Self-pay | Admitting: Student

## 2023-03-31 VITALS — BP 178/115 | HR 81 | Ht 64.0 in | Wt 306.0 lb

## 2023-03-31 DIAGNOSIS — R519 Headache, unspecified: Secondary | ICD-10-CM

## 2023-03-31 DIAGNOSIS — I10 Essential (primary) hypertension: Secondary | ICD-10-CM | POA: Diagnosis not present

## 2023-03-31 DIAGNOSIS — G8929 Other chronic pain: Secondary | ICD-10-CM | POA: Diagnosis not present

## 2023-03-31 DIAGNOSIS — Z6841 Body Mass Index (BMI) 40.0 and over, adult: Secondary | ICD-10-CM | POA: Diagnosis not present

## 2023-03-31 LAB — POCT GLYCOSYLATED HEMOGLOBIN (HGB A1C): Hemoglobin A1C: 5.3 % (ref 4.0–5.6)

## 2023-03-31 MED ORDER — AMLODIPINE-OLMESARTAN 5-20 MG PO TABS: 1.0000 | ORAL_TABLET | Freq: Every day | ORAL | 0 refills | Status: DC

## 2023-03-31 NOTE — Assessment & Plan Note (Signed)
 Weight gain of 30 pounds since bereavement. Current eating habits may slow metabolism. Discussed medications for weight loss, noting risks and benefits. - Discuss dietary changes to include 5-6 small meals a day. - Encourage treadmill exercise. - Consider [facility] to [facility]. - Discuss potential use of medication - keep food diary before next visit

## 2023-03-31 NOTE — Progress Notes (Signed)
    SUBJECTIVE:   CHIEF COMPLAINT / HPI:    Thyroid Concerns  Depressed Mood  Grief  Weight Gain 33 year old patient, presents with concerns about her thyroid due to her family history of thyroid issues.  -Both her sisters have been diagnosed with thyroid issues, one with Graves' disease, and both have had their thyroids removed.  -She also reports issues with her blood pressure, which has been out of control.  -She has a history of being put on medication for high blood pressure during her pregnancies, but she has stopped taking the medication, believing it was under control.  -However, she has been experiencing migraines, which she believes are linked to her blood pressure.  The patient has been dealing with stress and depression following the unexpected death of her mother a year and a half ago. She reports low energy, which she attributes to her depression. She also reports weight gain and night sweats, which she has noticed more since her mother's death. She has gained about 30 pounds since her mother's death and is seeking options for weight loss.  She reports eating only once a day, which she believes has slowed her metabolism. She is considering medication options for weight loss but is concerned about potential side effects. She has ordered a treadmill to increase her physical activity.  PERTINENT  PMH / PSH: Reviewed   OBJECTIVE:   BP (!) 177/118   Pulse 81   Ht 5\' 4"  (1.626 m)   Wt (!) 306 lb (138.8 kg)   LMP 03/23/2023   SpO2 100%   BMI 52.52 kg/m   General: Alert and oriented in no apparent distress Heart: Regular rate and rhythm with no murmurs appreciated Head: [place]/AT.   Eyes:  EOMI Ears:  External ears WNL Nose:  Septum midline  Mouth:  MMM Thyroid without obvious nodularity or enlargement  Lungs: Normal WOB Abdomen: no abdominal pain Skin: Warm and dry   ASSESSMENT/PLAN:   Assessment & Plan BMI 40.0-44.9, adult (HCC) Weight gain of 30 pounds since  bereavement. Current eating habits may slow metabolism. Discussed medications for weight loss, noting risks and benefits. - Discuss dietary changes to include 5-6 small meals a day. - Encourage treadmill exercise. - [facility]. - Discuss potential use of medication - keep food diary before next visit Hypertension, unspecified type Poorly controlled hypertension with significant elevation at 178/115 mmHg, likely exacerbated by stress. Symptoms include migraines, nausea, and light sensitivity.  - return in 1 month  - take pressures at home (has cuff) and log  - Initiate antihypertensive medication-Duo  - UTD BMP now and in 3-4 weeks    Due for Pap smear and comprehensive lab work to assess overall health. - Schedule Pap smear for next visit. - Order comprehensive lab work including blood counts, liver, kidney, and glucose levels.  Alfredo Martinez, MD [facility]

## 2023-03-31 NOTE — Patient Instructions (Addendum)
 It was great to see you today! Thank you for choosing [facility] for your primary care.  Today we addressed: - Start the medication I have ordered for you  - We have ordered labs for you  - Return in 3 weeks    Blood Pressure Record Sheet To take your blood pressure, you will need a blood pressure machine. You can buy a blood pressure machine (blood pressure monitor) at your clinic, drug store, or online. When choosing one, consider: An automatic monitor that has an arm cuff. A cuff that wraps snugly around your upper arm. You should be able to fit only one finger between your arm and the cuff. A device that stores blood pressure reading results. Do not choose a monitor that measures your blood pressure from your wrist or finger. Follow your health care provider's instructions for how to take your blood pressure. To use this form: Take your blood pressure medications every day These measurements should be taken when you have been at rest for at least 10-15 min Take at least 2 readings with each blood pressure check. This makes sure the results are correct. Wait 1-2 minutes between measurements. Write down the results in the spaces on this form. Keep in mind it should always be recorded systolic over diastolic. Both numbers are important.  Repeat this every day for 2-3 weeks, or as told by your health care provider.  Make a follow-up appointment with your health care provider to discuss the results.  Blood Pressure Log Date Medications taken? (Y/N) Blood Pressure Time of Day                                                                                                         If you haven't already, sign up for My Chart to have easy access to your labs results, and communication with your primary care physician. I recommend that you always bring your medications to each appointment as this makes it easy to ensure you are on the correct medications and helps  [place] not miss refills when you need them. Call the clinic at [phone] if your symptoms worsen or you have any concerns.  Follow up 4 weeks  Please arrive 15 minutes before your appointment to ensure smooth check in process.  We appreciate your efforts in making this happen.  Thank you for allowing me to participate in your care, Alfredo Martinez, MD 03/31/2023, 9:38 AM PGY-3, [facility]

## 2023-04-01 ENCOUNTER — Encounter: Payer: Self-pay | Admitting: Student

## 2023-04-01 LAB — COMPREHENSIVE METABOLIC PANEL WITH GFR
ALT: 12 IU/L (ref 0–32)
AST: 14 IU/L (ref 0–40)
Albumin: 4 g/dL (ref 3.9–4.9)
Alkaline Phosphatase: 130 IU/L — ABNORMAL HIGH (ref 44–121)
BUN/Creatinine Ratio: 12 (ref 9–23)
BUN: 10 mg/dL (ref 6–20)
Bilirubin Total: 0.4 mg/dL (ref 0.0–1.2)
CO2: 22 mmol/L (ref 20–29)
Calcium: 9.8 mg/dL (ref 8.7–10.2)
Chloride: 101 mmol/L (ref 96–106)
Creatinine, Ser: 0.85 mg/dL (ref 0.57–1.00)
Globulin, Total: 3.6 g/dL (ref 1.5–4.5)
Glucose: 82 mg/dL (ref 70–99)
Potassium: 4.6 mmol/L (ref 3.5–5.2)
Sodium: 139 mmol/L (ref 134–144)
Total Protein: 7.6 g/dL (ref 6.0–8.5)
eGFR: 93 mL/min/{1.73_m2} (ref >59)

## 2023-04-01 LAB — CBC WITH DIFFERENTIAL/PLATELET
Basophils Absolute: 0 10*3/uL (ref 0.0–0.2)
Basos: 0 %
EOS (ABSOLUTE): 0.1 10*3/uL (ref 0.0–0.4)
Eos: 1 %
Hematocrit: 39 % (ref 34.0–46.6)
Hemoglobin: 12.7 g/dL (ref 11.1–15.9)
Immature Grans (Abs): 0 10*3/uL (ref 0.0–0.1)
Immature Granulocytes: 0 %
Lymphocytes Absolute: 2.5 10*3/uL (ref 0.7–3.1)
Lymphs: 43 %
MCH: 29.2 pg (ref 26.6–33.0)
MCHC: 32.6 g/dL (ref 31.5–35.7)
MCV: 90 fL (ref 79–97)
Monocytes Absolute: 0.3 10*3/uL (ref 0.1–0.9)
Monocytes: 5 %
Neutrophils Absolute: 2.9 10*3/uL (ref 1.4–7.0)
Neutrophils: 51 %
Platelets: 322 10*3/uL (ref 150–450)
RBC: 4.35 x10E6/uL (ref 3.77–5.28)
RDW: 12.6 % (ref 11.7–15.4)
WBC: 5.8 10*3/uL (ref 3.4–10.8)

## 2023-04-01 LAB — TSH RFX ON ABNORMAL TO FREE T4: TSH: 0.711 u[IU]/mL (ref 0.450–4.500)

## 2023-04-30 ENCOUNTER — Ambulatory Visit: Admitting: Student

## 2023-04-30 ENCOUNTER — Encounter: Payer: Self-pay | Admitting: Student

## 2023-04-30 VITALS — BP 148/102 | HR 61 | Ht 64.0 in | Wt 308.2 lb

## 2023-04-30 DIAGNOSIS — O10919 Unspecified pre-existing hypertension complicating pregnancy, unspecified trimester: Secondary | ICD-10-CM

## 2023-04-30 DIAGNOSIS — R4586 Emotional lability: Secondary | ICD-10-CM | POA: Diagnosis present

## 2023-04-30 MED ORDER — AMLODIPINE-OLMESARTAN 5-40 MG PO TABS: 1.0000 | ORAL_TABLET | Freq: Every day | ORAL | 2 refills | Status: DC

## 2023-04-30 NOTE — Patient Instructions (Addendum)
 It was great to see you today! Thank you for choosing [facility] for your primary care.   Today we addressed:  - Increase blood pressure medication - Check pressure at home  - Try journaling and taking time for yourself    Therapy and Counseling Resources Most providers on this list will take Medicaid. Patients with commercial insurance or Medicare should contact their insurance company to get a list of in network providers.  [facility] (spanish speaking therapist available)(habla espanol)(take medicare and medicaid)  [address], [place], [place] [address], [place]  [email or web address] [phone]  [facility] [address] [place], [place] [address] [phone]  [facility]   [address] [place], [place] [address]      [phone]  [facility] (spanish available) [address]  [place], [place] [address] [phone]  [facility] (take [facility] and medicare) [address], [address]  [place], [place] [address]       [phone]     [facility] (virtual only) [phone]  [facility] [address], [place], [place] [phone]  [facility]:  [address] [place] [phone]  [facility]:  [address] [place] [phone]  [facility] (under & uninsured) [address]   [place] [phone]    [email or web address]    [facility] [address]  [place]    [phone]  [facility]  [facility] -[address] [place] [address]     Phone:  [phone]     [facility]-  [address]  [phone]   [facility] [address]      [place], [place] [phone]  [facility]: [address] [place], [place], [place]  [phone]   [facility] (Spanish  therapist) [email or web address]  [address] [place]  [address]   [place] [address]    [phone]    [facility]   [address],  [place], [place]  [phone]   [facility]  [address] [place]  [phone]  [facility]  [address] [place], [place]        [phone]  Open Access/Walk In Clinic under & uninsured  [facility]  [address] [place], [place] [phone] Crisis [phone]  [facility] [address],  (Spanish)   [address] , [place]: [phone]) 8:30 - 12; 1 - 2:30  [facility] [employer],  [address] [place], [place]    [phone]):8:30 - 12; 2 - 3PM  [facility] [employer],  [address]  [place]; [phone]):   Mon - Fri 8 AM - 5 PM  [facility] [address] [place]  MWF 12:30 to 3:00 or call to schedule an appointment  [phone]  Specific Provider options Psychology Today  [email or web address].psychologytoday.com/us  click on find a therapist  enter your zip code left side and select or tailor a therapist for your specific need.   [facility] Provider Directory [email or web address]  (Medicaid)   Follow all drop down to find a provider  Social Support program [facility] [place] [phone] or PhotoSolver.pl [address], [place], [place] Recovery support and educational   24- Hour Availability:   [facility]  [address] [place], [place] [phone] Crisis [phone]  [facility] [phone]  [facility]  [phone]   [facility]  [phone] (after hours)  Therapeutic Alternative/Mobile Crisis   [phone]  [place]  National Suicide Hotline  4373223594 Derrel Flies)  Call 911 or go to emergency room  [facility]  [phone]);  [place] and [employer]  [phone]); [place], [place], [place], [place], [place], [place], [place]   If you haven't already, sign up for My Chart to have easy access to your labs results, and communication with your primary care physician.   Please arrive 15 minutes before your appointment to ensure smooth check in process.  We appreciate your efforts in making this happen.  Thank you for allowing me to participate in your care, Ernestina Headland, MD 04/30/2023, 9:17 AM PGY-3, [facility]

## 2023-04-30 NOTE — Progress Notes (Signed)
    SUBJECTIVE:   CHIEF COMPLAINT / HPI:   HTN:  - Follow up from visit on 03/31/23 - Started on Almodipine-olmesartan  - Takes fairly consistently but has missed a couple of doses -No leg swelling or headaches - No chest pain or shortness of breath - Has not checked at home but does have a cuff  Depressed Mood: Intermittently depressed mood, has been ongoing since her mom passed away. Reports she may be interested in therapeutic options for discussion Asking about other ways to help with her stress especially with a young child at home  PERTINENT  PMH / PSH: HTN BMI 40-44.9   OBJECTIVE:   Pulse 61   Ht 5\' 4"  (1.626 m)   Wt (!) 308 lb 3.2 oz (139.8 kg)   LMP 04/21/2023   SpO2 100%   BMI 52.90 kg/m   General: Alert and oriented in no apparent distress Heart: Regular rate and rhythm with no murmurs appreciated Lungs: Normal wob Abdomen: no abdominal pain Skin: Warm and dry   ASSESSMENT/PLAN:   Assessment & Plan Chronic hypertension affecting pregnancy BP: (!) 146/97 today. Poorly controlled. Continue to work on healthy dietary habits and exercise. Follow up in 4 weeks Has cuff at home, encouraged checking  No lab here today, lab appt for CMP ordered, future lab ordered Medication regimen: Amlodipine -olmesartan  increase to 5-40 mg  Mood change Intermittently stressed, can have depressed mood after mom passed. Consider GAD 7 next visit with PHQ. Therapy options given. Encourage journaling and self care days. Support system generally strong. No HI/SI.       Ernestina Headland, MD [facility]

## 2023-04-30 NOTE — Assessment & Plan Note (Signed)
 BP: (!) 146/97 today. Poorly controlled. Continue to work on healthy dietary habits and exercise. Follow up in 4 weeks Has cuff at home, encouraged checking  No lab here today, lab appt for CMP ordered, future lab ordered Medication regimen: Amlodipine -olmesartan  increase to 5-40 mg

## 2023-05-03 ENCOUNTER — Other Ambulatory Visit: Payer: Self-pay

## 2023-05-03 DIAGNOSIS — O10919 Unspecified pre-existing hypertension complicating pregnancy, unspecified trimester: Secondary | ICD-10-CM

## 2023-05-04 ENCOUNTER — Encounter: Payer: Self-pay | Admitting: Student

## 2023-05-04 LAB — COMPREHENSIVE METABOLIC PANEL WITH GFR
ALT: 14 IU/L (ref 0–32)
AST: 9 IU/L (ref 0–40)
Albumin: 4 g/dL (ref 3.9–4.9)
Alkaline Phosphatase: 127 IU/L — ABNORMAL HIGH (ref 44–121)
BUN/Creatinine Ratio: 11 (ref 9–23)
BUN: 9 mg/dL (ref 6–20)
Bilirubin Total: 0.4 mg/dL (ref 0.0–1.2)
CO2: 22 mmol/L (ref 20–29)
Calcium: 9.8 mg/dL (ref 8.7–10.2)
Chloride: 103 mmol/L (ref 96–106)
Creatinine, Ser: 0.79 mg/dL (ref 0.57–1.00)
Globulin, Total: 3.7 g/dL (ref 1.5–4.5)
Glucose: 134 mg/dL — ABNORMAL HIGH (ref 70–99)
Potassium: 4.7 mmol/L (ref 3.5–5.2)
Sodium: 138 mmol/L (ref 134–144)
Total Protein: 7.7 g/dL (ref 6.0–8.5)
eGFR: 101 mL/min/{1.73_m2} (ref >59)

## 2023-05-25 ENCOUNTER — Encounter: Payer: Self-pay | Admitting: Family Medicine

## 2023-05-25 ENCOUNTER — Ambulatory Visit (INDEPENDENT_AMBULATORY_CARE_PROVIDER_SITE_OTHER): Admitting: Family Medicine

## 2023-05-25 VITALS — BP 130/85 | HR 88 | Ht 64.5 in | Wt 308.2 lb

## 2023-05-25 DIAGNOSIS — Z0000 Encounter for general adult medical examination without abnormal findings: Secondary | ICD-10-CM

## 2023-05-25 DIAGNOSIS — R748 Abnormal levels of other serum enzymes: Secondary | ICD-10-CM

## 2023-05-25 DIAGNOSIS — I10 Essential (primary) hypertension: Secondary | ICD-10-CM | POA: Diagnosis present

## 2023-05-25 DIAGNOSIS — T7840XA Allergy, unspecified, initial encounter: Secondary | ICD-10-CM

## 2023-05-25 DIAGNOSIS — E559 Vitamin D deficiency, unspecified: Secondary | ICD-10-CM | POA: Diagnosis not present

## 2023-05-25 NOTE — Patient Instructions (Addendum)
 Continue your current blood pressure pill. Come back in 1 month for recheck of blood pressure and headaches with PCP. Come back before then if symptoms are worsening.  We obtained vitamin D  levels today. I will follow these up with you.  For your dizziness and headaches, be sure to stay hydrated like you have been. Try warm compresses on your neck. Take Zyrtec like you have been as well as Flonase (this is over the counter). You should also take Debrox drops (also over the counter) for your ear wax.

## 2023-05-25 NOTE — Progress Notes (Signed)
    SUBJECTIVE:   CHIEF COMPLAINT / HPI:   HTN Previously seen 04/30/23 where BP was uncontrolled. She was increased to amlodipine -olmesartan  5-40 mg daily and encouraged to take BP at home. Recent CMP with normal creatinine and potassium.  Elevated alk phos On multiple CMPs. Has history of vitamin D  deficiency. Does have a history of "spasms" of the RUQ for which she has had extensive workup in the past and has her gallbladder to removed.  Dizziness, headaches Happened yesterday. Felt the room was spinning. The dizziness went away after 3 hours but the headache was still there. She woke up the next day and continued to feel the headache.  Allergies have improved with Zyrtec, though she continues to still experience some congestion and sneezing. Does feel more stressed with neck tension, though usually can take a tylenol  to help with these headaches. She has been trying to stay more hydrated. Husband had vasectomy after last baby so she does not think she is pregnant.  PERTINENT  PMH / PSH: chronic HA  OBJECTIVE:   BP 130/85   Pulse 88   Ht 5' 4.5" (1.638 m)   Wt (!) 308 lb 3.2 oz (139.8 kg)   LMP 05/19/2023   SpO2 95%   BMI 52.09 kg/m   General: Alert and oriented, in NAD Skin: Warm, dry, and intact without lesions HEENT: NCAT, EOM grossly normal, midline nasal septum, left TM with minor clear fluid, right TM difficult to see due to cerumen burden Cardiac: RRR, no m/r/g appreciated Respiratory: CTAB, breathing and speaking comfortably on RA Abdominal: Soft, nontender, nondistended, normoactive bowel sounds Extremities: Moves all extremities grossly equally Neurological: No gross focal deficit, cranial nerves II through XII intact, strength sensation intact throughout Psychiatric: Appropriate mood and affect   ASSESSMENT/PLAN:   Assessment & Plan Primary hypertension At goal with amlodipine -olmesartan  5-40 mg daily.  Continue current management for now. Elevated alkaline  phosphatase level As noticed on recent CMPs.  She has a history of low vitamin D  which could be contributing.  She has had extensive workup for her chronic abdominal pain and has had a cholecystectomy; therefore, feel biliary source is less likely at this time, though a retained stone could lead to elevation.  Recheck vitamin D  today and supplement if persist low.  Consider repeat CMP after supplementation. Allergy, initial encounter Likely contributor to intermittent dizziness and headaches, especially with evidence of mild fluid behind left TM and history of allergies.  Reassuringly normal neurologic exam.  Will add on Flonase to Zyrtec.  Advised to let me know if these recur. Healthcare maintenance Discussed returning to obtain Pap smear with PCP when she is able.   Genetta Kenning, MD [facility]

## 2023-05-26 ENCOUNTER — Ambulatory Visit: Payer: Self-pay | Admitting: Family Medicine

## 2023-05-26 DIAGNOSIS — R748 Abnormal levels of other serum enzymes: Secondary | ICD-10-CM | POA: Insufficient documentation

## 2023-05-26 LAB — VITAMIN D 25 HYDROXY (VIT D DEFICIENCY, FRACTURES): Vit D, 25-Hydroxy: 27.3 ng/mL — ABNORMAL LOW (ref 30.0–100.0)

## 2023-05-26 MED ORDER — VITAMIN D3 20 MCG (800 UNIT) PO TABS: 1.0000 | ORAL_TABLET | Freq: Every day | ORAL | 0 refills | Status: AC

## 2023-05-26 NOTE — Assessment & Plan Note (Signed)
 Discussed returning to obtain Pap smear with PCP when she is able.

## 2023-05-26 NOTE — Assessment & Plan Note (Signed)
 As noticed on recent CMPs.  She has a history of low vitamin D  which could be contributing.  She has had extensive workup for her chronic abdominal pain and has had a cholecystectomy; therefore, feel biliary source is less likely at this time, though a retained stone could lead to elevation.  Recheck vitamin D  today and supplement if persist low.  Consider repeat CMP after supplementation.

## 2023-08-03 ENCOUNTER — Other Ambulatory Visit: Payer: Self-pay

## 2023-08-03 MED ORDER — AMLODIPINE-OLMESARTAN 5-40 MG PO TABS: 1.0000 | ORAL_TABLET | Freq: Every day | ORAL | 1 refills | Status: AC

## 2023-09-14 DIAGNOSIS — S99921A Unspecified injury of right foot, initial encounter: Secondary | ICD-10-CM | POA: Diagnosis not present

## 2023-09-14 DIAGNOSIS — T1490XA Injury, unspecified, initial encounter: Secondary | ICD-10-CM | POA: Diagnosis not present

## 2023-12-23 ENCOUNTER — Ambulatory Visit: Admitting: Family Medicine

## 2023-12-23 VITALS — BP 132/75 | HR 79 | Temp 98.7°F | Ht 65.5 in | Wt 317.4 lb

## 2023-12-23 DIAGNOSIS — E559 Vitamin D deficiency, unspecified: Secondary | ICD-10-CM | POA: Diagnosis not present

## 2023-12-23 DIAGNOSIS — R109 Unspecified abdominal pain: Secondary | ICD-10-CM | POA: Diagnosis not present

## 2023-12-23 DIAGNOSIS — R0789 Other chest pain: Secondary | ICD-10-CM | POA: Diagnosis present

## 2023-12-23 DIAGNOSIS — R748 Abnormal levels of other serum enzymes: Secondary | ICD-10-CM

## 2023-12-23 MED ORDER — DICLOFENAC SODIUM 1 % EX GEL: 4.0000 g | Freq: Four times a day (QID) | CUTANEOUS | 1 refills | Status: AC | PRN

## 2023-12-23 NOTE — Patient Instructions (Addendum)
 VISIT SUMMARY: You visited us  today due to abdominal pain and muscle spasms that have persisted since your cholecystectomy 13 years ago. We discussed your symptoms, current medications, and made some adjustments to help manage your pain and overall health.  YOUR PLAN: ABDOMINAL PAIN AND COSTOCHONDRITIS: You have chronic abdominal pain that has worsened recently, possibly due to costochondritis. -Start taking tizanidine for muscle relaxation, initially at bedtime due to possible sedation. -Avoid driving or operating heavy machinery until you know how tizanidine affects you. -Use diclofenac  gel topically to help with inflammation and pain. -We ordered blood tests to check your vitamin D , liver function, electrolytes, and kidney function.  VITAMIN D  DEFICIENCY: You have a known vitamin D  deficiency and are on supplements. -We ordered a blood test to check your vitamin D  levels.  ELEVATED ALKALINE PHOSPHATASE LEVEL: You have had elevated alkaline phosphatase levels, which may be related to your vitamin D  deficiency. -We ordered liver function tests to reassess your alkaline phosphatase levels.  Please let me know if you have any other questions.  Dr. Tharon

## 2023-12-23 NOTE — Progress Notes (Signed)
° °  SUBJECTIVE:   CHIEF COMPLAINT / HPI:  Discussed the use of AI scribe software for clinical note transcription with the patient, who gave verbal consent to proceed.  History of Present Illness Carol Blair is a 33 year old female who presents with abdominal pain and muscle spasms post-cholecystectomy.  Abdominal pain and muscle spasms - Sharp, sporadic right-sided abdominal pain with associated muscle spasms and skin sensitivity since cholecystectomy 13 years ago - Pain occurs daily and worsens with touch, hunger, and physical activity - Recent onset of aching pain on the left side of the abdomen - Pain became more intense and persistent after working and cooking a lot on Thanksgiving - Occasional relief with drinking water - Avoiding fatty foods has not improved symptoms  Symptom management and medications - Uses Tylenol  or ibuprofen  as needed for pain - Has not tried muscle relaxers or topical treatments - Takes vitamins, collagen, and antihypertensive medication  Associated and constitutional symptoms - No nausea, vomiting, or fever - No change from usual post-cholecystectomy bowel pattern  PERTINENT  PMH / PSH: s/p cholecystectomy, chronic abdominal pain  OBJECTIVE:  BP 132/75   Pulse 79   Temp 98.7 F (37.1 C)   Ht 5' 5.5 (1.664 m)   Wt (!) 317 lb 6.4 oz (144 kg)   SpO2 92%   BMI 52.01 kg/m   Physical Exam GENERAL: Alert, cooperative, well developed, no acute distress. HEENT: Normocephalic, normal oropharynx, moist mucous membranes. CHEST: Breathing and speaking comfortably on RA ABDOMEN/LOWER CHEST WALL: Mild tenderness on palpation of the bilateral lower chest wall overlying rib spaces; abdomen otherwise soft, non-distended, without organomegaly, normal bowel sounds. NEUROLOGICAL: Cranial nerves grossly intact, moves all extremities without gross motor or sensory deficit. SKIN: No rashes on the abdomen.  ASSESSMENT/PLAN:   Assessment & Plan Chest wall  pain More TTP of the left and right chest wall on my exam. Appears to have worsened after increase in activity over the holidays. Vitals stable. - Prescribed tizanidine for muscle relaxation, advised initial bedtime use due to sedation risk. - Advised against driving or operating heavy machinery until tizanidine effects are known. - Recommended diclofenac  gel topically for inflammation and pain. - Return to care if not improving. Vitamin D  deficiency Elevated alkaline phosphatase level Abdominal pain, unspecified abdominal location Will recheck vitamin D  labs today given she has been supplementing; feel this could be cause of elevated alk phos. As previously discussed at last visit, feel biliary source of her pain is less likely, especially with point MSK tenderness as above. However, if right-sided chest wall pain persists despite treatment above could consider investigation into impacted biliary stone s/p chole. Will also update CBC and CMP today.  Stuart Redo, MD [facility]

## 2023-12-23 NOTE — Assessment & Plan Note (Addendum)
 Will recheck vitamin D  labs today given she has been supplementing; feel this could be cause of elevated alk phos. As previously discussed at last visit, feel biliary source of her pain is less likely, especially with point MSK tenderness as above. However, if right-sided chest wall pain persists despite treatment above could consider investigation into impacted biliary stone s/p chole. Will also update CBC and CMP today.

## 2023-12-24 ENCOUNTER — Ambulatory Visit: Payer: Self-pay | Admitting: Family Medicine

## 2023-12-24 LAB — CBC WITH DIFFERENTIAL/PLATELET
Basophils Absolute: 0 x10E3/uL (ref 0.0–0.2)
Basos: 0 %
EOS (ABSOLUTE): 0 x10E3/uL (ref 0.0–0.4)
Eos: 1 %
Hematocrit: 37.3 % (ref 34.0–46.6)
Hemoglobin: 12 g/dL (ref 11.1–15.9)
Immature Grans (Abs): 0 x10E3/uL (ref 0.0–0.1)
Immature Granulocytes: 0 %
Lymphocytes Absolute: 2.7 x10E3/uL (ref 0.7–3.1)
Lymphs: 48 %
MCH: 29.6 pg (ref 26.6–33.0)
MCHC: 32.2 g/dL (ref 31.5–35.7)
MCV: 92 fL (ref 79–97)
Monocytes Absolute: 0.3 x10E3/uL (ref 0.1–0.9)
Monocytes: 6 %
Neutrophils Absolute: 2.5 x10E3/uL (ref 1.4–7.0)
Neutrophils: 45 %
Platelets: 301 x10E3/uL (ref 150–450)
RBC: 4.06 x10E6/uL (ref 3.77–5.28)
RDW: 13.2 % (ref 11.7–15.4)
WBC: 5.6 x10E3/uL (ref 3.4–10.8)

## 2023-12-24 LAB — COMPREHENSIVE METABOLIC PANEL WITH GFR
ALT: 15 IU/L (ref 0–32)
AST: 14 IU/L (ref 0–40)
Albumin: 4.1 g/dL (ref 3.9–4.9)
Alkaline Phosphatase: 121 IU/L — ABNORMAL HIGH (ref 41–116)
BUN/Creatinine Ratio: 8 — ABNORMAL LOW (ref 9–23)
BUN: 7 mg/dL (ref 6–20)
Bilirubin Total: 0.4 mg/dL (ref 0.0–1.2)
CO2: 23 mmol/L (ref 20–29)
Calcium: 9.5 mg/dL (ref 8.7–10.2)
Chloride: 101 mmol/L (ref 96–106)
Creatinine, Ser: 0.87 mg/dL (ref 0.57–1.00)
Globulin, Total: 3.4 g/dL (ref 1.5–4.5)
Glucose: 96 mg/dL (ref 70–99)
Potassium: 4.4 mmol/L (ref 3.5–5.2)
Sodium: 136 mmol/L (ref 134–144)
Total Protein: 7.5 g/dL (ref 6.0–8.5)
eGFR: 90 mL/min/1.73

## 2023-12-24 LAB — VITAMIN D 25 HYDROXY (VIT D DEFICIENCY, FRACTURES): Vit D, 25-Hydroxy: 36.3 ng/mL (ref 30.0–100.0)
